# Patient Record
Sex: Male | Born: 1935 | Race: White | Hispanic: No | Marital: Married | Smoking: Former smoker
Health system: Southern US, Community
[De-identification: ages and names within clinical notes are randomized; demographics above are authoritative.]

## PROBLEM LIST (undated history)

## (undated) DIAGNOSIS — I2510 Atherosclerotic heart disease of native coronary artery without angina pectoris: Secondary | ICD-10-CM

## (undated) DIAGNOSIS — G629 Polyneuropathy, unspecified: Secondary | ICD-10-CM

## (undated) DIAGNOSIS — I350 Nonrheumatic aortic (valve) stenosis: Secondary | ICD-10-CM

## (undated) DIAGNOSIS — I447 Left bundle-branch block, unspecified: Secondary | ICD-10-CM

## (undated) DIAGNOSIS — I6529 Occlusion and stenosis of unspecified carotid artery: Secondary | ICD-10-CM

## (undated) DIAGNOSIS — E785 Hyperlipidemia, unspecified: Secondary | ICD-10-CM

## (undated) DIAGNOSIS — I701 Atherosclerosis of renal artery: Secondary | ICD-10-CM

## (undated) DIAGNOSIS — Z85819 Personal history of malignant neoplasm of unspecified site of lip, oral cavity, and pharynx: Secondary | ICD-10-CM

## (undated) DIAGNOSIS — E039 Hypothyroidism, unspecified: Secondary | ICD-10-CM

## (undated) DIAGNOSIS — I509 Heart failure, unspecified: Secondary | ICD-10-CM

## (undated) DIAGNOSIS — R0602 Shortness of breath: Secondary | ICD-10-CM

## (undated) DIAGNOSIS — D649 Anemia, unspecified: Secondary | ICD-10-CM

## (undated) DIAGNOSIS — K5790 Diverticulosis of intestine, part unspecified, without perforation or abscess without bleeding: Secondary | ICD-10-CM

## (undated) DIAGNOSIS — I10 Essential (primary) hypertension: Secondary | ICD-10-CM

## (undated) DIAGNOSIS — Z7901 Long term (current) use of anticoagulants: Secondary | ICD-10-CM

## (undated) DIAGNOSIS — C140 Malignant neoplasm of pharynx, unspecified: Secondary | ICD-10-CM

## (undated) DIAGNOSIS — H9190 Unspecified hearing loss, unspecified ear: Secondary | ICD-10-CM

## (undated) DIAGNOSIS — K449 Diaphragmatic hernia without obstruction or gangrene: Secondary | ICD-10-CM

## (undated) DIAGNOSIS — I4892 Unspecified atrial flutter: Secondary | ICD-10-CM

## (undated) DIAGNOSIS — I219 Acute myocardial infarction, unspecified: Secondary | ICD-10-CM

## (undated) DIAGNOSIS — K219 Gastro-esophageal reflux disease without esophagitis: Secondary | ICD-10-CM

## (undated) DIAGNOSIS — K635 Polyp of colon: Secondary | ICD-10-CM

## (undated) DIAGNOSIS — I739 Peripheral vascular disease, unspecified: Secondary | ICD-10-CM

## (undated) DIAGNOSIS — I4891 Unspecified atrial fibrillation: Secondary | ICD-10-CM

## (undated) DIAGNOSIS — I255 Ischemic cardiomyopathy: Secondary | ICD-10-CM

## (undated) HISTORY — PX: TONSILLECTOMY: SUR1361

## (undated) HISTORY — PX: TRACHEOSTOMY: SUR1362

## (undated) HISTORY — PX: CARDIAC CATHETERIZATION: SHX172

## (undated) HISTORY — DX: Acute myocardial infarction, unspecified: I21.9

## (undated) HISTORY — PX: CORONARY ARTERY BYPASS GRAFT: SHX141

## (undated) HISTORY — DX: Peripheral vascular disease, unspecified: I73.9

## (undated) HISTORY — PX: RENAL ARTERY STENT: SHX2321

## (undated) HISTORY — PX: APPENDECTOMY: SHX54

---

## 1999-05-31 ENCOUNTER — Other Ambulatory Visit: Admission: RE | Admit: 1999-05-31 | Discharge: 1999-05-31 | Payer: Self-pay | Admitting: Urology

## 2000-10-04 ENCOUNTER — Ambulatory Visit (HOSPITAL_COMMUNITY): Admission: RE | Admit: 2000-10-04 | Discharge: 2000-10-04 | Payer: Self-pay | Admitting: Gastroenterology

## 2003-04-06 ENCOUNTER — Ambulatory Visit (HOSPITAL_COMMUNITY): Admission: RE | Admit: 2003-04-06 | Discharge: 2003-04-06 | Payer: Self-pay | Admitting: Ophthalmology

## 2003-04-14 ENCOUNTER — Ambulatory Visit (HOSPITAL_COMMUNITY): Admission: RE | Admit: 2003-04-14 | Discharge: 2003-04-14 | Payer: Self-pay | Admitting: Ophthalmology

## 2006-02-05 ENCOUNTER — Emergency Department (HOSPITAL_COMMUNITY): Admission: EM | Admit: 2006-02-05 | Discharge: 2006-02-06 | Payer: Self-pay | Admitting: Emergency Medicine

## 2006-02-11 ENCOUNTER — Inpatient Hospital Stay (HOSPITAL_COMMUNITY): Admission: EM | Admit: 2006-02-11 | Discharge: 2006-02-15 | Payer: Self-pay | Admitting: Emergency Medicine

## 2006-02-26 ENCOUNTER — Emergency Department (HOSPITAL_COMMUNITY): Admission: EM | Admit: 2006-02-26 | Discharge: 2006-02-26 | Payer: Self-pay | Admitting: Emergency Medicine

## 2006-03-21 ENCOUNTER — Ambulatory Visit (HOSPITAL_COMMUNITY): Admission: RE | Admit: 2006-03-21 | Discharge: 2006-03-21 | Payer: Self-pay | Admitting: Emergency Medicine

## 2006-04-17 ENCOUNTER — Ambulatory Visit: Payer: Self-pay | Admitting: Pulmonary Disease

## 2006-04-17 ENCOUNTER — Encounter: Admission: RE | Admit: 2006-04-17 | Discharge: 2006-04-17 | Payer: Self-pay | Admitting: Emergency Medicine

## 2006-10-21 ENCOUNTER — Inpatient Hospital Stay (HOSPITAL_COMMUNITY): Admission: RE | Admit: 2006-10-21 | Discharge: 2006-10-22 | Payer: Self-pay | Admitting: Interventional Cardiology

## 2006-11-26 ENCOUNTER — Ambulatory Visit: Payer: Self-pay | Admitting: Vascular Surgery
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
## 2008-12-25 ENCOUNTER — Inpatient Hospital Stay (HOSPITAL_COMMUNITY): Admission: EM | Admit: 2008-12-25 | Discharge: 2008-12-28 | Payer: Self-pay | Admitting: Internal Medicine

## 2009-05-23 ENCOUNTER — Encounter: Admission: RE | Admit: 2009-05-23 | Discharge: 2009-05-23 | Payer: Self-pay | Admitting: Interventional Cardiology

## 2010-01-07 ENCOUNTER — Emergency Department (HOSPITAL_COMMUNITY): Admission: EM | Admit: 2010-01-07 | Discharge: 2010-01-07 | Payer: Self-pay | Admitting: Emergency Medicine

## 2010-09-02 LAB — COMPREHENSIVE METABOLIC PANEL
ALT: 20 U/L (ref 0–53)
Alkaline Phosphatase: 44 U/L (ref 39–117)
CO2: 26 mEq/L (ref 19–32)
Calcium: 8.9 mg/dL (ref 8.4–10.5)
Chloride: 101 mEq/L (ref 96–112)
GFR calc non Af Amer: >60 mL/min (ref >60)
Glucose, Bld: 137 mg/dL — ABNORMAL HIGH (ref 70–99)
Potassium: 3.9 mEq/L (ref 3.5–5.1)
Sodium: 135 mEq/L (ref 135–145)
Total Bilirubin: 0.5 mg/dL (ref 0.3–1.2)

## 2010-09-02 LAB — CBC
Hemoglobin: 11.6 g/dL — ABNORMAL LOW (ref 13.0–17.0)
MCH: 32 pg (ref 26.0–34.0)
MCHC: 34.3 g/dL (ref 30.0–36.0)
MCV: 93.4 fL (ref 78.0–100.0)

## 2010-09-02 LAB — D-DIMER, QUANTITATIVE: D-Dimer, Quant: 0.39 ug/mL-FEU (ref 0.00–0.48)

## 2010-09-02 LAB — DIFFERENTIAL
Basophils Absolute: 0.2 10*3/uL — ABNORMAL HIGH (ref 0.0–0.1)
Basophils Relative: 2 % — ABNORMAL HIGH (ref 0–1)
Eosinophils Absolute: 0.1 10*3/uL (ref 0.0–0.7)
Eosinophils Relative: 1 % (ref 0–5)
Neutrophils Relative %: 73 % (ref 43–77)

## 2010-09-02 LAB — TROPONIN I: Troponin I: 0.01 ng/mL (ref 0.00–0.06)

## 2010-09-02 LAB — CK TOTAL AND CKMB (NOT AT ARMC): Relative Index: INVALID (ref 0.0–2.5)

## 2010-09-24 LAB — BASIC METABOLIC PANEL
BUN: 26 mg/dL — ABNORMAL HIGH (ref 6–23)
CO2: 26 mEq/L (ref 19–32)
Calcium: 8 mg/dL — ABNORMAL LOW (ref 8.4–10.5)
Calcium: 8.4 mg/dL (ref 8.4–10.5)
Chloride: 104 mEq/L (ref 96–112)
Creatinine, Ser: 1.03 mg/dL (ref 0.4–1.5)
Creatinine, Ser: 1.05 mg/dL (ref 0.4–1.5)
GFR calc Af Amer: >60 mL/min (ref >60)
GFR calc Af Amer: >60 mL/min (ref >60)
GFR calc non Af Amer: >60 mL/min (ref >60)
Glucose, Bld: 105 mg/dL — ABNORMAL HIGH (ref 70–99)
Potassium: 4.2 mEq/L (ref 3.5–5.1)
Sodium: 140 mEq/L (ref 135–145)

## 2010-09-24 LAB — COMPREHENSIVE METABOLIC PANEL
AST: 29 U/L (ref 0–37)
Albumin: 3.4 g/dL — ABNORMAL LOW (ref 3.5–5.2)
Alkaline Phosphatase: 38 U/L — ABNORMAL LOW (ref 39–117)
BUN: 20 mg/dL (ref 6–23)
CO2: 27 mEq/L (ref 19–32)
Chloride: 100 mEq/L (ref 96–112)
Creatinine, Ser: 1.04 mg/dL (ref 0.4–1.5)
GFR calc non Af Amer: >60 mL/min (ref >60)
Potassium: 3.9 mEq/L (ref 3.5–5.1)
Total Bilirubin: 0.8 mg/dL (ref 0.3–1.2)

## 2010-09-24 LAB — CBC
HCT: 28.6 % — ABNORMAL LOW (ref 39.0–52.0)
HCT: 29.5 % — ABNORMAL LOW (ref 39.0–52.0)
HCT: 34.8 % — ABNORMAL LOW (ref 39.0–52.0)
Hemoglobin: 10.1 g/dL — ABNORMAL LOW (ref 13.0–17.0)
Hemoglobin: 9.8 g/dL — ABNORMAL LOW (ref 13.0–17.0)
MCHC: 35 g/dL (ref 30.0–36.0)
MCV: 92 fL (ref 78.0–100.0)
MCV: 92.3 fL (ref 78.0–100.0)
Platelets: 161 10*3/uL (ref 150–400)
RBC: 3.32 MIL/uL — ABNORMAL LOW (ref 4.22–5.81)
RBC: 3.78 MIL/uL — ABNORMAL LOW (ref 4.22–5.81)
WBC: 17.2 10*3/uL — ABNORMAL HIGH (ref 4.0–10.5)
WBC: 18.1 10*3/uL — ABNORMAL HIGH (ref 4.0–10.5)
WBC: 19.7 10*3/uL — ABNORMAL HIGH (ref 4.0–10.5)
WBC: 9.1 10*3/uL (ref 4.0–10.5)

## 2010-09-24 LAB — GLUCOSE, CAPILLARY
Glucose-Capillary: 173 mg/dL — ABNORMAL HIGH (ref 70–99)
Glucose-Capillary: 217 mg/dL — ABNORMAL HIGH (ref 70–99)
Glucose-Capillary: 246 mg/dL — ABNORMAL HIGH (ref 70–99)
Glucose-Capillary: 273 mg/dL — ABNORMAL HIGH (ref 70–99)

## 2010-09-24 LAB — URINALYSIS, ROUTINE W REFLEX MICROSCOPIC
Glucose, UA: 250 mg/dL — AB
Ketones, ur: 15 mg/dL — AB
Protein, ur: NEGATIVE mg/dL

## 2010-09-24 LAB — CK TOTAL AND CKMB (NOT AT ARMC): CK, MB: 0.8 ng/mL (ref 0.3–4.0)

## 2010-09-24 LAB — HEMOGLOBIN A1C
Hgb A1c MFr Bld: 7.8 % — ABNORMAL HIGH (ref 4.6–6.1)
Mean Plasma Glucose: 177 mg/dL

## 2010-09-24 LAB — DIFFERENTIAL
Basophils Absolute: 0.2 10*3/uL — ABNORMAL HIGH (ref 0.0–0.1)
Eosinophils Absolute: 0 10*3/uL (ref 0.0–0.7)
Lymphocytes Relative: 1 % — ABNORMAL LOW (ref 12–46)
Lymphs Abs: 0.2 10*3/uL — ABNORMAL LOW (ref 0.7–4.0)
Monocytes Relative: 5 % (ref 3–12)
Neutro Abs: 16.8 10*3/uL — ABNORMAL HIGH (ref 1.7–7.7)
WBC Morphology: INCREASED

## 2010-09-24 LAB — CULTURE, BLOOD (SINGLE): Culture: NO GROWTH

## 2010-10-31 NOTE — Discharge Summary (Signed)
[name], [name] NO.:  [id number]   MEDICAL RECORD NO.:  [id number]          PATIENT TYPE:  INP   LOCATION:  5529                         FACILITY:  [facility]   PHYSICIAN:  Hollice Espy, M.D.DATE OF BIRTH:  07-25-1922   DATE OF ADMISSION:  12/25/2008  DATE OF DISCHARGE:  12/28/2008                               DISCHARGE SUMMARY   PRIMARY CARE PHYSICIAN:  Hal T. Stoneking, MD   DISCHARGE DIAGNOSES:  1. Lower lobe pneumonia.  2. Diabetes mellitus, uncontrolled.  3. Anemia, more likely chronic disease.  4. History of coronary artery disease.  5. History of hypertension.   DISCHARGE MEDICATIONS:  For this patient are as follows:  Avelox 400 mg  p.o. daily x5 days.  He will continue the rest of his medications as  follows:  1. Metformin 500 mg p.o. b.i.d.  2. Glimepiride 4 p.o. b.i.d.  3. Aspirin 81 p.o. daily.  4. Coreg 25 p.o. daily.  5. Enalapril 20 p.o. daily, which will be restarted, enalapril      specifically will be restarted on Friday, July 16.  6. Plavix 75 p.o. daily.  7. Synthroid 50 mcg p.o. daily.  8. Zocor 40 p.o. daily.   HOSPITAL COURSE:  The patient is an 77 year old white male, past medical  history of CAD and diabetes, who presented with weakness and mild  confusion.  He denied any nausea, vomiting, abdominal pain, or shortness  of breath.  When he came in, he was referred by his PCP, sent over, he  was evaluated and found to have a white count of 19,000.  Chest x-ray  noting a left lower lobe pneumonia.  Initially, he was put on Ceptaz,  but his white count actually went up, he was then changed over Avelox,  and his white count came down to 17 as of July 12.  However, he became  markedly improved, stronger, able to tolerate p.o., ambulate well  without PT/OT assistance and by July 13, his white count had normalized  to 9.  The rest of his labs, otherwise, were stable.  Initially, his  hemoglobin dropped slightly to as low as 9.2  on July 12, but then came  up on its own to 10.1, his renal function all looked to be normal.  His  blood pressures prior to getting his medications on July 13 with  systolic of 130, heart rate of 70, I recommended then that he hold his  enalapril until July 16 and continue his Coreg.  Rest of his medical  issues, he was otherwise stable.  His A1c was noted to 7.8, but we will  defer to his PCP in terms of diabetic medication adjustment.   DISPOSITION:  Improved.   ACTIVITY:  Slowly increased.   DISCHARGE DIET:  Heart-healthy, carb modified.   He is being discharged to home.  Follow up with PCP, Dr. Merlene Laughter  in 1 week's time.      Hollice Espy, M.D.  Electronically Signed     SKK/MEDQ  D:  12/28/2008  T:  12/29/2008  Job:  [id number]   cc:  Hal T. Stoneking, M.D.

## 2010-10-31 NOTE — H&P (Signed)
[name]  [name], [name] NO.:  [id number]   MEDICAL RECORD NO.:  [id number]          PATIENT TYPE:  INP   LOCATION:  3740                         FACILITY:  [facility]   PHYSICIAN:  Georgina Quint. Plotnikov, MDDATE OF BIRTH:  1923/01/11   DATE OF ADMISSION:  12/25/2008  DATE OF DISCHARGE:                              HISTORY & PHYSICAL   CHIEF COMPLAINT:  Weak and confused.   HISTORY OF PRESENT ILLNESS:  The patient is an 77 year old male who was  seen earlier at [facility] by Dr. Laurine Blazer and suspected of having  sepsis.  He has been not quite himself starting this morning with  excessive sleepiness during daytime and some mild confusion according to  wife.  He felt very weak.  There was no fever or chills.  He denied  having nausea, vomiting, abdominal pain, chest pain, or shortness of  breath.  He did have a little bit of cough, apparently nonproductive.  No one else is sick around him.  There has been no diarrhea.   PAST MEDICAL HISTORY:  1. Hypertension.  2. Diabetes.  3. Hiatal hernia.  4. Coronary artery disease.  5. Hypothyroidism.   PAST SURGICAL HISTORY:  Remote surgery on larynx apparently for cancer.   ALLERGIES:  None.   CURRENT MEDICATIONS:  1. Glipizide 10 mg b.i.d.  2. Finasteride 5 mg daily.  3. Carvedilol 25 mg b.i.d.  4. Acarbose 100 mg 3 times a day.  5. Aspirin 81 mg daily.  6. Levoxyl 50 mcg daily.  7. Plavix 75 mg daily.  8. Lisinopril/hydrochlorothiazide 20/12.5 mg daily.  9. Vytorin 10/40 one daily.  10.Amlodipine 5 mg daily.  11.Metformin 500 mg daily.   FAMILY HISTORY:  Positive for hypertension, coronary disease.   SOCIAL HISTORY:  He has not smoked or drank in 50 years.  He is married.  He is physically very active.   REVIEW OF SYSTEMS:  No chest pain or shortness of breath.  No syncope.  Some dry cough over past couple days.  According to wife, he has not  been feeling well for 2 days; however, he admits only to feeling  bad  today.  Somnolent today, some nonspecific confusion according to wife.  No urinary symptoms.  No diarrhea.  Rest of the 14-point review of  systems is as above or negative.   PHYSICAL EXAMINATION:  VITAL SIGNS:  Blood pressure 120/46, temperature  98, respirations 16, heart rate 86.  GENERAL:  Elderly male in no acute distress.  He is nontoxic appearing.  HEENT:  With coated tongue, slightly dry.  NECK:  Supple.  No meningeal signs.  Mild carotid bruit bilaterally.  Tracheostomy opening is present.  CHEST:  Without deformities.  HEART:  S1 and S2.  No gallop, grade 1/6 murmur.  LUNGS:  Clear with somewhat decreased breath sounds at bases.  No  wheezes or rales.  ABDOMEN:  Soft, nontender.  No organomegaly or mass felt.  EXTREMITIES:  No edema.  Calves nontender.  NEUROLOGIC:  He is alert and cooperative.  Cranial nerves II through XII  nonfocal.  Deep tendinous  muscle strength within normal limits.  No  meningeal signs.  SKIN:  Clear of rashes.   LABORATORY DATA:  Labs from Urgent Care with WBC 19,000, hemoglobin  12.2, platelets 216.   ASSESSMENT/PLAN:  1. Weakness, somnolence, and possibly mild confusion with elevated WBC      count, possible early sepsis.  We will cover with ceftazidime IV,      obtain additional lab work, chest x-ray, blood cultures.  2. Diabetes mellitus, start on sliding scale.  Continue current oral      meds.  3. Hypertension on therapy.  4. Coronary artery disease, asymptomatic at present.  5. Hiatal hernia.  We will use Protonix.  6. Deep venous thrombosis prophylaxis.      Georgina Quint. Plotnikov, MD  Electronically Signed     Georgina Quint. Plotnikov, MD  Electronically Signed    AVP/MEDQ  D:  12/25/2008  T:  12/26/2008  Job:  [id number]   cc:   Hal T. Stoneking, M.D.

## 2010-10-31 NOTE — Consult Note (Signed)
VASCULAR SURGERY CONSULTATION   [name], [name]  DOB:  02-18-1923                                       11/26/2006  [id number]#:[id number]   HISTORY OF PRESENT ILLNESS:  This patient is an 77 year old male patient  referred by Dr. Eldridge Dace for lower extremity claudication symptoms.  This patient recently underwent bilateral renal artery stenting by Dr.  Eldridge Dace on May 6, and at that time underwent lower extremity  angiography and was found to have severe femoral popliteal and tibial  occlusive disease bilaterally.  I reviewed these studies today.  He does  have lower extremity claudication type symptoms.  He is able to walk  about one half mile, he states, on the treadmill, at which time he must  stop because of calf discomfort.  He rests for about 4-5 minutes and  progresses, but is not able to walk as far the second time.  He has had  no rest pain, nonhealing ulcers or infections in the lower extremities  suggesting potential limb loss concerns at this time.  He has had  coronary artery bypass grafting by Dr. Tyrone Sage in 1997 and has had all  saphenous vein in both legs removed from the ankles to the distal thigh  on the right and to the knee on the left.  He denies any chest pain,  dyspnea on exertion, PND or orthopnea presently, but did have a mild  myocardial infarction November 2007.  He also denies any neurologic  symptoms such as any paresis, aphagia, amaurosis  fugax, diplopia,  blurred vision or syncope.   PAST MEDICAL HISTORY:  1. Hypertension.  2. Coronary artery disease.  3. Hyperlipidemia.  4. Diabetes mellitus.  5. 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   ALLERGIES:  None known.   REVIEW OF SYSTEMS/MEDICATIONS:  Please see Health and History form.   PHYSICAL EXAMINATION:  VITAL SIGNS:  Blood pressure 194/79, heart rate  72, respirations 14.  GENERAL:  He is an elderly male patient who is in no apparent distress.  Alert and oriented x3.  NECK:  Supple with 3+ carotid pulses palpable.  No bruits are audible on  the right.  There is a soft bruit on the left.  NEUROLOGICAL:  Normal.  EXTREMITIES:  Upper extremity pulses are 3+ bilaterally.  CHEST:  Clear to auscultation.  CARDIOVASCULAR:  Regular rhythm, no murmurs.  ABDOMEN:  Soft, nontender with no masses.  EXTREMITIES:  He has 2-3+ femoral pulses bilaterally.  Both feet are  adequatelyperfused without palpable distal pulses.  There is no evidence  of infection or ulceration or gangrene.   STUDIES:  Lower extremity arterial Dopplers in our office today revealed  ABI of 0.76 on the right and 0.72 on the left secondary to femoral,  popliteal and tibial disease.   I reviewed his angiograms which revealed severe superficial femoral  disease bilaterally with diseased popliteal arteries in both legs and  only one vessel runoff in each leg with severe tibial disease  bilaterally.   Because he is not in a limb loss situation and he also has no abundant  saphenous vein remaining, I do not think he should have  revascularization at this time unless his symptoms worsen and become  more of a limb threatening situation.  I discussed that with him and he  is in total agreement.  If his symptoms worsen, he will be in touch with  [place] for further evaluation.   Quita Skye Hart [place], M.D.  Electronically Signed  JDL/MEDQ  D:  11/26/2006  T:  11/27/2006  Job:  [id number]   cc:   Corky Crafts, MD

## 2010-11-03 NOTE — Cardiovascular Report (Signed)
[name]  FORD, PEDDIE NO.:  [id number]   MEDICAL RECORD NO.:  [id number]          PATIENT TYPE:  INP   LOCATION:  3737                         FACILITY:  [facility]   PHYSICIAN:  Corky Crafts, MDDATE OF BIRTH:  [birth year]   DATE OF PROCEDURE:  02/12/2006  DATE OF DISCHARGE:                              CARDIAC CATHETERIZATION   REFERRING PHYSICIAN:  Dr. Corliss Marcus.   PROCEDURES PERFORMED:  Left heart catheterization, left ventriculogram,  coronary angiogram, saphenous vein graft angiography, abdominal aortogram  and PCI of the SVG to OM1, OM-2.   OPERATOR:  Corky Crafts, MD.   INDICATIONS:  Non-ST elevation MI.   DESCRIPTION OF PROCEDURE:  The risks and benefits of cardiac catheterization  were explained to the patient and informed consent was obtained.  The  patient was brought to the cath lab and placed on the table.  He was prepped  and draped in the usual sterile fashion.  His right groin was infiltrated  with 1% lidocaine.  A 6-French arterial sheath was placed into the right  femoral artery using the modified Seldinger technique.  Left coronary artery  angiography was performed using a JL-4 catheter.  The catheter was advanced  through the vessel ostium under fluoroscopic guidance.  Digital angiography  was performed in multiple projections using hand injection of contrast right  coronary artery angiography was then performed using a JR-4 catheter.  The  catheter was advanced to the ascending aorta and into the vessel ostium  under fluoroscopic guidance.  Digital angiography was performed in multiple  projections using hand injection of contrast.  Saphenous vein graft  angiography was performed using a JR-4 catheter.  Digital angiography was  performed in multiple projections using hand injection of contrast.  A left  ventriculogram was subsequently performed.  A pigtail catheter was advanced  through the ascending aorta and across the aortic  valve under fluoroscopic  guidance.  Hemodynamic pressure assessment was performed.  Power injection  of contrast was also performed in the 30 degree RAO position.  A pullback  was then done under continuous hemodynamic pressure monitoring.  The  catheter was pulled back to the abdominal aorta.  Power injection of  contrast done on the AP projection to visualize the renal arteries and  infrarenal abdominal aorta. The PCI was then performed.  Please see below  for details.  The sheath will be removed using manual compression.   FINDINGS:  The left main is patent.  There is a 40% distal stenosis.  The  left anterior descending is occluded proximally.  Circumflex is patent with  diffuse atherosclerosis up to 50-60% distally.  Competitive flow was noted  in the distal vessel.  The right coronary artery is occluded proximally.  The SVG to OM1, OM-2 is a jump graft.  There is a 99% lesion in the mid  vessel which is hazy.  There is a distal 70% lesion after a ectatic segment.  This 70% lesion extends close to the anastomosis with the OM-1.  The SVG to  LAD/diagonal is widely patent.  The left anterior descending is  a diffusely  diseased vessel.  The SVG to PDA/distal circumflex is widely patent.   HEMODYNAMIC RESULTS:  Left ventricular pressure 161/1 with LVEDP of 8 mmHg.  Aortic pressure 161/61 with a mean aortic pressure of 100.  The left  ventriculogram shows anterolateral hypokinesis with an estimated ejection  fraction of 40-45%.  The abdominal aorta aortogram shows no infrarenal  abdominal aortic aneurysm.  There are single renal arteries bilaterally.  There is a 70% left renal artery stenosis and a 70% right renal artery  stenosis as well.   PERCUTANEOUS CORONARY INTERVENTION NARRATIVE:  A JR-4 guiding catheter was  advanced through the ostium of the SVG to OM1, OM-2.  The Prowater wire was  advanced across the lesion.  An export catheter was advanced to the area of  the 99% thrombotic  lesion and aspiration was attempted.  There was no  successful thrombus aspirated.  There was no change in the appearance of the  lesion.  The flow remained TIMI-2 and the lesion remained 99%.  The decision  was then made to continue anticoagulation and try to use proximal embolic  protection for a planned PCI the following day.   IMPRESSION:  1. A 99% mid vessel lesion in the saphenous vein graft to first obtuse      marginal, second obtuse marginal.  This appears to be culprit lesion.      There is also diffuse distal disease in that vein graft.  2. Decreased ventricular function with ejection fraction of 45%.  3. Bilateral renal artery stenosis.   RECOMMENDATIONS:  Will start Integrilin, continue Lovenox for 24 hours.  We  will then plan for PCI on Wednesday with a Proxis embolic protection device.  Given the amount of distal disease in the graft, I think this is a safer  option than a filter wire.  1. Continue aspirin and Plavix.  2. The patient will be monitored in the CCU.      Corky Crafts, MD  Electronically Signed     JSV/MEDQ  D:  02/14/2006  T:  02/15/2006  Job:  [id number]   cc:   Francisca December, M.D.

## 2010-11-03 NOTE — Procedures (Signed)
[facility]  Patient:    [name], [name]                        MRN: [id number] Proc. Date: 10/04/00 Adm. Date:  [id number] Disc. Date: [id number] Attending:  Orland Mustard CC:         Reuben Likes, M.D.   Procedure Report  PROCEDURE:  Colonoscopy.  MEDICATIONS:  Fentanyl 50 mcg, Versed 5 mg IV.  SCOPE:  Pediatric video Olympus colonoscope.  INDICATIONS FOR PROCEDURE:  The patient had a previous colonoscopy with an adenomatous polyp removed. This is done as a three year follow-up.  DESCRIPTION OF PROCEDURE:  The procedure had been explained to the patient and consent obtained. With the patient in the left lateral decubitus position, the Olympus pediatric video colonoscope was used as no other scope was available. We were able to advance without difficulty to the hepatic flexure rolling the patient on the supine down to the cecum. The ileocecal valve and appendiceal orifice were identified. The scope was withdrawn. The cecum, ascending colon, hepatic flexure, transverse colon, splenic flexure, descending and sigmoid colon were seen well upon removal. No polyps or other lesions were seen. The sigmoid colon did reveal moderate diverticulosis. This area was also free of polyps as was the rectum. The scope was withdrawn. The patient tolerated the procedure well.  ASSESSMENT: 1. No evidence of further colonic polyps. 2. Diverticulosis.  PLAN:  Will recommend repeating in five years with routine yearly Hemoccults, etc. DD:  10/04/00 TD:  10/04/00 Job: [id number]

## 2010-11-03 NOTE — Discharge Summary (Signed)
[name], [name] NO.:  [id number]   MEDICAL RECORD NO.:  [id number]          PATIENT TYPE:  INP   LOCATION:  3737                         FACILITY:  [facility]   PHYSICIAN:  Elliot Cousin, M.D.    DATE OF BIRTH:  02-25-1923   DATE OF ADMISSION:  02/11/2006  DATE OF DISCHARGE:  02/15/2006                                 DISCHARGE SUMMARY   DISCHARGE DIAGNOSES:  1. Non-ST-elevation myocardial infarction.  2. Coronary artery disease status post successful drug-eluting stents x2      placed in the saphenous vein graft to the obtuse marginal #1 and obtuse      marginal #2 graft.  3. History of six-vessel coronary artery bypass grafting in 1997.  4. Left ventricular systolic dysfunction with an ejection fraction ranging      from 40-45% per cardiac catheterization on August28,2007.  5. Bilateral renal artery stenosis, 70% stenosis of the right and left      renal arteries her cardiac catheterization on August28,2007.  6. Type 2 diabetes mellitus.  Hemoglobin A1c was 7.6 during      hospitalization.  7. Hypertension.  8. Hypothyroidism.  9. Hypokalemia.   For the Secondary Discharge Diagnoses, please see the dictated History and  Physical.   DISCHARGE MEDICATIONS:  1. Nitroglycerin patch 0.6 mg, apply in the morning and take off in the      evening  2. Enalapril 10 mg 1/2 tablet b.i.d.  3. Potassium chloride 20 mEq daily for three more days.  4. Aspirin 81 mg daily.  5. Metformin 500 mg b.i.d. (Do not take until September2,2007.)  6. Glipizide 10 mg b.i.d.  7. Norvasc 10 mg daily.  8. Doxazosin 8 mg daily.  9. Levothyroxine 50 mcg daily.  10.Metoprolol 50 mg b.i.d.  11.Plavix 75 mg daily.  12.Zocor 5 mg daily.   DISCHARGE DISPOSITION:  The patient was discharged to home in improved and  stable condition on August31,2007. He was advised to follow up with Dr.  Amil Amen as scheduled in 2 weeks.  He was also advised to follow up with Dr.  Leslee Home in 1  week.  The patient will need to have a BMET and CBC drawn.   CONSULTATIONS:  1. Dr. Corliss Marcus  2. Dr. Lance Muss.   PROCEDURE PERFORMED:  1. Left heart catheterization, left ventriculogram, coronary angiogram,      saphenous vein graft angiography, abdominal aortogram and PCI of the      saphenous vein graft to the OM-1, OM-2 on August28,2007.  2. Status post percutaneous coronary intervention of his saphenous vein      graft to the obtuse marginal #1, obtuse marginal #2, with embolic      protection and drug-eluting stent placements, aspiration thrombectomy.   HISTORY OF PRESENT ILLNESS:  The patient is an 77 year old man with a past  medical history significant for coronary artery disease, diabetes mellitus,  and laryngeal cancer, who presented to the emergency department on  August27,2007, with a chief complaint of chest pain.  Given the patient's  risk factors, he was admitted for further evaluation and  management.   For additional details, please see the dictated History and Physical.   HOSPITAL COURSE:  #1.  NON-ST ELEVATION MYOCARDIAL INFARCTION, CORONARY ARTERY DISEASE, LEFT  VENTRICULAR DYSFUNCTION:  On admission, the patient was hemodynamically  stable.  His EKG revealed a stable left bundle branch block.  His initial  point-of-care cardiac markers were completely negative.  His chest x-ray  revealed no acute findings.  Although the patient's presentation appeared to  be somewhat atypical, he was started on management for unstable angina.  Therefore, the patient was started on a nitroglycerin drip and a heparin  drip.  His chronic medications including aspirin, metoprolol, Plavix, and  Zocor were all continued.  Cardiac enzymes were ordered q.8 h x3.  His  cardiologist, Dr. Amil Amen, was consulted. Dr. Amil Amen provided the  consultation and made a few changes in the medication regimen.  The  following morning, the patient's cardiac enzymes ruled in.  The   patient  subsequently underwent a cardiac catheterization by Dr. Eldridge Dace.  The  cardiac catheterization revealed a 99% mid vessel lesion in the saphenous  vein graft to the first obtuse marginal and second obtuse marginal.  His  ejection fraction was estimated to be 45%.  The catheterization also  revealed bilateral renal artery stenoses.  The patient was subsequently  started on Integrilin and continued on Lovenox for 24 hours.  The following  day, Dr. Eldridge Dace performed percutaneous coronary intervention with drug-  eluting stent placements in the saphenous vein graft to the obtuse marginal  #1 and obtuse marginal #2.  The patient tolerated the procedure well.   Following the cardiac procedure, the patient's cardiac enzymes actually  increased.  Prior to hospital discharge, his troponin I was elevated at  9.72, and his CK was elevated at 573.  The patient was hemodynamically  stable and was chest pain free at the time of hospital discharge.  He was,  therefore, cleared for discharge by the cardiology team.  He has an  appointment to follow up with Dr. Amil Amen in a couple of weeks.  He was  advised to continue his chronic cardiac medications.  A nitroglycerin patch  was added for treatment of angina.   #2.  TYPE 2 DIABETES MELLITUS:  The  patient's metformin was withheld during  hospitalization because of the cardiac catheterization.  He was maintained  on glipizide 10 mg b.i.d.  His capillary blood sugars became mildly  elevated, and, therefore,  sliding-scale NovoLog before meals  and nightly  and a small dose of Lantus were added.  His hemoglobin A1c was 7.6.  The  patient probably needs a bit more control of his diabetes.  He was not sent  home on insulin therapy.  He was advised to continue glipizide 10 mg b.i.d.  and to restart Glucophage at his previous dose on September2,2007.  Further management will be deferred to his primary care physician, Dr. Lorenz Coaster.   #3 HYPERTENSION:   The patient's blood pressure remained controlled during  the hospitalization on metoprolol, Norvasc, and enalapril.   #4.  HYPOTHYROIDISM:  The the patient was maintained on Synthroid 50 mcg  daily.  His TSH was within normal limits at 2.22.   #5.  MILD HYPOKALEMIA:  Prior to hospital discharge, the patient's serum  potassium fell to 3.4.  He was given potassium chloride prior to hospital  discharge and sent home on several more days of therapy.   #6.  DYSLIPIDEMIA:  The patient was maintained on Zocor during  hospitalization.  His fasting lipid profile revealed a total cholesterol of  138, triglycerides of 69, LDL of 91, and HDL of 33.   DISCHARGE LABORATORY DATA:  WBC 9.0, hemoglobin 10.9, hematocrit 31.9,  platelets 218.  Sodium 137, potassium 3.4, chloride 100, CO2 28, glucose  137, BUN 17, creatinine 1.2, calcium 8.0. Troponin I at 9.72.  CK 573, CK-MB  25.8, relative index 4.5.      Elliot Cousin, M.D.  Electronically Signed     DF/MEDQ  D:  02/15/2006  T:  02/17/2006  Job:  [id number]   cc:   Francisca December, M.D.  Reuben Likes, M.D.

## 2010-11-03 NOTE — Cardiovascular Report (Signed)
NAMEMarland Kitchen  [name], [name] NO.:  [id number]   MEDICAL RECORD NO.:  [id number]          PATIENT TYPE:  INP   LOCATION:  2807                         FACILITY:  [facility]   PHYSICIAN:  Corky Crafts, MDDATE OF BIRTH:  08-04-1922   DATE OF PROCEDURE:  10/21/2006  DATE OF DISCHARGE:                            CARDIAC CATHETERIZATION   REFERRING:  Francisca December, M.D. and Reuben Likes, M.D.   PROCEDURE PERFORMED:  1. Abdominal aortogram.  2. Bilateral selective renal angiogram.  3. Percutaneous transluminal angioplasty with stent bilateral renal      arteries and bilateral lower extremity artery runoff.   OPERATOR:  Corky Crafts, MD.   INDICATIONS:  Hypertension and claudication.   PROCEDURE NARRATIVE:  The risks and benefits of peripheral vascular  angiography were explained to the patient, and informed consent was  obtained. The patient was brought to the [facility] lab.  He was prepped and  draped in the usual sterile fashion.  His right groin was then  infiltrated with 1% lidocaine.  A 6-French arterial sheath was placed  into the right common femoral artery using the modified Seldinger  technique.  A short pigtail catheter was advanced to the abdominal  aorta, and a power injection of contrast was performed in the AP  projection.  A 6-French JR-4 guiding catheter was then used to engage  first the left renal artery.  Digital angiography was performed using  hand injection of contrast.  Then the same catheter was used to engage  the right renal artery.  Digital angiography was performed in the same  projection using hand injection of contrast.  Heparin was given  intravenously for anticoagulation. A stabilizer wire was placed across  the stenosis in the right renal artery.  A 5.0 x 15 Aviator balloon was  inflated to 6 atmospheres for 18 seconds.  A second inflation was  performed at 10 atmospheres for 45 seconds.  A 6.0 x 18 Genesis stent  was then placed  across the lesion and deployed at 12 atmospheres for 70  seconds.  The stent balloon was then withdrawn to the ostium and used to  flare the ostium.  It was inflated at 12 atmospheres for 30 seconds. A 7  x 15 mm Aviator balloon was then placed within the stent and used to  post dilate the stent to 10 atmospheres for 40 seconds.  A second  inflation was done at the ostium which began to flare the ostium to 10  atmospheres for 20 seconds.  There was an excellent angiographic result.  The wire was removed.   The guide was then redirected the left renal artery.  The same  stabilizer wire was placed across the lesion in the left renal artery.  A 5.0 x 15 mm Aviator balloon was then placed across the lesion. The  balloon was inflated to 12 atmospheres for 35 seconds.  A 6.0 x 18 mm  Genesis stent was then deployed across the lesion and deployed a 14  atmospheres for 45 seconds.  The stent was then post dilated with the  same 7.0  x 15 mm Aviator balloon within the stent.  It was inflated to  12 atmospheres for 30 seconds.  Repeat angiography showed that the  proximal stent was under deployed.  The balloon was placed back into the  vessel and used to flare the ostium of 14 atmospheres for 40 seconds.  There was an excellent angiographic result with normal flow to both  renal arteries.  A pigtail catheter was then placed back into the  abdominal aorta.  A power injection of contrast was performed in the AP  projection, and the lower extremity arterial circulation was visualized.   FINDINGS:  1. The abdominal aorta showed mild irregularities.  There was no      aneurysm.  2. The right renal artery had an ostial 90% stenosis.  3. The left renal artery had an ostial 70% stenosis.  4. The iliac arteries bilaterally were widely patent.  5. Looking at the right leg, the right common femoral artery was      widely patent.  The right superficial femoral artery showed 70%      proximal stenosis.  The  right profunda and femoral artery was      widely patent.  The right the mid right superficial femoral artery      was diffusely diseased.  There was a subtotal occlusion with some      collateral vessels.  The popliteal had moderate atherosclerosis.      The anterior tibial and peroneal arteries were occluded.  There was      single-vessel runoff via the right posterior tibial.  6. The left common femoral artery was widely patent.  The left      superficial femoral artery had a 90% proximal stenosis.  The left      profunda had a 90% stenosis.  The mid left superficial femoral      artery was occluded.  It was a fairly long occlusion and      reconstituted at the popliteal. The left posterior tibial trunk was      heavily diseased.  The left anterior tibial was occluded.  The left      peroneal was occluded.  There was one-vessel runoff to lower      extremity with the left posterior tibial artery.   IMPRESSION:  1. Significant bilateral renal artery stenosis. Successful stent      placement in both renal arteries with 6.0 x 18 mm Genesis stents to      both renal arteries. Both stents were postdilated with a 7.0 x 15      mm Aviator balloon, and both had a luminal diameter of greater than      7.  2. Significant vascular disease involving bilateral superficial      femoral arteries, the left profunda.  There were mid superficial      femoral artery occlusions bilaterally.  There is single-vessel      runoff to both lower extremities with patent but diseased posterior      tibial arteries to both lower extremities   RECOMMENDATIONS:  1. The patient will be watched overnight.  2. Will try to control his blood pressure using intravenous      hydralazine.  We may need to back off on some of his      antihypertensives if his blood pressure does drop significantly      after the procedure. 3. Continue aspirin and Plavix for now.  4. I will check a CBC in the morning.  5. Assuming that  the patient is stable tomorrow, will likely discharge      him in the morning.      Corky Crafts, MD     JSV/MEDQ  D:  10/21/2006  T:  10/21/2006  Job:  [id number]   cc:   Francisca December, M.D.  Reuben Likes, M.D.

## 2010-11-03 NOTE — Cardiovascular Report (Signed)
NAMEMarland Kitchen  [name], [name] NO.:  [id number]   MEDICAL RECORD NO.:  [id number]          PATIENT TYPE:  INP   LOCATION:  3737                         FACILITY:  [facility]   PHYSICIAN:  Corky Crafts, MDDATE OF BIRTH:  1922/12/25   DATE OF PROCEDURE:  02/13/2006  DATE OF DISCHARGE:                              CARDIAC CATHETERIZATION   REFERRING PHYSICIAN:  Dr. Corliss Marcus   PROCEDURES PERFORMED:  1. Percutaneous coronary intervention of saphenous vein graft to obtuse      marginal-1, obtuse marginal-2 with embolic protection.  2. Aspiration thrombectomy.  3. Realitic thrombectomy.   INDICATIONS:  Non-ST-segment elevation MI.   OPERATOR:  Dr. Eldridge Dace.   PROCEDURAL NARRATIVE:  Diagnostic catheterization had been performed the day  before revealing a 99% mid graft lesion in an SVG to OM-1 and OM-2.  There  was also more distal disease of up to 70-80%.  The risks and benefits of  cardiac catheterization were explained to the patient.  The patient was  brought to the catheterization laboratory.  He 7-French arterial sheath was  placed into the right femoral artery using modified Seldinger technique.  Lidocaine was used for local anesthesia.  Heparin and Integrilin were  continued for anticoagulation.  A JR-4.0 catheter was advanced to ascending  aorta and into the vessel ostium under fluoroscopic guidance.  A Prowater  wire was advanced across the lesion.  The Proxis embolic protection device  was positioned close to the lesion.  A 2.0 x 9-mm balloon was inflated to 8  atmospheres for 20 seconds without a significant change in its appearance.  The Proxis device was inflated for this, was subsequently deflated and blood  was aspirated to hopefully remove any embolic debris.  A 3.0 x 12-mm Taxus  stent was then advanced across the lesion in the distal part of the graft.  The Proxis device was inflated.  The stent was deployed at 16 atmospheres  for 60 seconds.  The  balloon was removed.  Multiple attempts were made to  aspirate through Proxis.  No blood would come back into the syringe.  An  aspiration catheter which fits through Proxis was then advanced.  This also  would not give [place] any blood return.  Saline was infused in a 1 mL to 1 mL  manner as blood was attempted to be aspirated.  There was no successful  aspiration of fluid.  The patient began to develop chest pain.  The Proxis  catheter would not follow out any other aspiration catheters to pass.  The  Proxis was then removed.  No flow was noted past the initial 99% stenosis.  A 3.0 x 12-mm Voyager was then insufflated to 10 atmospheres for 17 seconds  in that lesion.  There was still flow.  A catheter was then used with  successful aspiration of clot.  AngioJet was also used.  The patient did  have some hypotension and bradycardia.  Dopamine was started at that time.  The mid vessel lesion was then stented with a 3.0 x 12 Taxus to 16  atmospheres for 30 seconds.  Multiple doses of adenosine and Verapamil were  given through an infusion catheter.  Thrombus was also noted in the ostium  of the OM-1.  A wire was placed down the OM-1 and a 2.0 x 9-mm, Maverick  balloon was inflated to 6 atmospheres with successful resolution of the  thrombus.  The proximal area of the distal stent was then post dilated with  a 3.0 x 8-mm, PowerSail balloon because it was hanging in an aneurysm.  Eventually, flow improved to a level of TIMI-2 flow.  The patient's chest  pain resolved and he was hemodynamically stable.  His dopamine was stopped  during the procedure.   IMPRESSION:  1. Successful drug-eluting stent x2 placed in the saphenous vein graft to      obtuse marginal-1 and obtuse marginal-2 graft.  2. This procedure was complicated by no reflow likely due to distal      embolization despite the attempted use of embolic protection.  3. Successful percutaneous transluminal coronary angiography of the  obtuse      maginal-1.   RECOMMENDATIONS:  Continue Integrilin.  Will monitor in CCU.  The sheath  will be removed using manual compression.  Continue lipid lowering therapy  and beta-blockade along with other secondary prevention.  Will also continue  aspirin and Plavix indefinitely.      Corky Crafts, MD  Electronically Signed     JSV/MEDQ  D:  02/14/2006  T:  02/15/2006  Job:  [id number]   cc:   Francisca December, M.D.

## 2010-11-03 NOTE — Assessment & Plan Note (Signed)
[facility]                               PULMONARY OFFICE NOTE   [name], [name]                        MRN:          [id number]  DATE:04/17/2006                            DOB:          01-26-1923    REASON FOR CONSULTATION:  Dyspnea of 2 months' duration.   This is a very pleasant 77 year old gentleman, status post laryngectomy in  the past for laryngeal carcinoma, who presents for evaluation of the same.  The patient states that he had been doing very well until 2 months ago, when  he underwent stent placement due to a myocardial infarction.  The patient  states that initially he had difficulties with dyspnea after the stent was  placed.  He was placed on a p.r.n. albuterol inhaler, and now feels like he  is pretty much back to baseline.  He currently denies any cough, and states  that he really not had to use his albuterol hardly at all over the last  several days.  He feels that his exercise tolerance is now back to normal.   The patient states that he has no allergies.   PAST MEDICAL HISTORY:  Remarkable for:  1. Hypertension.  2. Coronary artery disease.  3. Status post myocardial infarction, most recently in August of 2007.  4. He also has hyperlipidemia.  5. Diabetes, type 2.  6. History of laryngectomy in the past for laryngeal cancer.  7. The patient is also status post CABG in the past.  8. Recently, in August, had angioplasty and stent placement.  9. Status post appendectomy.   CURRENT MEDICATIONS:  1. Furosemide.  2. Vytorin.  3. Levothyroxine.  4. Metformin.  5. Glipizide.  6. Enalapril.  7. Metoprolol.  8. Plavix.  9. Aspirin.  10.He also uses albuterol on a p.r.n. basis, 2 puffs q.4; however, has not      required this most recently as 3 days ago.   SOCIAL HISTORY:  He quit smoking approximately 50 years ago.  He did smoke  heavily prior to that.  He performed [employer] service for 2 years in the  [employer].   He did not see combat, however, was deployed to [place] and  [place].  He lives with his wife, [name].  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  He currently does odd jobs part-time.  He  did a total of 38 years at [employer], and he has had no recent travel.   FAMILY HISTORY:  Noncontributory.   REVIEW OF SYSTEMS:  As noted above, otherwise unremarkable.   PHYSICAL EXAMINATION:  Blood pressure 120/74, weight of 157 pounds,  temperature 98.1, pulse is 70.  Oxygen saturation is 98%.  GENERAL:  This is a well-developed, well-nourished, elderly gentleman who  looks younger than his stated age of 77 years.  He is status post  laryngectomy, he has a stoma, and he speaks by occluding the stoma.  HEENT:  Unremarkable.  NECK:  Remarkable for the laryngeal stoma, as noted previously.  He has no  JVD.  LUNGS:  Clear to auscultation bilaterally.  He is moving air very well.  CARDIAC:  Regular rate and rhythm, no rubs, murmurs or gallops.  EXTREMITIES:  No cyanosis, no clubbing, no edema noted.   We did review the patient's laboratory data.  He had a chest x-ray performed  at [facility] during his recent hospitalization, which revealed  only some mild lung hyperinflation.  We also did review all of the patient's  records from his recent admission to [facility].  It was noted that he had  left ventricular systolic dysfunction, with an ejection fraction of 40% on  an echocardiogram of August 28.  He also was diagnosed with a non-ST  elevation myocardial infarction.  He is status post drug-eluting stents x2,  placed in the saphenous vein graft to the obtuse marginal number 1, and  obtuse marginal number 2.  These were apparently the culprit on the  patient's MI.  Additionally, we did review spirometry performed at [facility], this was performed on the 4th of October, and the patient had  essentially a normal spirometry with marginally significant small  airways  improvement after bronchodilator, which was really overall unremarkable.   IMPRESSION:  1. Dyspnea, which is now resolving.  I suspect that this was due to      myocardial dysfunction following non-ST elevational myocardial      infarction.  The patient is likely recovering from his stunned      myocardium, and now probably has normalized his ejection fraction.  At      least this appears so on clinical basis.  2. The patient at present does not have evidence of pulmonary dysfunction      that may be aggravating his dyspnea.   PLAN:  1. The plan, therefore, will be to keep the patient using his albuterol on      a p.r.n. basis, as he rarely uses this and is currently asymptomatic      from his lung standpoint.  2. Followup will be in 2 months' time to monitor for his symptoms.  If he      continues to do well      as he is doing, I will discharge him back the care of his primary care      physician, Dr. Leslee Home.     Gailen Shelter, MD  Electronically Signed    CLG/MedQ  DD: 04/20/2006  DT: 04/21/2006  Job #: [id number]   cc:   Reuben Likes, M.D.  Francisca December, M.D.

## 2010-11-03 NOTE — Discharge Summary (Signed)
[name], [name] NO.:  [id number]   MEDICAL RECORD NO.:  [id number]          PATIENT TYPE:  INP   LOCATION:  6533                         FACILITY:  [facility]   PHYSICIAN:  Corky Crafts, MDDATE OF BIRTH:  06/14/1923   DATE OF ADMISSION:  10/21/2006  DATE OF DISCHARGE:  10/22/2006                               DISCHARGE SUMMARY   ADMISSION DIAGNOSES:  1. Bilateral renal artery stenosis.  2. Hypertension.   DISCHARGE DIAGNOSES:  1. Bilateral renal artery stenosis, status post bilateral renal artery      stents Oct 20, 2006.  2. Hypertension, believed to be secondary to bilateral renal artery      stenosis.  3. Coronary artery disease.  4. Peripheral vascular disease including superficial femoral artery      occlusions bilaterally and carotid artery disease as well as left      subclavian stenosis.  5. Dyslipidemia.  6. Diabetes mellitus.  7. History of throat carcinoma.  8. Anemia, stable.  9. History of claudication.   CONSULTATIONS:  None.   PROCEDURES:  Oct 21, 2006, abdominal aortogram, bilateral selective renal  angiogram, percutaneous transluminal angioplasty with stent bilateral  renal arteries, and bilateral lower extremity artery runoff   HOSPITAL COURSE:  [name] is an 77 year old male with a known  history of coronary artery disease, peripheral vascular disease,  dyslipidemia, and hypertension believed to be secondary to bilateral  renal artery stenosis.  He was admitted to the [facility] on  Oct 21, 2006, as an outpatient for an abdominal aortogram and bilateral  selective renal angiogram.  He received bilateral renal artery stents.  The procedure was performed by Dr. Everette Rank and was well tolerated  by the patient.  The patient was observed overnight.  His blood pressure  was controlled while in the hospital on IV hydralazine.  He is continued  on aspirin 81 mg daily and Plavix 75 mg daily.  On the following day,  the  patient had no complaints or symptoms.  His blood pressure prior to  being given medications was 172/56.  Upon discharge, his Diovan 160 mg  is being held.  He is being continued on enalapril 20 mg daily as well  as carvedilol 25 mg twice daily.  The plan is with the hope of being  able to wean him in the future off of some of his blood pressure  medications.  He is scheduled for a 2-week followup in the office with a  physician extender, and at that time, we will discuss his lower  extremity disease as well as vascular surgery.  He is being discharged  to home today in stable condition.  His groin is stable without evidence  of hematoma, bleeding, or pseudoaneurysm.  He was seen and examined by  Dr. Eldridge Dace who was in agreement with the discharge decision.  His  metformin was held 24 hours prior to the procedure and will continue to  be held 48 hours following the procedure.   LABORATORY DATA:  White blood count 7.4, hemoglobin 12.5, hematocrit  37.2, platelets 226,000, sodium 139,  potassium 4, chloride 105, CO2 30,  glucose 174, BUN 15, creatinine 0.99, uncorrected calcium 8.3.   X-rays:  Two-view x-ray on Oct 21, 2006, revealed no acute findings.   EKG:  12-lead EKG on Oct 21, 2006, revealed normal sinus rhythm with a  ventricular rate of 61 beats per minute with a chronic left bundle  branch block.  There was no evidence of acute ischemic changes.  T-wave  inversion is noted in leads V5-V6.   CONDITION UPON DISCHARGE:  Stable.   DISCHARGE MEDICATIONS:  1. Plavix 75 mg daily.  2. Aspirin 81 mg daily.  3. Metformin 1000 mg twice daily restarting on Oct 24, 2006.  4. Carvedilol 25 mg twice daily.  5. Enalapril 20 mg daily.  6. Lasix 20 mg one-half tablet daily.  7. Levothyroxine 50 mcg daily.  8. Simvastatin 40 mg daily.  9. ProAir 2 puffs in the p.m.  10.Glimepiride 4 mg twice daily.   DISCHARGE INSTRUCTIONS:  1. Hold Diovan 160 mg until further notice.  2. Bathe groin area  with warm soap and water.  Do not scrub area.      Avoid straining.  3. Stop any activity that causes chest pain, shortness of breath,      dizziness, sweating, or excessive weakness.  4. Continue a low-sodium, low-fat, low-cholesterol, low-carbohydrate      diet.  5. No lifting greater than 10 pounds for 1 week.  6. No driving for 2 days.   FOLLOWUP APPOINTMENT:  A followup appointment with a physician extender  at [facility] has been scheduled for May 20 at 11 a.m.  The  patient is scheduled to call [phone] if he is unable to make the  scheduled appointment.      [address] [place], [place]      Corky Crafts, MD  Electronically Signed    RDM/MEDQ  D:  10/22/2006  T:  10/22/2006  Job:  [id number]   cc:   Corky Crafts, MD  [name], M.D.

## 2010-11-03 NOTE — H&P (Signed)
[name]  [name], [name] NO.:  [id number]   MEDICAL RECORD NO.:  [id number]          PATIENT TYPE:  INP   LOCATION:  1845                         FACILITY:  [facility]   PHYSICIAN:  Elliot Cousin, M.D.    DATE OF BIRTH:  [birth year]   DATE OF ADMISSION:  02/11/2006  DATE OF DISCHARGE:                                HISTORY & PHYSICAL   CHIEF COMPLAINT:  Chest pain.   HISTORY OF PRESENT ILLNESS:  The patient is an 77 year old man with a past  medical history significant for coronary artery disease, diabetes mellitus  and laryngeal cancer, who presents to the emergency department with the  chief complaint of substernal chest pain.  The pain started approximately 6  days ago. At that time he presented to the emergency department.  His chest  pain resolved and he was sent home.  Over the past few days the chest pain  has been intermittent. He describes it as pressure.  He rates the chest pain  from a 5/10 to an 8/10 in intensity.  For the most part, the pain occurs  when he is resting.  It is worse when he lies flat and it seems to become  less intense when he sits up.  He has had no complaints of pleurisy; his  pain does not intensify with deep inspiration.  It generally lasts 15 to 30  minutes.  He took one sublingual nitroglycerin which may or may not have  given him relief with.  He did have associated diaphoresis and dizziness  once, but, for the most part he has had no further diaphoresis, nausea,  shortness of breath or radiation of the pain.  He denies any recent cough,  fever or chills.  No swelling in his legs.  When questioned about heartburn,  he says that he has heartburn occasionally although he is unable to  distinguish this pain from heartburn pain.   During the evaluation in the emergency department, the patient is noted to  be hemodynamically stable.  His electrocardiogram reveals a left bundle  branch block which is unchanged from the EKG on February 05, 2006.  His  initial cardiac markers are negative.  His chest x-ray reveals no acute  findings.  However, given the patient's risk factors and recurrence of  his  symptoms, he will be admitted for further evaluation and management.   PAST MEDICAL HISTORY:  1. History of coronary artery disease, myocardial infarction, status post      six vessel coronary artery bypass grafting in 1997.  2. Recent stress test in March 2007; the patient says that his test      results were within normal limits.  3. Type 2 diabetes mellitus, diagnosed approximately 20 years ago.  4. Hypertension.  5. Peripheral vascular disease with history of bilateral SFA occlusion;      bilateral carotid artery disease, status post CEA; left subclavian      artery stenosis.  6. Hyperlipidemia.  7. History of laryngeal cancer, status post tracheostomy in 1974.  8. Hypothyroidism.  9. Status post appendectomy in the past.  10.Status post bilateral inguinal hernia repairs in the past.  11.GERD   MEDICATIONS:  1. Aspirin 81 mg daily.  2. Norvasc 10 mg daily.  3. Doxazosin 8 mg daily.  4. Enalapril 10 mg daily.  5. Glipizide 10 mg b.i.d.  6. Levothyroxine 50 mcg daily.  7. Metformin 500 mg b.i.d.  8. Metoprolol 50 mg b.i.d..  9. Plavix 75 mg daily.  10.Zocor ? dose 5 mg daily.   ALLERGIES:  No known drug allergies.   SOCIAL HISTORY:  The patient is married.  [place]  He has two children. He is retired from [employer].  He works  part time at an [employer]. He stopped smoking many years ago.  No alcohol  use.  He still drives and is fairly independent.   FAMILY HISTORY:  His father died at 34 years of age, ? cause, possible old  age.  His mother died at 3 years of age secondary to complications from  diabetes mellitus.   REVIEW OF SYSTEMS:  The patient's review of systems is otherwise negative.   PHYSICAL EXAMINATION:  VITAL SIGNS:  Temperature 97.1, blood pressure  137/56,  pulse 64, respiratory rate 20, oxygen saturation 98% on room air.  GENERAL APPEARANCE:  The patient is a pleasant, 77 year old elderly  Caucasian man who is currently lying in bed in no acute distress.  HEENT:  Head is normocephalic, atraumatic.  Pupils equal, round, reactive to  light. Extraocular movements intact. Conjunctivae are clear.  Sclerae are  white.  Tympanic membranes are clear bilaterally.  Nasal mucosa is mildly  dry. No sinus tenderness.  Oropharynx reveals fairly good dentition.  Mucous  membranes are mildly dry.  No posterior exudates or erythema.  NECK:  Supple.  There is a well-circumscribed ostomy without surrounding  drainage or erythema.  He does have bilateral bruits.  No jugular venous  distention.  No thyromegaly.  LUNGS:  Clear to auscultation bilaterally. Well-healed sternotomy scar.  HEART:  S1, S2, with a soft systolic murmur.  ABDOMEN:  Positive bowel sounds, soft, nontender, nondistended.  No  hepatosplenomegaly.  No masses palpated.  Rectal and genitourinary are  deferred.  EXTREMITIES:  The patient has a well-healed harvesting scar on his right  leg.  Pedal pulses barely palpable. No pretibial edema and no pedal edema.  NEUROLOGICAL:  The patient is alert and oriented x3.  Cranial nerves II-XII  are intact.  Strength is 5/5 throughout.  Sensation is intact.   ADMISSION LABORATORY DATA:  EKG reveals normal sinus rhythm with heart rate  of 65 beats per minute and left bundle branch block.  White blood cell count  7.1, hemoglobin 12.7, hematocrit 37.3, platelet count 266,000.  Myoglobin  76.6.  CK-MB 1.6, troponin-I less than 0.05.  Sodium 138, potassium 4.3,  chloride 102, CO2 23, BUN 21, creatinine 0.9, albumin 3.3, total protein  5.9, AST 22, ALT 14, alkaline phosphatase 56, total bilirubin 0.7, direct  bilirubin less than 0.1.   ASSESSMENT:  1. Substernal chest pain.  The patient's electrocardiogram reveals a left     bundle branch block which is  possibly old.  His initial cardiac markers      are negative.  He is virtually chest pain-free now.  However, this is      his second presentation to the emergency department in one week for      chest pain.  Certainly, unstable angina comes to mind.  His symptoms  appear to be somewhat atypical.  However, given his risk factors, a      myocardial infarction/myocardial ischemia will need to be ruled out.      The differential diagnoses also includes pulmonary embolus, chest wall      pain and gastroesophageal reflux disease.   1. Type 2 diabetes mellitus.  The patient's glycemic control is unknown,      although the patient says that his blood sugars are well controlled on      Glucotrol and metformin.  2. Hypertension.  The patient's blood pressure is within normal limits      currently.  3. Hypothyroidism.  4. Hyperlipidemia.  5. Status post tracheostomy secondary to laryngeal cancer in 1974.   PLAN:  1. The patient will be admitted for further evaluation and management.  2. Will start a heparin drip and a nitroglycerin drip.  Will also manage      the patient's pain with as-needed morphine.  3. Will continue the patient's cardiac medications.  4. Oxygen therapy.  5. Will consult cardiologist, Dr. Amil Amen.  6. Will check cardiac enzymes q.8h. x3, TSH and D-dimer.  If the patient's      D-dimer is elevated, will consider obtaining a VQ scan and/or CT scan      of the chest to rule out PE.  7. Will check carotid Doppler's.  8. Will continue Glucotrol but hold metformin just in case the patient      undergoes a cardiac catheterization.  Will add a sliding scale insulin      regimen with NovoLog q.a.c. and q.h.s.  9. Will check a fasting lipid panel in the morning.      Elliot Cousin, M.D.  Electronically Signed     DF/MEDQ  D:  02/11/2006  T:  02/11/2006  Job:  [id number]   cc:   Reuben Likes, M.D.  Francisca December, M.D.

## 2010-11-03 NOTE — Consult Note (Signed)
[name], [name] NO.:  [id number]   MEDICAL RECORD NO.:  [id number]          PATIENT TYPE:  INP   LOCATION:  4737                         FACILITY:  [facility]   PHYSICIAN:  [name], M.D.  DATE OF BIRTH:  Jun 24, 1922   DATE OF CONSULTATION:  02/11/2006  DATE OF DISCHARGE:                                   CONSULTATION   REQUESTING PHYSICIAN:  Dr. Sherrie Mustache.   REASON FOR CONSULTATION:  Chest pain.   HISTORY OF PRESENT ILLNESS:  [name] is a pleasant 77 year old man  well-known to me s/p CABG in 1997 who was last seen in my office March of  2007.  Asymptomatic at the time.  Underwent screening adenosine Cardiolite  which was normal.  EKG chronically shows left bundle branch block.  He began  having substernal chest pain approximately February 05, 2006 and presented to  the emergency room and was monitored for approximately 4 hours, had the  usual point of care enzymes and sent home.  He did well for 3 or 4 days.  Yesterday he had a recurrence of the chest discomfort.  It is very difficult  for him to describe this although it is mid sternal and pressure-like.  It  waxes and wanes.  May last for 10 or 15 minutes and then resolve.  Today he  did try nitroglycerin without much effect both at home and by EMS.  He did  have relief with morphine in the emergency room.  He did have some sweating  and dizziness today with the chest discomfort, no presyncope or syncope.   At the time of my evaluation at 1700, he has mild discomfort that has  recurred since receiving the morphine.  There is no radiation and no  shortness of breath.   He notes that of he is eating and lying flat he has more discomfort in his  chest than if he is sitting up.   PAST MEDICAL HISTORY:  1. GERD. Has been on AcipHex in the past.  2. Bilateral SFA occlusion.  3. Bilateral carotid artery disease s/p CEA.  4. Left subclavian artery stenosis.  5. Hyperlipidemia.  6. Hypertension.  7. Diabetes mellitus on oral agent.  8. History of throat carcinoma, s/p laryngectomy.  9. Long-term medication use.   SOCIAL HISTORY:  No ethanol or tobacco use. He is accompanied by his wife  here in the  emergency room.  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 He lives in [place].   FAMILY HISTORY:  Mother died of diabetes and coronary disease.  Father died  of unknown causes.   DRUG ALLERGIES:  None known.   CURRENT MEDICATIONS:  1. Aspirin 1 mg p.o. daily.  2. Doxazosin 8 mg p.o. daily.  3. Glipizide 10 mg p.o. b.i.d.  4. Metformin 500 mg 2 b.i.d.  5. Plavix 75 mg p.o. daily.  6. AcipHex 20 mg p.o. daily p.r.n.  7. Norvasc 10 mg p.o. daily.  8. Enalapril 10 mg p.o. daily.  9. Levothyroxine 50 mcg p.o. daily.  10.Metoprolol 100 mg p.o. daily.  11.Simvastatin 5 mg p.o. daily.   PHYSICAL EXAMINATION:  VITAL SIGNS:  Blood pressure is 137/59, pulse 65 and  regular, respiratory rate 20, temperature 97.0.  GENERAL:  This is a well-appearing 77 year old Caucasian man who appears  younger than his stated age. He is able to speak by occluding his  laryngectomy ostium.  HEENT:  Unremarkable. There are bilateral bruits in the laryngectomy ostomy  present.  CHEST:  Clear bilaterally.  HEART:  The heart has a regular rhythm.  Normal S1 and S2, well-healed  midline sternotomy scar.  ABDOMEN:  Soft, nontender.  No midline pulsatile mass.  Bowel sounds present  in all quadrants.  EXTERNAL GENITALIA:  Normal male phallus, descended testicles.  No lesion.  RECTAL:  Not performed.  EXTREMITIES:  Show full range of motion, no edema. Note palpable pedal  pulses. Both feet are warm and pink.  NEUROLOGICALLY:  Cranial nerves II-XII are intact.  Motor and sensory  grossly intact.  Gait not tested.  SKIN:  Warm, dry and clear.   ACCESSORY CLINICAL DATA:  Point of care enzymes negative x3.  Electrocardiogram, sinus rhythm, left bundle branch block no change. Chest x-  ray  no acute  cardiopulmonary disease.   IMPRESSION:  1. Rest chest discomfort.  He has been able to walk on a treadmill once      since this began last week without symptoms.  May represent unstable      angina but also possibly esophagitis and esophageal spasm.  2. History of coronary disease now 11 years s/p coronary artery bypass      graft, well into the graft failure phase.  3. Chronic left bundle branch block.  4. Extensive arteriosclerotic peripheral vascular disease.  5. Hyperlipidemia treated.  6. Hypertension treated.  7. Diabetes mellitus treated.  8. Throat carcinoma s/p laryngectomy.   PLAN:  1. Agree with your management thus far which includes systemic      anticoagulation, would prefer Lovenox subcu 1 mg/kg.  Will hold IV      nitroglycerin at this time.  2. Pepcid 40 mg p.o. at this time.  3. Protonix 40 mg daily.  4. GI cocktail at this time.  5. CK-MB and troponin enzymes x2 overnight.  6. Will reassess in the a.m. and consider noninvasive versus invasive      further evaluation.   Thank you very much for allowing me to assist in the care of [name]. It has been a pleasure to do so.  I will discuss his further care  with you.      [name], M.D.  Electronically Signed     JHE/MEDQ  D:  02/11/2006  T:  02/12/2006  Job:  [id number]   cc:   [name], M.D.

## 2011-04-26 ENCOUNTER — Emergency Department (HOSPITAL_COMMUNITY): Payer: Medicare Other

## 2011-04-26 ENCOUNTER — Other Ambulatory Visit: Payer: Self-pay

## 2011-04-26 ENCOUNTER — Emergency Department (HOSPITAL_COMMUNITY)
Admission: EM | Admit: 2011-04-26 | Discharge: 2011-04-26 | Disposition: A | Discharge status: Home / Self Care | Payer: Medicare Other | Attending: Emergency Medicine | Admitting: Emergency Medicine

## 2011-04-26 ENCOUNTER — Encounter: Payer: Self-pay | Admitting: Emergency Medicine

## 2011-04-26 DIAGNOSIS — C140 Malignant neoplasm of pharynx, unspecified: Secondary | ICD-10-CM | POA: Insufficient documentation

## 2011-04-26 DIAGNOSIS — Z930 Tracheostomy status: Secondary | ICD-10-CM | POA: Insufficient documentation

## 2011-04-26 DIAGNOSIS — I2510 Atherosclerotic heart disease of native coronary artery without angina pectoris: Secondary | ICD-10-CM | POA: Insufficient documentation

## 2011-04-26 DIAGNOSIS — E119 Type 2 diabetes mellitus without complications: Secondary | ICD-10-CM | POA: Insufficient documentation

## 2011-04-26 DIAGNOSIS — I10 Essential (primary) hypertension: Secondary | ICD-10-CM | POA: Insufficient documentation

## 2011-04-26 DIAGNOSIS — R079 Chest pain, unspecified: Secondary | ICD-10-CM | POA: Insufficient documentation

## 2011-04-26 HISTORY — DX: Atherosclerotic heart disease of native coronary artery without angina pectoris: I25.10

## 2011-04-26 HISTORY — DX: Essential (primary) hypertension: I10

## 2011-04-26 HISTORY — DX: Malignant neoplasm of pharynx, unspecified: C14.0

## 2011-04-26 LAB — PROTIME-INR: INR: 0.91 (ref 0.00–1.49)

## 2011-04-26 LAB — CBC
HCT: 31.2 % — ABNORMAL LOW (ref 39.0–52.0)
Hemoglobin: 10.7 g/dL — ABNORMAL LOW (ref 13.0–17.0)
MCH: 30.5 pg (ref 26.0–34.0)
MCHC: 34.3 g/dL (ref 30.0–36.0)
RBC: 3.51 MIL/uL — ABNORMAL LOW (ref 4.22–5.81)

## 2011-04-26 LAB — BASIC METABOLIC PANEL
BUN: 15 mg/dL (ref 6–23)
Chloride: 95 mEq/L — ABNORMAL LOW (ref 96–112)
GFR calc Af Amer: >90 mL/min (ref >90)
Glucose, Bld: 190 mg/dL — ABNORMAL HIGH (ref 70–99)
Potassium: 4.3 mEq/L (ref 3.5–5.1)
Sodium: 129 mEq/L — ABNORMAL LOW (ref 135–145)

## 2011-04-26 LAB — POCT I-STAT TROPONIN I: Troponin i, poc: 0 ng/mL (ref 0.00–0.08)

## 2011-04-26 MED ORDER — ASPIRIN 81 MG PO TABS
324.0000 mg | ORAL_TABLET | Freq: Once | ORAL | Status: AC
  Administered 2011-04-26: 325 mg via ORAL

## 2011-04-26 MED ORDER — ASPIRIN 325 MG PO TABS
ORAL_TABLET | ORAL | Status: AC
  Filled 2011-04-26: qty 1

## 2011-04-26 NOTE — ED Notes (Signed)
Pt anxious to get information re: plan of care. Family at Bedside. VSS. NSR without ectopy.

## 2011-04-26 NOTE — ED Provider Notes (Cosign Needed Addendum)
History     CSN: [id number] Arrival date & time: 04/26/2011  4:38 PM   First MD Initiated Contact with Patient 04/26/11 1712      Chief Complaint  Patient presents with  . Chest Pain    (Consider location/radiation/quality/duration/timing/severity/associated sxs/prior treatment) HPI  Past Medical History  Diagnosis Date  . Coronary artery disease   . Diabetes mellitus   . Hypertension   . Throat cancer     Past Surgical History  Procedure Date  . Tracheostomy     History reviewed. No pertinent family history.  History  Substance Use Topics  . Smoking status: Unknown If Ever Smoked  . Smokeless tobacco: Not on file  . Alcohol Use: No      Review of Systems  Allergies  Review of patient's allergies indicates no known allergies.  Home Medications   Current Outpatient Rx  Name Route Sig Dispense Refill  . ACARBOSE 100 MG PO TABS Oral Take 100 mg by mouth 3 (three) times daily with meals.      . AMLODIPINE BESYLATE 5 MG PO TABS Oral Take 5 mg by mouth daily.      Marland Kitchen CLOPIDOGREL BISULFATE 75 MG PO TABS Oral Take 75 mg by mouth daily.      Marland Kitchen FINASTERIDE 5 MG PO TABS Oral Take 5 mg by mouth daily.      Marland Kitchen FLUTICASONE PROPIONATE 50 MCG/ACT NA SUSP Nasal Place 2 sprays into the nose daily.      Marland Kitchen GLIPIZIDE 5 MG PO TABS Oral Take 10 mg by mouth 2 (two) times daily before a meal.      . LEVOTHYROXINE SODIUM 50 MCG PO TABS Oral Take 50 mcg by mouth every morning.      Marland Kitchen LISINOPRIL-HYDROCHLOROTHIAZIDE 20-12.5 MG PO TABS Oral Take 2 tablets by mouth daily.      Marland Kitchen METFORMIN HCL 500 MG PO TABS Oral Take 1,000 mg by mouth 2 (two) times daily with a meal.        BP 127/81  Pulse 81  Temp(Src) 98.4 F (36.9 C) (Oral)  Resp 14  SpO2 100%  Physical Exam  ED Course  Procedures (including critical care time)  Labs Reviewed  BASIC METABOLIC PANEL - Abnormal; Notable for the following:    Sodium 129 (*)    Chloride 95 (*)    Glucose, Bld 190 (*)    GFR calc non Af  Amer 81 (*)    All other components within normal limits  CBC - Abnormal; Notable for the following:    RBC 3.51 (*)    Hemoglobin 10.7 (*)    HCT 31.2 (*)    All other components within normal limits  PROTIME-INR  POCT I-STAT TROPONIN I  I-STAT TROPONIN I  I-STAT TROPONIN I   Dg Chest 2 View  04/26/2011  *RADIOLOGY REPORT*  Clinical Data: Chest pain, cough  CHEST - 2 VIEW  Comparison: 03/15/2011  Findings: Previous CABG.  Atheromatous aorta.  Heart size normal. Lungs clear.  No effusion.  IMPRESSION:  1.  No acute disease post CABG.  Original Report Authenticated By: Osa Craver, M.D.     No diagnosis found.    MDM  I saw and evaluated the patient, reviewed the resident's note and I agree with the findings and plan.   Results for orders placed during the hospital encounter of 04/26/11  BASIC METABOLIC PANEL      Component Value Range   Sodium 129 (*) 135 - 145 (  mEq/L)   Potassium 4.3  3.5 - 5.1 (mEq/L)   Chloride 95 (*) 96 - 112 (mEq/L)   CO2 28  19 - 32 (mEq/L)   Glucose, Bld 190 (*) 70 - 99 (mg/dL)   BUN 15  6 - 23 (mg/dL)   Creatinine, Ser 1.61  0.50 - 1.35 (mg/dL)   Calcium 9.0  8.4 - 09.6 (mg/dL)   GFR calc non Af Amer 81 (*) >90 (mL/min)   GFR calc Af Amer >90  >90 (mL/min)  CBC      Component Value Range   WBC 6.2  4.0 - 10.5 (K/uL)   RBC 3.51 (*) 4.22 - 5.81 (MIL/uL)   Hemoglobin 10.7 (*) 13.0 - 17.0 (g/dL)   HCT 04.5 (*) 40.9 - 52.0 (%)   MCV 88.9  78.0 - 100.0 (fL)   MCH 30.5  26.0 - 34.0 (pg)   MCHC 34.3  30.0 - 36.0 (g/dL)   RDW 81.1  91.4 - 78.2 (%)   Platelets 273  150 - 400 (K/uL)  PROTIME-INR      Component Value Range   Prothrombin Time 12.5  11.6 - 15.2 (seconds)   INR 0.91  0.00 - 1.49   POCT I-STAT TROPONIN I      Component Value Range   Troponin i, poc 0.00  0.00 - 0.08 (ng/mL)   Comment 3            Dg Chest 2 View  04/26/2011  *RADIOLOGY REPORT*  Clinical Data: Chest pain, cough  CHEST - 2 VIEW  Comparison: 03/15/2011   Findings: Previous CABG.  Atheromatous aorta.  Heart size normal. Lungs clear.  No effusion.  IMPRESSION:  1.  No acute disease post CABG.  Original Report Authenticated By: Osa Craver, M.D.    I saw and evaluated the patient, reviewed the resident's note and I agree with the findings and plan.       Patient remains chest pain-free, he has been stable and asymptomatic. The entire visit. We have discussed the plan with his cardiologist. They're recommending outpatient followup for a morning. The patient is also amenable to this. Will continue to observe and check a three-hour marker. If the patient remains asymptomatic at this time and the blood tests are normal. He will likely go home for further outpatient followup    Hakop Humbarger A. Patrica Duel, MD 04/26/11 1859  Lorelle Gibbs. Patrica Duel, MD 04/26/11 1952  Lorelle Gibbs. Patrica Duel, MD 04/26/11 1958

## 2011-04-26 NOTE — ED Provider Notes (Signed)
History     CSN: [id number] Arrival date & time: 04/26/2011  4:38 PM   First MD Initiated Contact with Patient 04/26/11 1712      Chief Complaint  Patient presents with  . Chest Pain    (Consider location/radiation/quality/duration/timing/severity/associated sxs/prior treatment) Patient is a 77 y.o. male presenting with chest pain. The history is provided by the patient.  Chest Pain The chest pain began 1 - 2 hours ago. Chest pain occurs rarely. The chest pain is resolved. The severity of the pain is mild. The quality of the pain is described as aching. The pain does not radiate. Pertinent negatives for primary symptoms include no fever, no syncope, no shortness of breath, no cough, no palpitations, no abdominal pain, no nausea, no vomiting, no dizziness and no altered mental status. He tried nitroglycerin and aspirin for the symptoms. Risk factors include being elderly and male gender.  His past medical history is significant for CAD, hyperlipidemia, hypertension and MI.  Pertinent negatives for past medical history include no CHF.     Past Medical History  Diagnosis Date  . Coronary artery disease   . Diabetes mellitus   . Hypertension   . Throat cancer     Past Surgical History  Procedure Date  . Tracheostomy     History reviewed. No pertinent family history.  History  Substance Use Topics  . Smoking status: Unknown If Ever Smoked  . Smokeless tobacco: Not on file  . Alcohol Use: No      Review of Systems  Constitutional: Negative for fever.  Respiratory: Negative for cough and shortness of breath.   Cardiovascular: Positive for chest pain. Negative for palpitations and syncope.  Gastrointestinal: Negative for nausea, vomiting, abdominal pain and diarrhea.  Neurological: Negative for dizziness and headaches.  Psychiatric/Behavioral: Negative for altered mental status.  All other systems reviewed and are negative.    Allergies  Review of patient's allergies  indicates no known allergies.  Home Medications   Current Outpatient Rx  Name Route Sig Dispense Refill  . ACARBOSE 100 MG PO TABS Oral Take 100 mg by mouth 3 (three) times daily with meals.      . AMLODIPINE BESYLATE 5 MG PO TABS Oral Take 5 mg by mouth daily.      Marland Kitchen CLOPIDOGREL BISULFATE 75 MG PO TABS Oral Take 75 mg by mouth daily.      Marland Kitchen FINASTERIDE 5 MG PO TABS Oral Take 5 mg by mouth daily.      Marland Kitchen FLUTICASONE PROPIONATE 50 MCG/ACT NA SUSP Nasal Place 2 sprays into the nose daily.      Marland Kitchen GLIPIZIDE 5 MG PO TABS Oral Take 10 mg by mouth 2 (two) times daily before a meal.      . LEVOTHYROXINE SODIUM 50 MCG PO TABS Oral Take 50 mcg by mouth every morning.      Marland Kitchen LISINOPRIL-HYDROCHLOROTHIAZIDE 20-12.5 MG PO TABS Oral Take 2 tablets by mouth daily.      Marland Kitchen METFORMIN HCL 500 MG PO TABS Oral Take 1,000 mg by mouth 2 (two) times daily with a meal.        BP 148/66  Pulse 73  Temp(Src) 98.4 F (36.9 C) (Oral)  Resp 14  SpO2 100%  Physical Exam  Nursing note and vitals reviewed. Constitutional: He is oriented to person, place, and time. He appears well-developed and well-nourished. No distress.  HENT:  Head: Normocephalic and atraumatic.       Tracheostomy site clean, no obvious secretions  Eyes: Pupils are equal, round, and reactive to light.  Cardiovascular: Normal rate and normal heart sounds.   Pulmonary/Chest: Effort normal and breath sounds normal. No respiratory distress.  Abdominal: Soft. He exhibits no distension. There is no tenderness.  Musculoskeletal: Normal range of motion.  Neurological: He is alert and oriented to person, place, and time.  Skin: Skin is warm and dry.  Psychiatric: He has a normal mood and affect.    ED Course  Procedures (including critical care time)  Dg Chest 2 View  04/26/2011  *RADIOLOGY REPORT*  Clinical Data: Chest pain, cough  CHEST - 2 VIEW  Comparison: 03/15/2011  Findings: Previous CABG.  Atheromatous aorta.  Heart size normal. Lungs  clear.  No effusion.  IMPRESSION:  1.  No acute disease post CABG.  Original Report Authenticated By: Osa Craver, M.D.   Results for orders placed during the hospital encounter of 04/26/11  BASIC METABOLIC PANEL      Component Value Range   Sodium 129 (*) 135 - 145 (mEq/L)   Potassium 4.3  3.5 - 5.1 (mEq/L)   Chloride 95 (*) 96 - 112 (mEq/L)   CO2 28  19 - 32 (mEq/L)   Glucose, Bld 190 (*) 70 - 99 (mg/dL)   BUN 15  6 - 23 (mg/dL)   Creatinine, Ser 1.61  0.50 - 1.35 (mg/dL)   Calcium 9.0  8.4 - 09.6 (mg/dL)   GFR calc non Af Amer 81 (*) >90 (mL/min)   GFR calc Af Amer >90  >90 (mL/min)  CBC      Component Value Range   WBC 6.2  4.0 - 10.5 (K/uL)   RBC 3.51 (*) 4.22 - 5.81 (MIL/uL)   Hemoglobin 10.7 (*) 13.0 - 17.0 (g/dL)   HCT 04.5 (*) 40.9 - 52.0 (%)   MCV 88.9  78.0 - 100.0 (fL)   MCH 30.5  26.0 - 34.0 (pg)   MCHC 34.3  30.0 - 36.0 (g/dL)   RDW 81.1  91.4 - 78.2 (%)   Platelets 273  150 - 400 (K/uL)  PROTIME-INR      Component Value Range   Prothrombin Time 12.5  11.6 - 15.2 (seconds)   INR 0.91  0.00 - 1.49   POCT I-STAT TROPONIN I      Component Value Range   Troponin i, poc 0.00  0.00 - 0.08 (ng/mL)   Comment 3           POCT I-STAT TROPONIN I      Component Value Range   Troponin i, poc 0.00  0.00 - 0.08 (ng/mL)   Comment 3              Date: 04/26/2011  Rate: 82  Rhythm: normal sinus rhythm  QRS Axis: normal  Intervals: normal  ST/T Wave abnormalities: indeterminate  Conduction Disutrbances:left bundle branch block  Narrative Interpretation:   Old EKG Reviewed: unchanged   1. Chest pain       MDM  5:58 PM Pt seen and examined. Pt with two episodes of aching chest pain with activity the last two days. Pain resolves as soon as he sits down. He did take a nitro earlier today because of these pains. Pt follows with [facility] cardiology. Concern for ACS-will get labs. Pt with old LBBB on ekg. Possible lung infection as well as tracheostomy patient with  increased cough over last several days.   7:05 PM Patient with negative troponin and no other episodes of chest pain. Will  consult cardiology for plan as to admission v home and close follow up.  7:51 PM Spoke to Dr. Verdis Prime [facility]) about patient. Will check second troponin and if pain remains at bay and enzyme is negative, will send home to follow up first thing in AM with Dr. Ailene [facility]).   Second troponin negative as well. Pt without further episodes of chest pain here. Will d/c home with close follow up tomorrow. Pt to return to ED for any return of symptoms.  Daleen Bo 04/26/11 2312

## 2011-04-26 NOTE — ED Notes (Signed)
Pt awaiting lab results. Pt provided with something to eat. Family at bedside. Pt denies pain at this time. vss.

## 2011-04-26 NOTE — ED Notes (Signed)
Pt c/o episode of left sided CP while working under house that went away with rest; pt sts episode similar yesterday; pt denies SOB, N/V or diaphoresis; pt sts pain free at present

## 2011-04-26 NOTE — ED Notes (Signed)
MD notified that pt and family are anxious for plan of care. MD in to see pt.

## 2011-04-29 NOTE — ED Provider Notes (Signed)
I saw and evaluated the patient, reviewed the resident's note and I agree with the findings and plan.  Daytona Hedman A. Lavance Beazer, MD 04/29/11 0746 

## 2011-05-28 ENCOUNTER — Other Ambulatory Visit: Payer: Self-pay

## 2011-05-28 ENCOUNTER — Encounter (HOSPITAL_COMMUNITY): Payer: Self-pay

## 2011-05-28 ENCOUNTER — Emergency Department (HOSPITAL_COMMUNITY)
Admission: EM | Admit: 2011-05-28 | Discharge: 2011-05-28 | Disposition: A | Discharge status: Home / Self Care | Payer: Medicare Other | Attending: Emergency Medicine | Admitting: Emergency Medicine

## 2011-05-28 ENCOUNTER — Emergency Department (HOSPITAL_COMMUNITY): Payer: Medicare Other

## 2011-05-28 DIAGNOSIS — I2510 Atherosclerotic heart disease of native coronary artery without angina pectoris: Secondary | ICD-10-CM | POA: Insufficient documentation

## 2011-05-28 DIAGNOSIS — I10 Essential (primary) hypertension: Secondary | ICD-10-CM | POA: Insufficient documentation

## 2011-05-28 DIAGNOSIS — R0602 Shortness of breath: Secondary | ICD-10-CM | POA: Insufficient documentation

## 2011-05-28 DIAGNOSIS — E119 Type 2 diabetes mellitus without complications: Secondary | ICD-10-CM | POA: Insufficient documentation

## 2011-05-28 DIAGNOSIS — Z79899 Other long term (current) drug therapy: Secondary | ICD-10-CM | POA: Insufficient documentation

## 2011-05-28 DIAGNOSIS — Z85819 Personal history of malignant neoplasm of unspecified site of lip, oral cavity, and pharynx: Secondary | ICD-10-CM | POA: Insufficient documentation

## 2011-05-28 HISTORY — DX: Occlusion and stenosis of unspecified carotid artery: I65.29

## 2011-05-28 HISTORY — DX: Atherosclerosis of renal artery: I70.1

## 2011-05-28 HISTORY — DX: Anemia, unspecified: D64.9

## 2011-05-28 HISTORY — DX: Hypothyroidism, unspecified: E03.9

## 2011-05-28 HISTORY — DX: Polyneuropathy, unspecified: G62.9

## 2011-05-28 HISTORY — DX: Diverticulosis of intestine, part unspecified, without perforation or abscess without bleeding: K57.90

## 2011-05-28 HISTORY — DX: Diaphragmatic hernia without obstruction or gangrene: K44.9

## 2011-05-28 HISTORY — DX: Hyperlipidemia, unspecified: E78.5

## 2011-05-28 HISTORY — DX: Polyp of colon: K63.5

## 2011-05-28 LAB — DIFFERENTIAL
Basophils Absolute: 0 10*3/uL (ref 0.0–0.1)
Basophils Relative: 0 % (ref 0–1)
Eosinophils Absolute: 0.2 10*3/uL (ref 0.0–0.7)
Eosinophils Relative: 3 % (ref 0–5)
Lymphocytes Relative: 17 % (ref 12–46)
Lymphs Abs: 1.2 10*3/uL (ref 0.7–4.0)
Monocytes Absolute: 0.8 10*3/uL (ref 0.1–1.0)
Monocytes Relative: 11 % (ref 3–12)
Neutro Abs: 4.8 10*3/uL (ref 1.7–7.7)
Neutrophils Relative %: 69 % (ref 43–77)

## 2011-05-28 LAB — CBC
HCT: 34.5 % — ABNORMAL LOW (ref 39.0–52.0)
Hemoglobin: 11.6 g/dL — ABNORMAL LOW (ref 13.0–17.0)
MCH: 30 pg (ref 26.0–34.0)
MCHC: 33.6 g/dL (ref 30.0–36.0)
MCV: 89.1 fL (ref 78.0–100.0)
Platelets: 272 10*3/uL (ref 150–400)
RBC: 3.87 MIL/uL — ABNORMAL LOW (ref 4.22–5.81)
RDW: 14.1 % (ref 11.5–15.5)
WBC: 6.9 10*3/uL (ref 4.0–10.5)

## 2011-05-28 LAB — POCT I-STAT, CHEM 8
BUN: 17 mg/dL (ref 6–23)
Calcium, Ion: 1.15 mmol/L (ref 1.12–1.32)
Chloride: 98 mEq/L (ref 96–112)
Creatinine, Ser: 0.8 mg/dL (ref 0.50–1.35)
Glucose, Bld: 175 mg/dL — ABNORMAL HIGH (ref 70–99)
HCT: 36 % — ABNORMAL LOW (ref 39.0–52.0)
Hemoglobin: 12.2 g/dL — ABNORMAL LOW (ref 13.0–17.0)
Potassium: 4 mEq/L (ref 3.5–5.1)
Sodium: 135 mEq/L (ref 135–145)
TCO2: 27 mmol/L (ref 0–100)

## 2011-05-28 LAB — CARDIAC PANEL(CRET KIN+CKTOT+MB+TROPI)
Relative Index: INVALID (ref 0.0–2.5)
Total CK: 82 U/L (ref 7–232)

## 2011-05-28 LAB — POCT I-STAT TROPONIN I: Troponin i, poc: 0.01 ng/mL (ref 0.00–0.08)

## 2011-05-28 MED ORDER — IOHEXOL 300 MG/ML  SOLN
80.0000 mL | Freq: Once | INTRAMUSCULAR | Status: AC | PRN
  Administered 2011-05-28: 80 mL via INTRAVENOUS

## 2011-05-28 MED ORDER — ASPIRIN 81 MG PO CHEW
324.0000 mg | CHEWABLE_TABLET | Freq: Once | ORAL | Status: AC
  Administered 2011-05-28: 243 mg via ORAL
  Filled 2011-05-28: qty 4

## 2011-05-28 NOTE — ED Notes (Signed)
Registration at bedside.

## 2011-05-28 NOTE — ED Notes (Signed)
Gave patient's family refreshments and coffee.

## 2011-05-28 NOTE — ED Provider Notes (Signed)
  Physical Exam  BP 156/52  Pulse 77  Temp(Src) 97.9 F (36.6 C) (Oral)  Resp 18  SpO2 97%  Physical Exam  ED Course  Procedures  MDM I discussed the patient with Dr. Mayford Knife. She discussed the patient with Dr. Jiles Garter, the patient's cardiologist. They do not believe this is cardiac in cause. He recommended a metastatic cardiac enzymes and wait on results of CT angiography. He'll likely be discharged.    The second set of cardiac enzymes is also negative. His CT image of the chest did not show a PE. He'll be discharged home to followup as needed.  Juliet Rude. Rubin Payor, MD 05/28/11 [phone]

## 2011-05-28 NOTE — ED Notes (Signed)
Pt states that he awoke at 3am and "just didn't feel good"

## 2011-05-28 NOTE — ED Notes (Signed)
Patient currently resting quietly in bed; no respiratory or acute distress noted.  Family present at bedside.  Will continue to monitor. 

## 2011-05-28 NOTE — Consults (Signed)
Admit date: 05/28/2011 Referring Physician  Dr. Daun Peacock Primary Physician  Dr. Merlene Laughter Primary Cardiologist  Dr. Everette Rank Reason for Consultation  mild shortness of breath and hypertension  HPI: This is an 77 year old male followed by Dr. Eldridge Dace for coronary disease. He has a history of coronary disease status post coronary artery bypass grafting in 1996 and then non-ST elevation MI in August of 2007 at which time he was found to have an occluded saphenous vein graft to the obtuse marginal and underwent PCI. He presented to the emergency room back in December after an episode of aching in his chest after working outside in the yard. He was subsequently discharged home from the ER and followed up with Dr. Everette Rank on November 9. He was doing well with no further chest pain and it was felt his pain was atypical. He was continued on medical management. Last night apparently he he said he felt a little funny and took his blood pressure and systolic blood pressure was 170 mmHg. He denied any chest pain or pressure but said he was a little short of breath but very minor. His wife prompted him to go to the emergency room to be evaluated. In the emergency room he was given medically stable with normal blood work. His initial troponin was normal. We are now asked to consult for further evaluation. In questioning the patient further he denies any chest pain or chest pressure and states that the shortness of breath he had was very minimal. He's not had any chest pain episodes or shortness of breath since seeing Dr. Everette Rank except for the minor episode of shortness of breath last evening.     PMH:   Past Medical History  Diagnosis Date  . Diabetes mellitus   . Hypertension   . Throat cancer   . Coronary artery disease     s/p CABG 1996 then PCI of SVG to OM 2007  . Diverticulosis   . Dyslipidemia   . Renal artery stenosis     s/p bilateral stents  . Hiatal hernia   . Colon polyps   .  Carotid artery stenosis   . Peripheral neuropathy   . Anemia   . Hypothyroidism   . Nasal polyps      PSH:   Past Surgical History  Procedure Date  . Tracheostomy   . Coronary artery bypass graft     1996  . Cardiac catheterization     PCI of SVG to OM 2007  . Renal artery stent     Allergies:  Review of patient's allergies indicates no known allergies. Prior to Admit Meds:   (Not in a hospital admission) Fam HX:   History reviewed. No pertinent family history. Social HX:    History   Social History  . Marital Status: Married    Spouse Name: [name]    Number of Children: [name]  . Years of Education: [name]   Occupational History  . Not on file.   Social History Main Topics  . Smoking status: Former Games developer  . Smokeless tobacco: Not on file  . Alcohol Use: No  . Drug Use: No  . Sexually Active:    Other Topics Concern  . Not on file   Social History Narrative  . No narrative on file     ROS:  All 11 ROS were addressed and are negative except what is stated in the HPI  Physical Exam: Blood pressure 154/78, pulse 72, temperature 97.9 F (36.6  C), temperature source Oral, resp. rate 18, SpO2 100.00%.    General: Well developed, well nourished, in no acute distress Head: Eyes PERRLA, No xanthomas.   Normal cephalic and atramatic  Lungs:   Clear bilaterally to auscultation and percussion. Heart:   HRRR S1 S2 Pulses are 2+ & equal.            No carotid bruit. No JVD.  No abdominal bruits. No femoral bruits. Abdomen: Bowel sounds are positive, abdomen soft and non-tender without masses Extremities:   No clubbing, cyanosis or edema.  DP +1 Neuro: Alert and oriented X 3. Psych:  Good affect, responds appropriately    Labs:   Lab Results  Component Value Date   WBC 6.9 05/28/2011   HGB 12.2* 05/28/2011   HCT 36.0* 05/28/2011   MCV 89.1 05/28/2011   PLT 272 05/28/2011    Lab 05/28/11 0408  NA 135  K 4.0  CL 98  CO2 --  BUN 17  CREATININE 0.80  CALCIUM --    PROT --  BILITOT --  ALKPHOS --  ALT --  AST --  GLUCOSE 175*   No results found for this basename: PTT   Lab Results  Component Value Date   INR 0.91 04/26/2011   Lab Results  Component Value Date   CKTOTAL 82 05/28/2011   CKMB 3.2 05/28/2011   TROPONINI <0.30 05/28/2011         Radiology:  Ct Angio Chest W/cm &/or Wo Cm  05/28/2011  *RADIOLOGY REPORT*  Clinical Data:  Shortness of breath.  Hypertension.  CT ANGIOGRAPHY CHEST WITH CONTRAST  Technique:  Multidetector CT imaging of the chest was performed using the standard protocol during bolus administration of intravenous contrast.  Multiplanar CT image reconstructions including MIPs were obtained to evaluate the vascular anatomy.  Contrast: 80mL OMNIPAQUE IOHEXOL 300 MG/ML IV SOLN 80 ml Omnipaque- 300  Comparison:  Chest x-ray dated 05/28/2011  Findings:  There are no pulmonary emboli, infiltrates, or effusions.  No significant osseous abnormality.  Heart size is normal.  Evidence of prior CABG.  Extensive atherosclerotic disease of the thoracic aorta.  Review of the MIP images confirms the above findings.  IMPRESSION:  1.  No pulmonary emboli. 2.  No acute abnormalities.  Original Report Authenticated By: Gwynn Burly, M.D.   Dg Chest Port 1 View  05/28/2011  *RADIOLOGY REPORT*  Clinical Data: Shortness of breath  PORTABLE CHEST - 1 VIEW  Comparison: 04/26/2011  Findings: Stable postoperative changes in the mediastinum. Hyperinflation suggesting emphysema.  Heart size and pulmonary vascularity are normal.  No focal airspace consolidation in the lungs.  No pneumothorax.  No blunting of costophrenic angles. Calcification of the aorta.  IMPRESSION: Probable emphysematous changes in the lungs.  No evidence of active pulmonary disease.  Original Report Authenticated By: Marlon Pel, M.D.    EKG:  NSR with LBBB  ASSESSMENT:  1. Very minimal shortness of breath that was transient in the setting of a systolic blood pressure  at home of 170 mm mercury. No complaints of chest pain or pressure. Cardiac enzymes are negative x1.  EKG shows chronic left bundle branch block 2. Hypertension with improved blood pressure of 154/61 mm mercury 3. CAD status post CABG in 1996 with PCI to saphenous vein graft to the OM and 2007 4. Diabetes mellitus 5. Dyslipidemia 6. Renal artery stenosis status post bilateral stents 7. Diverticulosis 8. Hiatal hernia 9. Iron deficiency anemia 10. Hypothyroidism  PLAN:   1.  Will recheck cardiac enzymes and if normal will discharge to home.  I have spoken with Dr. Eldridge Dace who will see the patient back in consultation in the office later this week. 2.  The patient was instructed to call the office for an office appointment with Dr. Eldridge Dace 3. Continue current medications   Quintella Reichert, MD  05/28/2011  10:07 AM

## 2011-05-28 NOTE — ED Notes (Signed)
Patient currently sitting up in bed; no respiratory or acute distress noted.  Patient updated on plan of care; informed patient that he will be sent for a CT Angio scan; patient has no other questions or concerns at this time.  Family present at bedside.  Will continue to monitor.

## 2011-05-28 NOTE — ED Notes (Signed)
X-ray at bedside

## 2011-05-28 NOTE — ED Notes (Signed)
Pt back from radiology, assumed care, cardiology at bedside.

## 2011-05-28 NOTE — ED Provider Notes (Addendum)
History     CSN: [id number] Arrival date & time: 05/28/2011  3:45 AM   First MD Initiated Contact with Patient 05/28/11 [phone]      Chief Complaint  Patient presents with  . Shortness of Breath    corrected per patient report    (Consider location/radiation/quality/duration/timing/severity/associated sxs/prior treatment) Patient is a 77 y.o. male presenting with shortness of breath. The history is provided by the patient. No language interpreter was used.  Shortness of Breath  The current episode started today. The onset was sudden. The problem occurs continuously. The problem has been rapidly worsening. The problem is severe. The symptoms are relieved by nothing. The symptoms are aggravated by nothing. Associated symptoms include shortness of breath. Pertinent negatives include no chest pain, no chest pressure, no orthopnea, no fever, no rhinorrhea, no sore throat, no stridor, no cough and no wheezing. Associated symptoms comments: "just didn't feel good". There was no intake of a foreign body. He was not exposed to toxic fumes. He has not inhaled smoke recently. His past medical history does not include asthma. He has been behaving normally. There were no sick contacts. Recently, medical care has been given at this facility. Services received include tests performed.  Denies CP, n/v/d.   Past Medical History  Diagnosis Date  . Coronary artery disease   . Diabetes mellitus   . Hypertension   . Throat cancer     Past Surgical History  Procedure Date  . Tracheostomy     No family history on file.  History  Substance Use Topics  . Smoking status: Former Games developer  . Smokeless tobacco: Not on file  . Alcohol Use: No      Review of Systems  Constitutional: Negative for fever.  HENT: Negative for sore throat, facial swelling and rhinorrhea.   Eyes: Negative for discharge.  Respiratory: Positive for shortness of breath. Negative for cough, wheezing and stridor.     Cardiovascular: Negative for chest pain and orthopnea.  Gastrointestinal: Negative for abdominal distention.  Genitourinary: Negative for difficulty urinating.  Musculoskeletal: Negative for arthralgias.  Neurological: Negative for dizziness.  Hematological: Negative.   Psychiatric/Behavioral: Negative.     Allergies  Review of patient's allergies indicates no known allergies.  Home Medications   Current Outpatient Rx  Name Route Sig Dispense Refill  . AMLODIPINE BESYLATE 5 MG PO TABS Oral Take 5 mg by mouth daily.      Marland Kitchen CLOPIDOGREL BISULFATE 75 MG PO TABS Oral Take 75 mg by mouth daily.      Marland Kitchen FINASTERIDE 5 MG PO TABS Oral Take 5 mg by mouth daily.      Marland Kitchen FLUTICASONE PROPIONATE 50 MCG/ACT NA SUSP Nasal Place 2 sprays into the nose daily.      Marland Kitchen GLIPIZIDE 5 MG PO TABS Oral Take 10 mg by mouth 2 (two) times daily before a meal.      . LEVOTHYROXINE SODIUM 50 MCG PO TABS Oral Take 50 mcg by mouth every morning.      Marland Kitchen METFORMIN HCL 500 MG PO TABS Oral Take 1,000 mg by mouth 2 (two) times daily with a meal.      . ACARBOSE 100 MG PO TABS Oral Take 100 mg by mouth 3 (three) times daily with meals.      Marland Kitchen LISINOPRIL-HYDROCHLOROTHIAZIDE 20-12.5 MG PO TABS Oral Take 2 tablets by mouth daily.        BP 175/64  Pulse 78  Temp(Src) 97.7 F (36.5 C) (Oral)  Resp 18  SpO2 99%  Physical Exam  Constitutional: He appears well-developed and well-nourished.  HENT:  Head: Normocephalic and atraumatic.  Eyes: EOM are normal. Pupils are equal, round, and reactive to light.  Neck: Normal range of motion. Neck supple.  Cardiovascular: Normal rate and regular rhythm.   Pulmonary/Chest: Effort normal and breath sounds normal. He has no wheezes.  Abdominal: Soft. Bowel sounds are normal. There is no tenderness. There is no rebound and no guarding.  Musculoskeletal: Normal range of motion.  Neurological: He is alert.  Skin: Skin is warm and dry. He is not diaphoretic.  Psychiatric: He has a  normal mood and affect.    ED Course  Procedures (including critical care time)   Labs Reviewed  CBC  DIFFERENTIAL  I-STAT, CHEM 8  I-STAT TROPONIN I   No results found.   No diagnosis found.    MDM   Date: 05/28/2011  Rate: 81  Rhythm: normal sinus rhythm  QRS Axis: left  Intervals: normal  ST/T Wave abnormalities: nonspecific ST changes  Conduction Disutrbances:none  Narrative Interpretation: LBBB  Old EKG Reviewed: unchanged         Shamir Tuzzolino K Kathan Kirker-Rasch, MD 05/28/11 1610  Orbie Grupe K Jenine Krisher-Rasch, MD 05/28/11 [id number]  Batool Majid K Dequan Kindred-Rasch, MD 05/28/11 [phone]

## 2011-05-28 NOTE — ED Notes (Signed)
Patient states that he woke up in the middle of the night tonight and "did not feel right" (i.e. Shortness of breath; possible chest pain).  Patient woke up wife stating that he wanted to go to the hospital for evaluation.  Patient denies chest pain, but wife feels that he was having chest pain upon waking up this morning.  Patient denies pain (including chest pain), numbness/tingling in hands/feet, nausea/vomiting, diaphoresis, and blurred vision -- patient just reports "not feeling right".  Upon arrival to room, patient changed into gown and connected to continuous cardiac, pulse ox, and blood pressure monitor.  Family present at bedside.  Will continue to monitor.

## 2011-05-28 NOTE — ED Notes (Signed)
Patient currently sitting up in bed; no respiratory or acute distress noted.  Family present at bedside.  Updated patient on plan of care; informed patient that the EDP will be in to talk about lab results.  Patient given urinal.  Patient has no other questions or concerns at this time.  Will continue to monitor.

## 2011-06-24 ENCOUNTER — Emergency Department (HOSPITAL_COMMUNITY)
Admission: EM | Admit: 2011-06-24 | Discharge: 2011-06-24 | Disposition: A | Discharge status: Home / Self Care | Payer: Medicare Other | Attending: Emergency Medicine | Admitting: Emergency Medicine

## 2011-06-24 ENCOUNTER — Other Ambulatory Visit: Payer: Self-pay

## 2011-06-24 ENCOUNTER — Emergency Department (HOSPITAL_COMMUNITY): Payer: Medicare Other

## 2011-06-24 ENCOUNTER — Encounter: Payer: Self-pay | Admitting: *Deleted

## 2011-06-24 DIAGNOSIS — Z79899 Other long term (current) drug therapy: Secondary | ICD-10-CM | POA: Insufficient documentation

## 2011-06-24 DIAGNOSIS — R05 Cough: Secondary | ICD-10-CM | POA: Insufficient documentation

## 2011-06-24 DIAGNOSIS — R0609 Other forms of dyspnea: Secondary | ICD-10-CM | POA: Insufficient documentation

## 2011-06-24 DIAGNOSIS — E119 Type 2 diabetes mellitus without complications: Secondary | ICD-10-CM | POA: Insufficient documentation

## 2011-06-24 DIAGNOSIS — J449 Chronic obstructive pulmonary disease, unspecified: Secondary | ICD-10-CM

## 2011-06-24 DIAGNOSIS — R059 Cough, unspecified: Secondary | ICD-10-CM | POA: Insufficient documentation

## 2011-06-24 DIAGNOSIS — I10 Essential (primary) hypertension: Secondary | ICD-10-CM | POA: Insufficient documentation

## 2011-06-24 DIAGNOSIS — Z85819 Personal history of malignant neoplasm of unspecified site of lip, oral cavity, and pharynx: Secondary | ICD-10-CM | POA: Insufficient documentation

## 2011-06-24 DIAGNOSIS — J4489 Other specified chronic obstructive pulmonary disease: Secondary | ICD-10-CM | POA: Insufficient documentation

## 2011-06-24 DIAGNOSIS — R0602 Shortness of breath: Secondary | ICD-10-CM | POA: Insufficient documentation

## 2011-06-24 DIAGNOSIS — R0989 Other specified symptoms and signs involving the circulatory and respiratory systems: Secondary | ICD-10-CM | POA: Insufficient documentation

## 2011-06-24 LAB — BASIC METABOLIC PANEL
GFR calc Af Amer: 88 mL/min — ABNORMAL LOW (ref >90)
GFR calc non Af Amer: 76 mL/min — ABNORMAL LOW (ref >90)
Glucose, Bld: 259 mg/dL — ABNORMAL HIGH (ref 70–99)
Potassium: 3.8 mEq/L (ref 3.5–5.1)
Sodium: 130 mEq/L — ABNORMAL LOW (ref 135–145)

## 2011-06-24 LAB — DIFFERENTIAL
Basophils Absolute: 0 10*3/uL (ref 0.0–0.1)
Basophils Relative: 0 % (ref 0–1)
Eosinophils Absolute: 0 10*3/uL (ref 0.0–0.7)
Lymphs Abs: 0.8 10*3/uL (ref 0.7–4.0)
Neutrophils Relative %: 79 % — ABNORMAL HIGH (ref 43–77)

## 2011-06-24 LAB — TROPONIN I: Troponin I: <0.3 ng/mL (ref <0.30)

## 2011-06-24 LAB — CBC
MCH: 30.2 pg (ref 26.0–34.0)
Platelets: 270 10*3/uL (ref 150–400)
RBC: 3.77 MIL/uL — ABNORMAL LOW (ref 4.22–5.81)

## 2011-06-24 LAB — PRO B NATRIURETIC PEPTIDE: Pro B Natriuretic peptide (BNP): 537.5 pg/mL — ABNORMAL HIGH (ref 0–450)

## 2011-06-24 MED ORDER — PREDNISONE 50 MG PO TABS: 50.0000 mg | ORAL_TABLET | Freq: Every day | ORAL | Status: DC

## 2011-06-24 MED ORDER — IPRATROPIUM BROMIDE 0.02 % IN SOLN
0.5000 mg | Freq: Once | RESPIRATORY_TRACT | Status: AC
  Administered 2011-06-24: 0.5 mg via RESPIRATORY_TRACT
  Filled 2011-06-24: qty 2.5

## 2011-06-24 MED ORDER — PREDNISONE 20 MG PO TABS
60.0000 mg | ORAL_TABLET | Freq: Once | ORAL | Status: AC
  Administered 2011-06-24: 60 mg via ORAL
  Filled 2011-06-24: qty 3

## 2011-06-24 MED ORDER — ALBUTEROL SULFATE (5 MG/ML) 0.5% IN NEBU
2.5000 mg | INHALATION_SOLUTION | Freq: Once | RESPIRATORY_TRACT | Status: AC
  Administered 2011-06-24: 2.5 mg via RESPIRATORY_TRACT
  Filled 2011-06-24: qty 0.5

## 2011-06-24 NOTE — ED Notes (Signed)
Pt states saw his doctor for same last week and was told "my heart and lungs are good"

## 2011-06-24 NOTE — ED Notes (Signed)
Chronic symptoms for years, but worse in 3 weeks; chronic productive (yellow) - seasonale; voice hoarse. Pt. Has a tracheostomy since 1947 - talking by holding his thumb.

## 2011-06-24 NOTE — ED Provider Notes (Signed)
History     CSN: [id number]  Arrival date & time 06/24/11  4098   First MD Initiated Contact with Patient 06/24/11 0940      Chief Complaint  Patient presents with  . Shortness of Breath    (Consider location/radiation/quality/duration/timing/severity/associated sxs/prior treatment) The history is provided by the patient.   78 year old male comes in with gradually increasing dyspnea over the last 3 weeks. Dyspnea is moderate to severe and is worse when he is in a hot room. He states as long as he is outside in the cool air, he feels fine. There's been a slight cough. Denies any chest pain, heaviness, tightness, or pressure. Denies fever, chills, sweats. He has used an albuterol inhaler at bedtime with relief, but has not used it during the daytime. Dyspnea is not affected by exertion or by lying down or standing up. There is no associated nausea, vomiting, arthralgias or myalgias. Past Medical History  Diagnosis Date  . Hypertension   . Diabetes mellitus   . Throat cancer     Past Surgical History  Procedure Date  . Tracheostomy     History reviewed. No pertinent family history.  History  Substance Use Topics  . Smoking status: Not on file  . Smokeless tobacco: Not on file  . Alcohol Use: No      Review of Systems  All other systems reviewed and are negative.    Allergies  Review of patient's allergies indicates no known allergies.  Home Medications   Current Outpatient Rx  Name Route Sig Dispense Refill  . ACARBOSE 100 MG PO TABS Oral Take 100 mg by mouth 3 (three) times daily with meals.      . ALBUTEROL SULFATE HFA 108 (90 BASE) MCG/ACT IN AERS Inhalation Inhale 2 puffs into the lungs at bedtime.      Marland Kitchen AMLODIPINE BESYLATE 10 MG PO TABS Oral Take 10 mg by mouth daily.      Marland Kitchen CLOPIDOGREL BISULFATE 75 MG PO TABS Oral Take 75 mg by mouth daily.      Marland Kitchen FINASTERIDE 5 MG PO TABS Oral Take 5 mg by mouth daily.      Marland Kitchen GLIPIZIDE 10 MG PO TABS Oral Take 10 mg by  mouth 2 (two) times daily before a meal.      . LEVOTHYROXINE SODIUM 50 MCG PO TABS Oral Take 50 mcg by mouth daily.      Marland Kitchen LISINOPRIL-HYDROCHLOROTHIAZIDE 20-12.5 MG PO TABS Oral Take 2 tablets by mouth daily.      Marland Kitchen METFORMIN HCL 500 MG PO TABS Oral Take 500 mg by mouth 2 (two) times daily with a meal.      . TRAMADOL-ACETAMINOPHEN 37.5-325 MG PO TABS Oral Take 1 tablet by mouth every 6 (six) hours as needed. Back pain       BP 117/49  Pulse 87  Temp(Src) 98.2 F (36.8 C) (Oral)  Resp 20  Ht 6' (1.829 m)  Wt 150 lb (68.04 kg)  BMI 20.34 kg/m2  SpO2 100%  Physical Exam  Nursing note and vitals reviewed.  78 year old male who is resting comfortably and in no acute distress. Vital signs are normal. Oxygen saturation is 100% which is normal. Head is normocephalic and atraumatic. Oropharynx is clear. Neck is supple without adenopathy or JVD. Tracheostomy is present with clean appearing stoma. Back is nontender. Lungs are clear without rales, wheezes, or rhonchi. However, unfortunately she, prolonged exhalation is noted and coarse rhonchi and wheezes are noted. Heart has regular rate  and rhythm without murmur. Abdomen is soft, flat, nontender without masses or hepatosplenomegaly. Extremities have no cyanosis or edema, full range of motion present. Skin is warm and moist without rash. Neurologic: Mental status is normal, cranial nerves are intact, there no focal motor Center deficits. Psychiatric: No abnormalities of mood or affect. ED Course  Procedures (including critical care time)   Labs Reviewed  CBC  DIFFERENTIAL  BASIC METABOLIC PANEL  PRO B NATRIURETIC PEPTIDE  TROPONIN I   No results found. Results for orders placed during the hospital encounter of 06/24/11  CBC      Component Value Range   WBC 6.3  4.0 - 10.5 (K/uL)   RBC 3.77 (*) 4.22 - 5.81 (MIL/uL)   Hemoglobin 11.4 (*) 13.0 - 17.0 (g/dL)   HCT 16.1 (*) 09.6 - 52.0 (%)   MCV 88.6  78.0 - 100.0 (fL)   MCH 30.2  26.0 -  34.0 (pg)   MCHC 34.1  30.0 - 36.0 (g/dL)   RDW 04.5  40.9 - 81.1 (%)   Platelets 270  150 - 400 (K/uL)  DIFFERENTIAL      Component Value Range   Neutrophils Relative 79 (*) 43 - 77 (%)   Neutro Abs 4.9  1.7 - 7.7 (K/uL)   Lymphocytes Relative 13  12 - 46 (%)   Lymphs Abs 0.8  0.7 - 4.0 (K/uL)   Monocytes Relative 8  3 - 12 (%)   Monocytes Absolute 0.5  0.1 - 1.0 (K/uL)   Eosinophils Relative 0  0 - 5 (%)   Eosinophils Absolute 0.0  0.0 - 0.7 (K/uL)   Basophils Relative 0  0 - 1 (%)   Basophils Absolute 0.0  0.0 - 0.1 (K/uL)  BASIC METABOLIC PANEL      Component Value Range   Sodium 130 (*) 135 - 145 (mEq/L)   Potassium 3.8  3.5 - 5.1 (mEq/L)   Chloride 93 (*) 96 - 112 (mEq/L)   CO2 26  19 - 32 (mEq/L)   Glucose, Bld 259 (*) 70 - 99 (mg/dL)   BUN 22  6 - 23 (mg/dL)   Creatinine, Ser 9.14  0.50 - 1.35 (mg/dL)   Calcium 8.8  8.4 - 78.2 (mg/dL)   GFR calc non Af Amer 76 (*) >90 (mL/min)   GFR calc Af Amer 88 (*) >90 (mL/min)  PRO B NATRIURETIC PEPTIDE      Component Value Range   Pro B Natriuretic peptide (BNP) 537.5 (*) 0 - 450 (pg/mL)  TROPONIN I      Component Value Range   Troponin I <0.30  <0.30 (ng/mL)   Dg Chest 2 View  06/24/2011  *RADIOLOGY REPORT*  Clinical Data: Dyspnea.  CHEST - 2 VIEW  Comparison: None.  Findings: Normal sized heart.  Post CABG changes.  Clear lungs. The lungs are mildly hyperexpanded.  Diffuse osteopenia.  IMPRESSION: Mild changes of COPD.  No acute abnormality.  Original Report Authenticated By: Darrol Angel, M.D.      No diagnosis found.   Date: 06/24/2011  Rate: 73  Rhythm: normal sinus rhythm  QRS Axis: normal  Intervals: normal  ST/T Wave abnormalities: normal  Conduction Disutrbances:left bundle branch block  Narrative Interpretation: Left bundle-branch block. When compared with ECG of 05/28/2011, no significant changes are seen.  Old EKG Reviewed: none available and unchanged  He is feeling better after an albuterol with  Atrovent nebulizer treatment. Workup shows no evidence of infection and no significant congestive heart failure.  He will be treated with a short course of prednisone. I've advised him to keep a close watch on his blood sugar because of the potential worsening of diabetic control while on steroids.  Impression: COPD exacerbation  He'll be sent home with a five-day course of prednisone 50 mg a day. He is to use his inhaler as needed.  MDM  Dyspnea which is most likely related to his COPD exacerbation. Workup will be done to rule out cardiac causes and pneumonia.        Dione Booze, MD 06/24/11 1214

## 2011-06-27 ENCOUNTER — Encounter (HOSPITAL_COMMUNITY): Payer: Self-pay

## 2011-08-17 HISTORY — PX: SP PTA PERIPHERAL: HXRAD401

## 2011-08-20 ENCOUNTER — Other Ambulatory Visit: Payer: Self-pay | Admitting: Interventional Cardiology

## 2011-08-23 ENCOUNTER — Ambulatory Visit (HOSPITAL_COMMUNITY)
Admission: RE | Admit: 2011-08-23 | Discharge: 2011-08-23 | Disposition: A | Discharge status: Home / Self Care | Payer: Medicare Other | Source: Ambulatory Visit | Attending: Interventional Cardiology | Admitting: Interventional Cardiology

## 2011-08-23 ENCOUNTER — Other Ambulatory Visit: Payer: Self-pay

## 2011-08-23 ENCOUNTER — Encounter (HOSPITAL_COMMUNITY)
Admission: RE | Disposition: A | Discharge status: Home / Self Care | Payer: Self-pay | Source: Ambulatory Visit | Attending: Interventional Cardiology

## 2011-08-23 DIAGNOSIS — L98499 Non-pressure chronic ulcer of skin of other sites with unspecified severity: Secondary | ICD-10-CM | POA: Insufficient documentation

## 2011-08-23 DIAGNOSIS — I739 Peripheral vascular disease, unspecified: Secondary | ICD-10-CM | POA: Insufficient documentation

## 2011-08-23 HISTORY — PX: LOWER EXTREMITY ANGIOGRAM: SHX5508

## 2011-08-23 HISTORY — PX: PERCUTANEOUS STENT INTERVENTION: SHX5500

## 2011-08-23 LAB — GLUCOSE, CAPILLARY: Glucose-Capillary: 126 mg/dL — ABNORMAL HIGH (ref 70–99)

## 2011-08-23 SURGERY — ANGIOGRAM, LOWER EXTREMITY
Anesthesia: LOCAL

## 2011-08-23 MED ORDER — HEPARIN SODIUM (PORCINE) 1000 UNIT/ML IJ SOLN
INTRAMUSCULAR | Status: AC
  Filled 2011-08-23: qty 1

## 2011-08-23 MED ORDER — HEPARIN (PORCINE) IN NACL 2-0.9 UNIT/ML-% IJ SOLN
INTRAMUSCULAR | Status: AC
  Filled 2011-08-23: qty 2000

## 2011-08-23 MED ORDER — LIDOCAINE HCL (PF) 1 % IJ SOLN
INTRAMUSCULAR | Status: AC
  Filled 2011-08-23: qty 30

## 2011-08-23 MED ORDER — CLOPIDOGREL BISULFATE 300 MG PO TABS: 600.0000 mg | ORAL_TABLET | Freq: Once | ORAL | Status: DC

## 2011-08-23 MED ORDER — CLOPIDOGREL BISULFATE 75 MG PO TABS: 75.0000 mg | ORAL_TABLET | Freq: Every day | ORAL | Status: DC

## 2011-08-23 MED ORDER — METFORMIN HCL 500 MG PO TABS: 1000.0000 mg | ORAL_TABLET | Freq: Two times a day (BID) | ORAL | Status: DC

## 2011-08-23 MED ORDER — SODIUM CHLORIDE 0.9 % IJ SOLN: 3.0000 mL | INTRAMUSCULAR | Status: DC | PRN

## 2011-08-23 MED ORDER — ASPIRIN 81 MG PO CHEW
324.0000 mg | CHEWABLE_TABLET | ORAL | Status: AC
  Administered 2011-08-23: 324 mg via ORAL
  Filled 2011-08-23: qty 4

## 2011-08-23 MED ORDER — SODIUM CHLORIDE 0.9 % IV SOLN: 1.0000 mL/kg/h | INTRAVENOUS | Status: DC

## 2011-08-23 MED ORDER — CLOPIDOGREL BISULFATE 300 MG PO TABS
ORAL_TABLET | ORAL | Status: AC
  Filled 2011-08-23: qty 1

## 2011-08-23 MED ORDER — ONDANSETRON HCL 4 MG/2ML IJ SOLN: 4.0000 mg | Freq: Four times a day (QID) | INTRAMUSCULAR | Status: DC | PRN

## 2011-08-23 MED ORDER — ASPIRIN 81 MG PO CHEW: 81.0000 mg | CHEWABLE_TABLET | Freq: Every day | ORAL | Status: DC

## 2011-08-23 MED ORDER — SODIUM CHLORIDE 0.9 % IV SOLN: 250.0000 mL | INTRAVENOUS | Status: DC | PRN

## 2011-08-23 MED ORDER — CLOPIDOGREL BISULFATE 300 MG PO TABS
300.0000 mg | ORAL_TABLET | Freq: Once | ORAL | Status: AC
  Administered 2011-08-23: 300 mg via ORAL

## 2011-08-23 MED ORDER — DIAZEPAM 5 MG PO TABS
5.0000 mg | ORAL_TABLET | ORAL | Status: AC
  Administered 2011-08-23: 5 mg via ORAL
  Filled 2011-08-23: qty 1

## 2011-08-23 MED ORDER — SODIUM CHLORIDE 0.9 % IJ SOLN: 3.0000 mL | Freq: Two times a day (BID) | INTRAMUSCULAR | Status: DC

## 2011-08-23 MED ORDER — ACETAMINOPHEN 325 MG PO TABS: 650.0000 mg | ORAL_TABLET | ORAL | Status: DC | PRN

## 2011-08-23 MED ORDER — SODIUM CHLORIDE 0.9 % IV SOLN
INTRAVENOUS | Status: DC
  Administered 2011-08-23: 08:00:00 via INTRAVENOUS

## 2011-08-23 NOTE — H&P (Signed)
Date of Initial H&P: 08/23/11  History reviewed, patient examined, no change in status, stable for surgery.

## 2011-08-23 NOTE — CV Procedure (Signed)
PROCEDURE:  Abdominal aortogram; pelvic angiogram; selective right lower extremity runoff; selective left lower extrmity runoff; PTA/stent to the left external iliac artery. Selective right renal angiogram.  INDICATIONS:  Claudication; left foot  ulcer.  The risks, benefits, and details of the procedure were explained to the patient.  The patient verbalized understanding and wanted to proceed.  Informed written consent was obtained.  PROCEDURE TECHNIQUE:  After Xylocaine anesthesia a 8F sheath was placed in the right femoral artery with a single anterior needle wall stick.   A pigtail catheter was used to perform abdominal aortogram.  The catheter was pulled back to the aortoiliac bifurcation and another power injection of contrast were performed.  A selective right lower extremity runoff was performed through the right femoral sheath.  A crossover catheter was advanced to the aortoiliac bifurcation and a versa core wire was placed into the left external iliac.  A 5 French end hole catheter was advanced to the left external iliac artery.  A selective left lower extremity runoff was performed.  The case was discussed with Dr. Imogene Burn from vascular surgery.  Given the severe lower extremity disease, there were no percutaneous options below the hip.  He recommended treating the external iliac lesion with a covered stent due to the fact it was very calcified and there is a risk of rupture.  He would later plan on performing a left profunda femoral endarterectomy to enhance collateral circulation to the left leg.  The intervention was then performed.  A 7 French 45 cm sheath was advanced from the right femoral artery into the left common iliac artery over an Amplatz superstiff wire.  Heparin was used for anticoagulation.    The wire was changed out for a versa core wire.  A 6 millimeter x 20 millimeter balloon was used to predilate the lesion in the external iliac.  An 8 x 38 icast covered stent was  Deployed at 14  atmospheres.  The stent was postdilated with a 9 mm x 20 mm balloon for 2 inflations, predominantly in the distal portion of the stent.  There is an excellent angiographic result no residual stenosis.  The crossover catheter is in used to selectively engage the right renal artery.  Angiography was performed.  There is no pressure gradient.   CONTRAST:  Total of 200 cc.  COMPLICATIONS:  None.    HEMODYNAMICS:  No significant gradient across the in-stent restenosis of the right renal artery.  Approximately 20 mm gradient across the stenosis in the left external iliac artery.  ANGIOGRAPHIC DATA:    no abdominal aortic aneurysm.  Widely patent left renal stent seen on the abdominal aortogram.  Patency of the right renal stent was unclear by aortogram.  A selective renal angiogram revealed that there was 20% proximal in-stent restenosis with no pressure gradient.  The aortoiliac bifurcation was widely patent.  The right common and external iliac arteries were widely patent.  There was calcification noted in the common iliac artery.  The right internal iliac artery is diseased but patent.  The left common iliac artery has mild calcification but is widely patent.  The left internal iliac artery is patent with mild disease.  The left external iliac artery has a 70% stenosis with a 15-20 mm gradient across the blockage.  The stenosis is calcified.    RIGHT LOWER EXTREMITY: There is heavy calcification in the right common femoral artery.  Calcification is noted throughout the SFA and right profunda femoral artery.  The profunda femoral  artery is patent.  The SFA has severe proximal disease shortly after the origin up to 80%.  There is moderate atherosclerosis in the proximal SFA.  The mid SFA is occluded.  There are collaterals throughout the thigh.  The SFA occlusion extends into the popliteal artery and into the origin of all 3 tibial vessels.  The collaterals reconstitute the posterior tibial artery and the  distal peroneal artery.  LEFT LOWER EXTREMITY:  There is heavy calcification in the left common femoral artery.  Calcification is noted in the proximal left SFA and profunda femoral artery.  The SFA appears occluded at its ostium.  There is 90%, heavily calcified stenosis in the profunda femoral artery.  The profunda femoral artery supplies collaterals in the thigh and there is reconstitution of the popliteal artery.  The popliteal artery is severely diseased at the joint space with an 90% stenosis.  The left anterior tibial  artery is occluded proximally.  The tibial peroneal trunk is patent.  The peroneal artery is occluded proximally.  The left posterior tibial artery is patent but heavily diseased diffusely.    IMPRESSIONS:  1. Severe bilateral LE arterial disease.  Bilateral occluded SFAs.  Both are long occlusions.  Reconstitution of the vessels in the right leg occurs at the level of the tibial vessels.  In the left leg, there is reconstitution of the popliteal artery.  There is a heavily calcified, 90% stenosis in the left profunda femoral artery. 2.   PTA/stent to the left external iliac artery with an 8 x 38 covered stent, postdilated with a 9.0 x 2 cm balloon. 3.   Patent renal artery stents bilaterally with only mild in-stent restenosis.    RECOMMENDATION:  The patient will need to stay on dual antiplatelet therapy indefinitely.  We'll see if his left foot ulcer heals.  The patient will followup with Dr. Imogene Burn as well regarding possible endarterectomy of the left profunda femoral artery.  The patient will be hydrated.  If there are no groin complications, he will go home later today.

## 2011-08-23 NOTE — Discharge Instructions (Addendum)
No lifting more than 10 lbs for 1 week. Restart metformin on Saturday 3/9.Groin Site Care Refer to this sheet in the next few weeks. These instructions provide you with information on caring for yourself after your procedure. Your caregiver may also give you more specific instructions. Your treatment has been planned according to current medical practices, but problems sometimes occur. Call your caregiver if you have any problems or questions after your procedure. HOME CARE INSTRUCTIONS  You may shower 24 hours after the procedure. Remove the bandage (dressing) and gently wash the site with plain soap and water. Gently pat the site dry.   Do not apply powder or lotion to the site.   Do not sit in a bathtub, swimming pool, or whirlpool for 5 to 7 days.   No bending, squatting, or lifting anything over 10 pounds (4.5 kg) as directed by your caregiver.   Inspect the site at least twice daily.   Do not drive home if you are discharged the same day of the procedure. Have someone else drive you.   You may drive 24 hours after the procedure unless otherwise instructed by your caregiver.  What to expect:  Any bruising will usually fade within 1 to 2 weeks.   Blood that collects in the tissue (hematoma) may be painful to the touch. It should usually decrease in size and tenderness within 1 to 2 weeks.  SEEK IMMEDIATE MEDICAL CARE IF:  You have unusual pain at the groin site or down the affected leg.   You have redness, warmth, swelling, or pain at the groin site.   You have drainage (other than a small amount of blood on the dressing).   You have chills.   You have a fever or persistent symptoms for more than 72 hours.   You have a fever and your symptoms suddenly get worse.   Your leg becomes pale, cool, tingly, or numb.   You have heavy bleeding from the site. Hold pressure on the site.  Document Released: 07/07/2010 Document Revised: 05/24/2011 Document Reviewed:  07/07/2010 [facility] Patient Information 2012 [place], [place].

## 2011-08-24 LAB — POCT ACTIVATED CLOTTING TIME
Activated Clotting Time: 166 seconds
Activated Clotting Time: 177 seconds

## 2011-09-07 ENCOUNTER — Other Ambulatory Visit: Payer: Self-pay

## 2011-09-07 DIAGNOSIS — I6529 Occlusion and stenosis of unspecified carotid artery: Secondary | ICD-10-CM

## 2011-10-17 HISTORY — PX: SP PTA PERIPHERAL: HXRAD401

## 2011-10-18 ENCOUNTER — Encounter: Payer: Self-pay | Admitting: Vascular Surgery

## 2011-10-19 ENCOUNTER — Other Ambulatory Visit (INDEPENDENT_AMBULATORY_CARE_PROVIDER_SITE_OTHER): Payer: Medicare Other | Admitting: *Deleted

## 2011-10-19 ENCOUNTER — Encounter: Payer: Self-pay | Admitting: Vascular Surgery

## 2011-10-19 ENCOUNTER — Ambulatory Visit (INDEPENDENT_AMBULATORY_CARE_PROVIDER_SITE_OTHER): Payer: Medicare Other | Admitting: Vascular Surgery

## 2011-10-19 VITALS — BP 152/63 | HR 73 | Temp 97.8°F | Ht 71.0 in | Wt 154.0 lb

## 2011-10-19 DIAGNOSIS — I6529 Occlusion and stenosis of unspecified carotid artery: Secondary | ICD-10-CM

## 2011-10-19 NOTE — Progress Notes (Addendum)
[facility]  Referred by:  Corky Crafts, MD [address] [place], [place] [address]  Reason for referral: Left profunda stenosis  History of Present Illness  [name] is a 78 y.o. (1923-04-16) male who presents with chief complaint: bilateral leg pain.  Onset of symptom occurred unknown exact chronology but at least 3 months prior.  Pain is described as cramping calf and foot pain, severity 3-6/10, and associated with ambulation.  Patient has attempted to treat this pain with rest, medication and walking pain.  The patient can walk about 7 minutes before having symptoms.  The patient has not have rest pain symptoms also and previously had a left foot ulcer, which has healed at this point.  At this point, he feels his right foot sx are worsen than his left.  Atherosclerotic risk factors include: diabetes, hyperlipidemia, and distal smoking history.  Past Medical History  Diagnosis Date  . Coronary artery disease     s/p CABG 1996 then PCI of SVG to OM 2007  . Diverticulosis   . Dyslipidemia   . Renal artery stenosis     s/p bilateral stents  . Hiatal hernia   . Colon polyps   . Carotid artery stenosis   . Peripheral neuropathy   . Anemia   . Hypothyroidism   . Nasal polyps   . Hypertension   . Diabetes mellitus   . Throat cancer   . Myocardial infarction   . Peripheral vascular disease     Past Surgical History  Procedure Date  . Coronary artery bypass graft     1996  . Cardiac catheterization     PCI of SVG to OM 2007  . Renal artery stent   . Tracheostomy   . Appendectomy     History   Social History  . Marital Status: Married    Spouse Name: [name]    Number of Children: [name]  . Years of Education: [name]   Occupational History  . Not on file.   Social History Main Topics  . Smoking status: Former Smoker -- 0.3 packs/day    Types: Cigarettes    Quit date: 06/18/1946  . Smokeless tobacco: Not on file  .  Alcohol Use: No  . Drug Use: No  . Sexually Active: Not on file   Other Topics Concern  . Not on file   Social History Narrative   ** Merged History Encounter **     Family History  Problem Relation Age of Onset  . Hypertension Mother   . Heart attack Brother   . Diabetes Son     Current Outpatient Prescriptions on File Prior to Visit  Medication Sig Dispense Refill  . acarbose (PRECOSE) 100 MG tablet Take 100 mg by mouth 3 (three) times daily with meals.       Marland Kitchen albuterol (PROVENTIL HFA;VENTOLIN HFA) 108 (90 BASE) MCG/ACT inhaler Inhale 2 puffs into the lungs at bedtime.       Marland Kitchen amLODipine (NORVASC) 10 MG tablet Take 10 mg by mouth. Pt states he does not know the frequency      . aspirin 81 MG chewable tablet Chew 81 mg by mouth daily.       Marland Kitchen atorvastatin (LIPITOR) 10 MG tablet Take 10 mg by mouth daily.      . clopidogrel (PLAVIX) 75 MG tablet Take 75 mg by mouth daily.       Marland Kitchen doxycycline (VIBRA-TABS) 100 MG tablet Take 100  mg by mouth 2 (two) times daily.      . finasteride (PROSCAR) 5 MG tablet Take 5 mg by mouth daily.       . fluticasone (FLONASE) 50 MCG/ACT nasal spray Place 1 spray into the nose daily.       Marland Kitchen glipiZIDE (GLUCOTROL) 10 MG tablet Take 10 mg by mouth 2 (two) times daily before a meal.       . HYDROcodone-acetaminophen (VICODIN) 5-500 MG per tablet Take 1 tablet by mouth every 6 (six) hours as needed. For pain      . levothyroxine (SYNTHROID, LEVOTHROID) 50 MCG tablet Take 50 mcg by mouth daily before breakfast.       . levothyroxine (SYNTHROID, LEVOTHROID) 50 MCG tablet Take 50 mcg by mouth daily.        Marland Kitchen lisinopril-hydrochlorothiazide (PRINZIDE,ZESTORETIC) 20-12.5 MG per tablet Take 2 tablets by mouth daily.       . metFORMIN (GLUCOPHAGE) 500 MG tablet Take 2 tablets (1,000 mg total) by mouth 2 (two) times daily with a meal.      . mirtazapine (REMERON) 15 MG tablet Take 7.5 mg by mouth at bedtime.      . Multiple Vitamin (MULITIVITAMIN WITH MINERALS)  TABS Take 1 tablet by mouth daily.      . sertraline (ZOLOFT) 25 MG tablet Take 25 mg by mouth daily. Takes with 50mg       . sertraline (ZOLOFT) 50 MG tablet Take 50 mg by mouth daily. Takes with 25mg       . DISCONTD: carvedilol (COREG) 25 MG tablet Take 25 mg by mouth 2 (two) times daily with a meal.       . DISCONTD: enalapril (VASOTEC) 20 MG tablet Take 20 mg by mouth daily.       Marland Kitchen DISCONTD: glimepiride (AMARYL) 4 MG tablet Take 4 mg by mouth 2 (two) times daily before a meal.        . DISCONTD: simvastatin (ZOCOR) 40 MG tablet Take 40 mg by mouth daily.          No Known Allergies   Review of Systems (Positive items checked otherwise negative)  General: [ ]  Weight loss, [ ]  Weight gain, [ ]   Loss of appetite, [ ]  Fever  Neurologic: [ ]  Dizziness, [ ]  Blackouts, [ ]  Headaches, [ ]  Seizure, [x]  weakness and numbness in legs  Ear/Nose/Throat: [ ]  Change in eyesight, [ ]  Change in hearing, [ ]  Nose bleeds, [ ]  Sore throat  Vascular: [x]  Pain in legs with walking, [ ]  Pain in feet while lying flat, [ ]  Non-healing ulcer, Stroke, [ ]  "Mini stroke", [ ]  Slurred speech, [ ]  Temporary blindness, [ ]  Blood clot in vein, [ ]  Phlebitis  Pulmonary: [ ]  Home oxygen, [ ]  Productive cough, [ ]  Bronchitis, [ ]  Coughing up blood,  [ ]  Asthma, [ ]  Wheezing  Musculoskeletal: [ ]  Arthritis, [ ]  Joint pain, [ ]  Muscle pain  Cardiac: [ ]  Chest pain, [ ]  Chest tightness/pressure, [ ]  Shortness of breath when lying flat, [ ]  Shortness of breath with exertion, [ ]  Palpitations, [ ]  Heart murmur, [ ]  Arrythmia,  [ ]  Atrial fibrillation  Hematologic: [ ]  Bleeding problems, [ ]  Clotting disorder, [ ]  Anemia  Psychiatric:  [ ]  Depression, [ ]  Anxiety, [ ]  Attention deficit disorder  Gastrointestinal:  [ ]  Black stool,[ ]   Blood in stool, [ ]  Peptic ulcer disease, [ ]  Reflux, [ ]  Hiatal hernia, [ ]  Trouble  swallowing, [ ]  Diarrhea, [ ]  Constipation  Urinary:  [ ]  Kidney disease, [ ]  Burning with urination,  [ ]  Frequent urination, [ ]  Difficulty urinating  Skin: [ ]  Ulcers, [ ]  Rashes   Physical Examination  Filed Vitals:   10/19/11 1020 10/19/11 1024  BP: 150/64 152/63  Pulse: 71 73  Temp: 97.8 F (36.6 C)   TempSrc: Oral   Height: 5\' 11"  (1.803 m)   Weight: 154 lb (69.854 kg)    Body mass index is 21.48 kg/(m^2).  General: A&O x 3, WDWN, Cachectic   Head: [place]/AT  Ear/Nose/Throat: Hearing grossly intact with hearing aids, nares w/o erythema or drainage, oropharynx w/o Erythema/Exudate  Eyes: PERRLA, EOMI  Neck: Supple, no nuchal rigidity, no palpable LAD  Pulmonary: Sym exp, good air movt, CTAB, no rales, rhonchi, & wheezing  Cardiac: RRR, Nl S1, S2, no Murmurs, rubs or gallops  Vascular: Vessel Right Left  Radial Palpable Palpable  Brachial Palpable Palpable  Carotid Palpable, without bruit Palpable, without bruit  Aorta Non-palpable [name]  Femoral Palpable Palpable  Popliteal Non-palpable Non-palpable  PT Non-Palpable Non-Palpable  DP Non-Palpable Non-Palpable   Gastrointestinal: soft, NTND, -G/R, - HSM, - masses, - CVAT B  Musculoskeletal: M/S 5/5 throughout , Extremities without ischemic changes , mild amt of dependent rubor in right foot, healed L 1st MT ulcer  Neurologic: CN 2-12 intact , Pain and light touch intact in extremities , Motor exam as listed above  Psychiatric: Judgment intact, Mood & affect appropriatefor pt's clinical situation  Dermatologic: See M/S exam for extremity exam, no rashes otherwise noted  Lymph : No Cervical, Axillary, or Inguinal lymphadenopathy   Non-Invasive Vascular Imaging  B carotid duplex (Date: 10/19/11)  R ICA: 40-59%  L ICA: 40-59%  Heavy calcification in both carotids may obscure higher velocities  B VA antegrade and patent  Aortogram, B leg runoff (08/23/11)  I reviewed the patient's aortogram and leg runoff, this patient has extensive calcific atherosclerosis at multiple levels with extensive destruction of the  femoropopliteal system bilateral and extensive tibial disease.  I don't think the right leg has a good revascularization target.  The left profunda artery has distal disease past the first few bifurcations.  Without landmarks, I can't be certain.  There may be a BK popliteal or PT bypass target on this side.  Medical Decision Making  Winner Valeriano is a 78 y.o. male who presents with: very short distance claudication at risk for conversion to CLI if his left PFA occludes, B ICA stenoses   Given the concerns with the left profunda artery, I would treat this patient like a CLI patient.  I discussed with the patient the natural history of critical limb ischemia: 25% require amputation in one year, 50% are able to maintain their limbs in one year, and 25-30% die in one year due to comorbidities.  Given the limb threatening status of this patient, I recommend an aggressive work up including proceeding with an: Left leg angiogram, possible orbital atherectomy with angioplasty Given the extensive calcific disease, there is a real risk that a surgical endarterectomy may fail in this patient so I think an non-surgical attempt is merited.  As an open surgical procedure which likely involve: left extended profundaplasty, femoral to popliteal vs tibial bypass. I discussed with the patient the nature of angiographic procedures, especially the limited patencies of any endovascular intervention. The patient is aware of that the risks of an angiographic procedure include but are not limited to: bleeding,  infection, access site complications, embolization, rupture of treated vessel, dissection, possible need for emergent surgical intervention, and possible need for surgical procedures to treat the patient's pathology. The patient is aware of the risks and agrees to proceed.  The procedure is scheduled for: 9 MAY 13. In regards to the B ICA stenoses, I don't think he needs immediately any intervention.  His carotid  studies were mistakenly ordered on this patient as the refer was for "endarterectomy" and staff interpreted this as a carotid endarterectomy.  I discussed in depth with the patient the nature of atherosclerosis, and emphasized the importance of maximal medical management including strict control of blood pressure, blood glucose, and lipid levels, antiplatelet agents, obtaining regular exercise, and cessation of smoking.  The patient is aware that without maximal medical management the underlying atherosclerotic disease process will progress, limiting the benefit of any interventions.  Thank you for allowing [place] to participate in this patient's care.  Leonides Sake, MD [facility] [facility] Office: [phone] Pager: [phone]  10/19/2011, 11:06 AM

## 2011-10-22 ENCOUNTER — Other Ambulatory Visit: Payer: Self-pay

## 2011-10-23 NOTE — Procedures (Unsigned)
CAROTID DUPLEX EXAM  INDICATION:  Followup carotid artery disease  HISTORY: Diabetes:  Yes Cardiac:  CABG x6 06/28/1995 Hypertension:  Yes Smoking:  No Previous Surgery:  PTA and stenting renal arteries bilaterally. Occluded SFA bilaterally.  Larynx removed CV History:  Asymptomatic Amaurosis Fugax No, Paresthesias No, Hemiparesis No                                      RIGHT             LEFT Brachial systolic pressure:         140               144 Brachial Doppler waveforms:         Biphasic          Biphasic Vertebral direction of flow:        Antegrade         Antegrade DUPLEX VELOCITIES (cm/sec) CCA peak systolic                   82                101 ECA peak systolic                   191               238 ICA peak systolic                   148               189 ICA end diastolic                   30                32 PLAQUE MORPHOLOGY:                  Calcific          Calcific PLAQUE AMOUNT:                      Moderate to severe                  Severe PLAQUE LOCATION:                    CCA, bifurcation, ICA and ECA       CCA, bifurcation, ICA and ECA  IMPRESSION: 1. 40%-59% right internal carotid artery stenosis. 2. 40%-59% left internal carotid artery stenosis probable end of     range, unable to obtain high velocities as obtained on prior     studies due to heavy plaque. 3. Left external carotid artery stenosis. 4. Note:  Calcific plaque may obscure higher velocities.  ___________________________________________ Fransisco Hertz, MD  SS/MEDQ  D:  10/19/2011  T:  10/19/2011  Job:  [id number]

## 2011-10-25 ENCOUNTER — Ambulatory Visit (HOSPITAL_COMMUNITY)
Admission: RE | Admit: 2011-10-25 | Discharge: 2011-10-25 | Disposition: A | Discharge status: Home / Self Care | Payer: Medicare Other | Source: Ambulatory Visit | Attending: Vascular Surgery | Admitting: Vascular Surgery

## 2011-10-25 ENCOUNTER — Telehealth: Payer: Self-pay | Admitting: Vascular Surgery

## 2011-10-25 ENCOUNTER — Encounter (HOSPITAL_COMMUNITY)
Admission: RE | Disposition: A | Discharge status: Home / Self Care | Payer: Self-pay | Source: Ambulatory Visit | Attending: Vascular Surgery

## 2011-10-25 DIAGNOSIS — I2510 Atherosclerotic heart disease of native coronary artery without angina pectoris: Secondary | ICD-10-CM | POA: Insufficient documentation

## 2011-10-25 DIAGNOSIS — I70219 Atherosclerosis of native arteries of extremities with intermittent claudication, unspecified extremity: Secondary | ICD-10-CM

## 2011-10-25 DIAGNOSIS — E119 Type 2 diabetes mellitus without complications: Secondary | ICD-10-CM | POA: Insufficient documentation

## 2011-10-25 DIAGNOSIS — E785 Hyperlipidemia, unspecified: Secondary | ICD-10-CM | POA: Insufficient documentation

## 2011-10-25 HISTORY — PX: LOWER EXTREMITY ANGIOGRAM: SHX5508

## 2011-10-25 LAB — GLUCOSE, CAPILLARY: Glucose-Capillary: 136 mg/dL — ABNORMAL HIGH (ref 70–99)

## 2011-10-25 SURGERY — ANGIOGRAM, LOWER EXTREMITY
Anesthesia: LOCAL

## 2011-10-25 MED ORDER — ONDANSETRON HCL 4 MG/2ML IJ SOLN: 4.0000 mg | Freq: Four times a day (QID) | INTRAMUSCULAR | Status: DC | PRN

## 2011-10-25 MED ORDER — ACETAMINOPHEN 325 MG PO TABS: 650.0000 mg | ORAL_TABLET | ORAL | Status: DC | PRN

## 2011-10-25 MED ORDER — SODIUM CHLORIDE 0.9 % IV SOLN
INTRAVENOUS | Status: DC
  Administered 2011-10-25: 11:00:00 via INTRAVENOUS

## 2011-10-25 MED ORDER — HEPARIN (PORCINE) IN NACL 2-0.9 UNIT/ML-% IJ SOLN
INTRAMUSCULAR | Status: AC
  Filled 2011-10-25: qty 1000

## 2011-10-25 MED ORDER — MIDAZOLAM HCL 2 MG/2ML IJ SOLN
INTRAMUSCULAR | Status: AC
  Filled 2011-10-25: qty 2

## 2011-10-25 MED ORDER — MORPHINE SULFATE 4 MG/ML IJ SOLN: 2.0000 mg | INTRAMUSCULAR | Status: DC | PRN

## 2011-10-25 MED ORDER — OXYCODONE-ACETAMINOPHEN 5-325 MG PO TABS: 1.0000 | ORAL_TABLET | ORAL | Status: DC | PRN

## 2011-10-25 MED ORDER — SODIUM CHLORIDE 0.9 % IV SOLN
INTRAVENOUS | Status: DC
  Administered 2011-10-25: 06:00:00 via INTRAVENOUS

## 2011-10-25 MED ORDER — FENTANYL CITRATE 0.05 MG/ML IJ SOLN
INTRAMUSCULAR | Status: AC
  Filled 2011-10-25: qty 2

## 2011-10-25 MED ORDER — LIDOCAINE HCL (PF) 1 % IJ SOLN
INTRAMUSCULAR | Status: AC
  Filled 2011-10-25: qty 30

## 2011-10-25 NOTE — Discharge Instructions (Signed)

## 2011-10-25 NOTE — H&P (View-Only) (Signed)
[facility] [place]  Referred by:  Jayadeep S. Varanasi, MD [address] [place], [place] [address]  Reason for referral: Left profunda stenosis  History of Present Illness  [name] is a 78 y.o. (05/08/1923) male who presents with chief complaint: bilateral leg pain.  Onset of symptom occurred unknown exact chronology but at least 3 months prior.  Pain is described as cramping calf and foot pain, severity 3-6/10, and associated with ambulation.  Patient has attempted to treat this pain with rest, medication and walking pain.  The patient can walk about 7 minutes before having symptoms.  The patient has not have rest pain symptoms also and previously had a left foot ulcer, which has healed at this point.  At this point, he feels his right foot sx are worsen than his left.  Atherosclerotic risk factors include: diabetes, hyperlipidemia, and distal smoking history.  Past Medical History  Diagnosis Date  . Coronary artery disease     s/p CABG 1996 then PCI of SVG to OM 2007  . Diverticulosis   . Dyslipidemia   . Renal artery stenosis     s/p bilateral stents  . Hiatal hernia   . Colon polyps   . Carotid artery stenosis   . Peripheral neuropathy   . Anemia   . Hypothyroidism   . Nasal polyps   . Hypertension   . Diabetes mellitus   . Throat cancer   . Myocardial infarction   . Peripheral vascular disease     Past Surgical History  Procedure Date  . Coronary artery bypass graft     1996  . Cardiac catheterization     PCI of SVG to OM 2007  . Renal artery stent   . Tracheostomy   . Appendectomy     History   Social History  . Marital Status: Married    Spouse Name: [name]    Number of Children: [name]  . Years of Education: [name]   Occupational History  . Not on file.   Social History Main Topics  . Smoking status: Former Smoker -- 0.3 packs/day    Types: Cigarettes    Quit date: 06/18/1946  . Smokeless tobacco: Not on file  .  Alcohol Use: No  . Drug Use: No  . Sexually Active: Not on file   Other Topics Concern  . Not on file   Social History Narrative   ** Merged History Encounter **     Family History  Problem Relation Age of Onset  . Hypertension Mother   . Heart attack Brother   . Diabetes Son     Current Outpatient Prescriptions on File Prior to Visit  Medication Sig Dispense Refill  . acarbose (PRECOSE) 100 MG tablet Take 100 mg by mouth 3 (three) times daily with meals.       . albuterol (PROVENTIL HFA;VENTOLIN HFA) 108 (90 BASE) MCG/ACT inhaler Inhale 2 puffs into the lungs at bedtime.       . amLODipine (NORVASC) 10 MG tablet Take 10 mg by mouth. Pt states he does not know the frequency      . aspirin 81 MG chewable tablet Chew 81 mg by mouth daily.       . atorvastatin (LIPITOR) 10 MG tablet Take 10 mg by mouth daily.      . clopidogrel (PLAVIX) 75 MG tablet Take 75 mg by mouth daily.       . doxycycline (VIBRA-TABS) 100 MG tablet Take 100   mg by mouth 2 (two) times daily.      . finasteride (PROSCAR) 5 MG tablet Take 5 mg by mouth daily.       . fluticasone (FLONASE) 50 MCG/ACT nasal spray Place 1 spray into the nose daily.       . glipiZIDE (GLUCOTROL) 10 MG tablet Take 10 mg by mouth 2 (two) times daily before a meal.       . HYDROcodone-acetaminophen (VICODIN) 5-500 MG per tablet Take 1 tablet by mouth every 6 (six) hours as needed. For pain      . levothyroxine (SYNTHROID, LEVOTHROID) 50 MCG tablet Take 50 mcg by mouth daily before breakfast.       . levothyroxine (SYNTHROID, LEVOTHROID) 50 MCG tablet Take 50 mcg by mouth daily.        . lisinopril-hydrochlorothiazide (PRINZIDE,ZESTORETIC) 20-12.5 MG per tablet Take 2 tablets by mouth daily.       . metFORMIN (GLUCOPHAGE) 500 MG tablet Take 2 tablets (1,000 mg total) by mouth 2 (two) times daily with a meal.      . mirtazapine (REMERON) 15 MG tablet Take 7.5 mg by mouth at bedtime.      . Multiple Vitamin (MULITIVITAMIN WITH MINERALS)  TABS Take 1 tablet by mouth daily.      . sertraline (ZOLOFT) 25 MG tablet Take 25 mg by mouth daily. Takes with 50mg      . sertraline (ZOLOFT) 50 MG tablet Take 50 mg by mouth daily. Takes with 25mg      . DISCONTD: carvedilol (COREG) 25 MG tablet Take 25 mg by mouth 2 (two) times daily with a meal.       . DISCONTD: enalapril (VASOTEC) 20 MG tablet Take 20 mg by mouth daily.       . DISCONTD: glimepiride (AMARYL) 4 MG tablet Take 4 mg by mouth 2 (two) times daily before a meal.        . DISCONTD: simvastatin (ZOCOR) 40 MG tablet Take 40 mg by mouth daily.          No Known Allergies   Review of Systems (Positive items checked otherwise negative)  General: [ ] Weight loss, [ ] Weight gain, [ ]  Loss of appetite, [ ] Fever  Neurologic: [ ] Dizziness, [ ] Blackouts, [ ] Headaches, [ ] Seizure, [x] weakness and numbness in legs  Ear/Nose/Throat: [ ] Change in eyesight, [ ] Change in hearing, [ ] Nose bleeds, [ ] Sore throat  Vascular: [x] Pain in legs with walking, [ ] Pain in feet while lying flat, [ ] Non-healing ulcer, Stroke, [ ] "Mini stroke", [ ] Slurred speech, [ ] Temporary blindness, [ ] Blood clot in vein, [ ] Phlebitis  Pulmonary: [ ] Home oxygen, [ ] Productive cough, [ ] Bronchitis, [ ] Coughing up blood,  [ ] Asthma, [ ] Wheezing  Musculoskeletal: [ ] Arthritis, [ ] Joint pain, [ ] Muscle pain  Cardiac: [ ] Chest pain, [ ] Chest tightness/pressure, [ ] Shortness of breath when lying flat, [ ] Shortness of breath with exertion, [ ] Palpitations, [ ] Heart murmur, [ ] Arrythmia,  [ ] Atrial fibrillation  Hematologic: [ ] Bleeding problems, [ ] Clotting disorder, [ ] Anemia  Psychiatric:  [ ] Depression, [ ] Anxiety, [ ] Attention deficit disorder  Gastrointestinal:  [ ] Black stool,[ ]  Blood in stool, [ ] Peptic ulcer disease, [ ] Reflux, [ ] Hiatal hernia, [ ] Trouble   swallowing, [ ] Diarrhea, [ ] Constipation  Urinary:  [ ] Kidney disease, [ ] Burning with urination,  [ ] Frequent urination, [ ] Difficulty urinating  Skin: [ ] Ulcers, [ ] Rashes   Physical Examination  Filed Vitals:   10/19/11 1020 10/19/11 1024  BP: 150/64 152/63  Pulse: 71 73  Temp: 97.8 F (36.6 C)   TempSrc: Oral   Height: 5' 11" (1.803 m)   Weight: 154 lb (69.854 kg)    Body mass index is 21.48 kg/(m^2).  General: A&O x 3, WDWN, Cachectic   Head: [place]/AT  Ear/Nose/Throat: Hearing grossly intact with hearing aids, nares w/o erythema or drainage, oropharynx w/o Erythema/Exudate  Eyes: PERRLA, EOMI  Neck: Supple, no nuchal rigidity, no palpable LAD  Pulmonary: Sym exp, good air movt, CTAB, no rales, rhonchi, & wheezing  Cardiac: RRR, Nl S1, S2, no Murmurs, rubs or gallops  Vascular: Vessel Right Left  Radial Palpable Palpable  Brachial Palpable Palpable  Carotid Palpable, without bruit Palpable, without bruit  Aorta Non-palpable [name]  Femoral Palpable Palpable  Popliteal Non-palpable Non-palpable  PT Non-Palpable Non-Palpable  DP Non-Palpable Non-Palpable   Gastrointestinal: soft, NTND, -G/R, - HSM, - masses, - CVAT B  Musculoskeletal: M/S 5/5 throughout , Extremities without ischemic changes , mild amt of dependent rubor in right foot, healed L 1st MT ulcer  Neurologic: CN 2-12 intact , Pain and light touch intact in extremities , Motor exam as listed above  Psychiatric: Judgment intact, Mood & affect appropriatefor pt's clinical situation  Dermatologic: See M/S exam for extremity exam, no rashes otherwise noted  Lymph : No Cervical, Axillary, or Inguinal lymphadenopathy   Non-Invasive Vascular Imaging  B carotid duplex (Date: 10/19/11)  R ICA: 40-59%  L ICA: 40-59%  Heavy calcification in both carotids may obscure higher velocities  B VA antegrade and patent  Aortogram, B leg runoff (08/23/11)  I reviewed the patient's aortogram and leg runoff, this patient has extensive calcific atherosclerosis at multiple levels with extensive destruction of the  femoropopliteal system bilateral and extensive tibial disease.  I don't think the right leg has a good revascularization target.  The left profunda artery has distal disease past the first few bifurcations.  Without landmarks, I can't be certain.  There may be a BK popliteal or PT bypass target on this side.  Medical Decision Making  Shogo Recht is a 78 y.o. male who presents with: very short distance claudication at risk for conversion to CLI if his left PFA occludes, B ICA stenoses   Given the concerns with the left profunda artery, I would treat this patient like a CLI patient.  I discussed with the patient the natural history of critical limb ischemia: 25% require amputation in one year, 50% are able to maintain their limbs in one year, and 25-30% die in one year due to comorbidities.  Given the limb threatening status of this patient, I recommend an aggressive work up including proceeding with an: Left leg angiogram, possible orbital atherectomy with angioplasty Given the extensive calcific disease, there is a real risk that a surgical endarterectomy may fail in this patient so I think an non-surgical attempt is merited.  As an open surgical procedure which likely involve: left extended profundaplasty, femoral to popliteal vs tibial bypass. I discussed with the patient the nature of angiographic procedures, especially the limited patencies of any endovascular intervention. The patient is aware of that the risks of an angiographic procedure include but are not limited to: bleeding,   infection, access site complications, embolization, rupture of treated vessel, dissection, possible need for emergent surgical intervention, and possible need for surgical procedures to treat the patient's pathology. The patient is aware of the risks and agrees to proceed.  The procedure is scheduled for: 9 MAY 13. In regards to the B ICA stenoses, I don't think he needs immediately any intervention.  His carotid  studies were mistakenly ordered on this patient as the refer was for "endarterectomy" and staff interpreted this as a carotid endarterectomy.  I discussed in depth with the patient the nature of atherosclerosis, and emphasized the importance of maximal medical management including strict control of blood pressure, blood glucose, and lipid levels, antiplatelet agents, obtaining regular exercise, and cessation of smoking.  The patient is aware that without maximal medical management the underlying atherosclerotic disease process will progress, limiting the benefit of any interventions.  Thank you for allowing us to participate in this patient's care.  Lidie Glade, MD [facility] Office: [phone] Pager: [phone]  10/19/2011, 11:06 AM     

## 2011-10-25 NOTE — Telephone Encounter (Addendum)
Message copied by Rosalyn Charters on Thu Oct 25, 2011  3:17 PM ------      Message from: Melene Plan      Created: Thu Oct 25, 2011 11:49 AM                   ----- Message -----         From: Fransisco Hertz, MD         Sent: 10/25/2011   9:16 AM           To: Almetta Lovely, RN            Junaid Wurzer      [id number]      07-23-1922            PROCEDURE:      1.  Right common femoral artery cannulation under ultrasound guidance      2.  Second order arterial selection      3.  Left leg angiogram            Follow-up: 4 weeks  Notified patient of follow-up visit on 11-30-11 11:45am

## 2011-10-25 NOTE — Op Note (Signed)
OPERATIVE NOTE   PROCEDURE: 1.  Right common femoral artery cannulation under ultrasound guidance 2.  Second order arterial selection 3.  Left leg angiogram  PRE-OPERATIVE DIAGNOSIS: Left leg claudication  POST-OPERATIVE DIAGNOSIS: same as above   SURGEON: Leonides Sake, MD  ANESTHESIA: conscious sedation  ESTIMATED BLOOD LOSS: 30 cc  CONTRAST: 100 cc  FINDING(S):  Patent left external iliac artery and common femoral artery  Occluded left superficial femoral artery   Patent left profunda femoral artery with one first order branch with extensive calcific atherosclerotic stenosis and other branch patent  Reconstitution of above the knee popliteal artery with 90% stenosis of the popliteal artery at the level of the knee joint  Patent below the knee popliteal artery continuing as posterior tibial artery.  Only runoff in posterior tibial artery.  Occluded anterior tibial artery and peroneal artery  SPECIMEN(S):  none  INDICATIONS:   [name] is a 78 y.o. male who presents with left leg claudication and known left profunda stenosis.  The patient presents for: left leg angiogram, possible intervention with orbital atherectomy and angioplasty.  I discussed with the patient the nature of angiographic procedures, especially the limited patencies of any endovascular intervention.  The patient is aware of that the risks of an angiographic procedure include but are not limited to: bleeding, infection, access site complications, renal failure, embolization, rupture of vessel, dissection, possible need for emergent surgical intervention, possible need for surgical procedures to treat the patient's pathology, and stroke and death.  The patient is aware of the risks and agrees to proceed.  DESCRIPTION: After full informed consent was obtained from the patient, the patient was brought back to the angiography suite.  The patient was placed supine upon the angiography table and connected to  monitoring equipment.  The patient was then given conscious sedation, the amounts of which are documented in the patient's chart.  The patient was prepped and drape in the standard fashion for an angiographic procedure.  At this point, attention was turned to the right groin.  Under ultrasound guidance, the right common femoral artery was cannulated with a micropuncture needle.  The microwire was advanced into the iliac arterial system.  The needle was exchanged for a microsheath, which was loaded into the common femoral artery over the wire.  The microwire was exchanged for a [facility] wire which was advanced into the aorta.  The microsheath was then exchanged for a 5-Fr sheath which was loaded into the common femoral artery.  The [facility] wire was replaced in the catheter, and using the Dyer and Omniflush catheter, the left common iliac artery was selected.  The wire was exchanged for a glidewire which was placed into the common femoral artery.  The catheter was then exchanged for a end-hole catheter, which was parked in the external iliac artery.  The catheter was connected to the power injector circuit.  After de-airring and de-clotting the circuit, an automated left leg runoff was completed.  Due to patient motion, the tibial images were degraded so a dedicated trifurcation injection was completed.  A distal leg/foot injection was then completed.  I then tried to cross the stenosis in the profunda artery branch artery, despite multiple attempts this was not successful.  At there is evidence of intact blood flow from the other profunda artery branch and extensive collateralization, I don't immediate intervention is necessary.  In this elderly patient with multiple comorbidities and intermittent claudication, I don't think surgical reconstruction is immediately necessary.  Ideally, a left  femoral to posterior tibial bypass would be the best revascularization option.  Unfortunately, the posterior tibial has  significant disease also and this patient's greater saphenous vein already harvested in this left leg.  The plan is to pull the sheath in the holding area.  COMPLICATIONS: none  CONDITION: stable   Leonides Sake, MD [facility] Office: [phone] Pager: [phone]  10/25/2011, 8:02 AM

## 2011-10-25 NOTE — Progress Notes (Signed)
Discharged home; right groin stable; no c/o; instructed to follow up with Dr. Imogene Burn 4 weeks

## 2011-10-25 NOTE — Interval H&P Note (Signed)
[facility]  History and Physical Update  The patient was interviewed and re-examined.  The patient's previous History and Physical has been reviewed and is unchanged.  There is no change in the plan of care.  Leonides Sake, MD [facility] Office: [phone] Pager: [phone]  10/25/2011, 7:17 AM

## 2011-10-26 ENCOUNTER — Telehealth: Payer: Self-pay | Admitting: Vascular Surgery

## 2011-10-26 LAB — POCT I-STAT, CHEM 8
Chloride: 101 mEq/L (ref 96–112)
Glucose, Bld: 143 mg/dL — ABNORMAL HIGH (ref 70–99)
HCT: 36 % — ABNORMAL LOW (ref 39.0–52.0)
Hemoglobin: 12.2 g/dL — ABNORMAL LOW (ref 13.0–17.0)
Potassium: 3.9 mEq/L (ref 3.5–5.1)
Sodium: 137 mEq/L (ref 135–145)

## 2011-10-29 LAB — GLUCOSE, CAPILLARY: Glucose-Capillary: 141 mg/dL — ABNORMAL HIGH (ref 70–99)

## 2011-11-29 ENCOUNTER — Encounter: Payer: Self-pay | Admitting: Vascular Surgery

## 2011-11-30 ENCOUNTER — Ambulatory Visit: Payer: Medicare Other | Admitting: Vascular Surgery

## 2011-11-30 ENCOUNTER — Ambulatory Visit: Payer: Self-pay | Admitting: Vascular Surgery

## 2012-02-15 ENCOUNTER — Other Ambulatory Visit: Payer: Self-pay | Admitting: *Deleted

## 2012-02-15 ENCOUNTER — Telehealth: Payer: Self-pay | Admitting: Vascular Surgery

## 2012-02-15 DIAGNOSIS — Z01810 Encounter for preprocedural cardiovascular examination: Secondary | ICD-10-CM

## 2012-02-15 DIAGNOSIS — I739 Peripheral vascular disease, unspecified: Secondary | ICD-10-CM

## 2012-02-15 NOTE — Telephone Encounter (Signed)
Message copied by Fredrich Birks on Fri Feb 15, 2012  3:48 PM ------      Message from: Melene Plan      Created: Fri Feb 15, 2012  3:19 PM                   ----- Message -----         From: Fransisco Hertz, MD         Sent: 02/15/2012   2:30 PM           To: Melene Plan, RN            [name]      [id number]      December 18, 1922            Patient was supposed to follow up in the office after his angiogram in May.  He need follow appointment in the next two weeks.            - will need also BLE GSV vein mapping if not done recently.

## 2012-02-15 NOTE — Telephone Encounter (Signed)
LVM for pt, dpm °

## 2012-02-28 ENCOUNTER — Encounter: Payer: Self-pay | Admitting: Vascular Surgery

## 2012-02-29 ENCOUNTER — Ambulatory Visit: Payer: Medicare Other | Admitting: Vascular Surgery

## 2012-03-05 ENCOUNTER — Telehealth: Payer: Self-pay | Admitting: Vascular Surgery

## 2012-03-05 NOTE — Telephone Encounter (Signed)
[name] came into our office this morning with a no show letter from [facility]. He had missed an appt on 02/29/12 for bilateral vein mapping and an office visit with Dr.Chen. He stated he wasn't interested in rescheduling the appts at this time. And also stated his pcp was following him for this problem. I asked him to call and reschedule if he decided to continue his care with our office. [name]

## 2012-08-01 ENCOUNTER — Emergency Department (INDEPENDENT_AMBULATORY_CARE_PROVIDER_SITE_OTHER)
Admission: EM | Admit: 2012-08-01 | Discharge: 2012-08-01 | Disposition: A | Discharge status: Home / Self Care | Payer: Medicare Other | Source: Home / Self Care | Attending: Family Medicine | Admitting: Family Medicine

## 2012-08-01 ENCOUNTER — Emergency Department (INDEPENDENT_AMBULATORY_CARE_PROVIDER_SITE_OTHER): Payer: Medicare Other

## 2012-08-01 ENCOUNTER — Encounter (HOSPITAL_COMMUNITY): Payer: Self-pay | Admitting: Emergency Medicine

## 2012-08-01 DIAGNOSIS — E119 Type 2 diabetes mellitus without complications: Secondary | ICD-10-CM

## 2012-08-01 DIAGNOSIS — K219 Gastro-esophageal reflux disease without esophagitis: Secondary | ICD-10-CM

## 2012-08-01 DIAGNOSIS — J40 Bronchitis, not specified as acute or chronic: Secondary | ICD-10-CM

## 2012-08-01 LAB — POCT RAPID STREP A: Streptococcus, Group A Screen (Direct): NEGATIVE

## 2012-08-01 LAB — GLUCOSE, CAPILLARY: Glucose-Capillary: 179 mg/dL — ABNORMAL HIGH (ref 70–99)

## 2012-08-01 MED ORDER — RANITIDINE HCL 150 MG PO CAPS: 150.0000 mg | ORAL_CAPSULE | Freq: Every day | ORAL | Status: DC

## 2012-08-01 MED ORDER — AMOXICILLIN 500 MG PO CAPS: 500.0000 mg | ORAL_CAPSULE | Freq: Two times a day (BID) | ORAL | Status: DC

## 2012-08-01 MED ORDER — BENZONATATE 100 MG PO CAPS: 100.0000 mg | ORAL_CAPSULE | Freq: Three times a day (TID) | ORAL | Status: DC | PRN

## 2012-08-01 NOTE — ED Notes (Signed)
Pt is here for hyperglycemia concerns Reports sugars yest am before breakfast were 196 and this am before breakfast were 281 Dr's office closed Took his Metformin and glipizide today Denies any medical problems  He is alert and responsive w/no signs of acute distress.

## 2012-08-01 NOTE — ED Provider Notes (Signed)
History     CSN: [id number]  Arrival date & time 08/01/12  1059   First MD Initiated Contact with Patient 08/01/12 1137      Chief Complaint  Patient presents with  . Hyperglycemia    (Consider location/radiation/quality/duration/timing/severity/associated sxs/prior treatment) HPI Comments: 79 year old male with history of coronary and peripheral artery disease, hypertension and diabetes among multiple other comorbidities. Patient also has a remote history of laryngeal cancer and speaks to a tracheostomy. Patient is on chronic anticoagulation with Plavix. Patient is here concerned about he is blood sugar been "high" when he checks before breakfast in the morning. Patient states that his glucometer is showing values in the 190s and this morning was 281 before breakfast. His primary care provider is Dr. Toy Care and patient takes metformin 2000 mg daily and glipizide 10 mg twice a day. Patient reports he's consistent with his medications. He also admits he might have some diet transgressions  but he usually tries to be careful with his diet. Patient denies headache or dizziness. No abdominal pain, nausea vomiting or diarrhea. His appetite is good and he had breakfast this morning one slice of bread and a waffle with sugar-free syrup. He also had 2 slices of bacon and grits. He postprandial CBG is 179 here. Patient also reports his glucometer is very old he had it for at least 40 years! Patient denies dental pain. No skin infections. No dysuria. No fever or chills.  Patient reports he had intermittent coughing for while and in the last few days have seen some mucous tinged with blood. He also reports acid reflux and throat irritation. Patient does not take any medications for acid reflux. Patient denies chest pain or shortness of breath. No leg swelling or PND. No difficulty breathing.    Past Medical History  Diagnosis Date  . Coronary artery disease     s/p CABG 1996 then PCI of SVG to OM 2007   . Diverticulosis   . Dyslipidemia   . Renal artery stenosis     s/p bilateral stents  . Hiatal hernia   . Colon polyps   . Carotid artery stenosis   . Peripheral neuropathy   . Anemia   . Hypothyroidism   . Nasal polyps   . Hypertension   . Diabetes mellitus   . Throat cancer   . Myocardial infarction   . Peripheral vascular disease     Past Surgical History  Procedure Laterality Date  . Coronary artery bypass graft      1996  . Cardiac catheterization      PCI of SVG to OM 2007  . Renal artery stent    . Tracheostomy    . Appendectomy      Family History  Problem Relation Age of Onset  . Hypertension Mother   . Heart attack Brother   . Diabetes Son     History  Substance Use Topics  . Smoking status: Former Smoker -- 0.30 packs/day    Types: Cigarettes    Quit date: 06/18/1946  . Smokeless tobacco: Not on file  . Alcohol Use: No      Review of Systems  Constitutional: Negative for fever, chills, diaphoresis, activity change, appetite change, fatigue and unexpected weight change.  HENT: Negative for ear pain, congestion and trouble swallowing.   Respiratory: Positive for cough. Negative for shortness of breath and wheezing.   Gastrointestinal: Negative for nausea, vomiting, abdominal pain, diarrhea and constipation.  Skin: Negative for rash and wound.  No skin ulcers or infections.   Neurological: Negative for dizziness and headaches.  All other systems reviewed and are negative.    Allergies  Review of patient's allergies indicates no known allergies.  Home Medications   Current Outpatient Rx  Name  Route  Sig  Dispense  Refill  . amLODipine (NORVASC) 10 MG tablet   Oral   Take 10 mg by mouth. Pt states he does not know the frequency         . atorvastatin (LIPITOR) 10 MG tablet   Oral   Take 10 mg by mouth daily.         . clopidogrel (PLAVIX) 75 MG tablet   Oral   Take 75 mg by mouth daily.          . finasteride (PROSCAR)  5 MG tablet   Oral   Take 5 mg by mouth daily.          Marland Kitchen glipiZIDE (GLUCOTROL) 10 MG tablet   Oral   Take 10 mg by mouth 2 (two) times daily before a meal.          . levothyroxine (SYNTHROID, LEVOTHROID) 50 MCG tablet   Oral   Take 50 mcg by mouth daily before breakfast.          . lisinopril-hydrochlorothiazide (PRINZIDE,ZESTORETIC) 20-12.5 MG per tablet   Oral   Take 2 tablets by mouth daily.          . metFORMIN (GLUCOPHAGE) 500 MG tablet   Oral   Take 2 tablets (1,000 mg total) by mouth 2 (two) times daily with a meal.         . acarbose (PRECOSE) 100 MG tablet   Oral   Take 100 mg by mouth 3 (three) times daily with meals.          Marland Kitchen albuterol (PROVENTIL HFA;VENTOLIN HFA) 108 (90 BASE) MCG/ACT inhaler   Inhalation   Inhale 2 puffs into the lungs at bedtime.          Marland Kitchen amoxicillin (AMOXIL) 500 MG capsule   Oral   Take 1 capsule (500 mg total) by mouth 2 (two) times daily.   14 capsule   0   . aspirin 81 MG chewable tablet   Oral   Chew 81 mg by mouth daily.          . benzonatate (TESSALON) 100 MG capsule   Oral   Take 1 capsule (100 mg total) by mouth 3 (three) times daily as needed for cough.   21 capsule   0   . fluticasone (FLONASE) 50 MCG/ACT nasal spray   Nasal   Place 1 spray into the nose daily.          Marland Kitchen HYDROcodone-acetaminophen (VICODIN) 5-500 MG per tablet   Oral   Take 1 tablet by mouth every 6 (six) hours as needed. For pain         . levothyroxine (SYNTHROID, LEVOTHROID) 50 MCG tablet   Oral   Take 50 mcg by mouth daily.           . mirtazapine (REMERON) 15 MG tablet   Oral   Take 7.5 mg by mouth at bedtime.         . Multiple Vitamin (MULITIVITAMIN WITH MINERALS) TABS   Oral   Take 1 tablet by mouth daily.         . polyethylene glycol powder (GLYCOLAX/MIRALAX) powder               .  ranitidine (ZANTAC) 150 MG capsule   Oral   Take 1 capsule (150 mg total) by mouth daily. As needed for acid  reflux   30 capsule   0     BP 94/61  Pulse 83  Temp(Src) 98.6 F (37 C) (Oral)  Resp 16  SpO2 97%  Physical Exam  Vitals reviewed. Constitutional: He is oriented to person, place, and time. He appears well-developed and well-nourished. No distress.  Pleasant cooeprative. Comfortable.  HENT:  Head: Normocephalic and atraumatic.  Right Ear: External ear normal.  Left Ear: External ear normal.  Nasal Congestion with erythema of nasal turbinates, no rhinorrhea. Significant pharyngeal erythema no exudates. No uvula deviation. No trismus. No obvious gum or dental infection process.  TM's normal. Patient is hard of hearing. Patient is well versed pressing with his hand his tracheostomy to speak.   Neck: No JVD present.  No signs of infection around tracheostomy. left side carotid bruit.  Cardiovascular: Normal rate, regular rhythm and normal heart sounds.  Exam reveals no gallop and no friction rub.   Pulmonary/Chest: Effort normal and breath sounds normal. No respiratory distress. He has no wheezes. He has no rales. He exhibits no tenderness.  Abdominal: Soft. There is no tenderness.  Lymphadenopathy:    He has no cervical adenopathy.  Neurological: He is alert and oriented to person, place, and time.  Skin: No rash noted. He is not diaphoretic.  Feet with no pressure sore or signs of infection.     ED Course  Procedures (including critical care time)  Labs Reviewed  GLUCOSE, CAPILLARY - Abnormal; Notable for the following:    Glucose-Capillary 179 (*)    All other components within normal limits  POCT RAPID STREP A ([facility] CARE ONLY)   Dg Chest 2 View  08/01/2012  *RADIOLOGY REPORT*  Clinical Data: Cough  CHEST - 2 VIEW  Comparison: 03/17/2012  Findings: Mildly prominent reticular markings are noted predominately in the upper lobes bilaterally.  Heart size is normal with evidence of CABG.  No focal nodule or mass.  No pleural effusion.  No acute osseous finding.   Moderate atheromatous calcification of the aorta.  IMPRESSION: Mildly prominent predominately biapical reticular markings which could indicate bronchitis or diffuse infection/inflammatory change.   Original Report Authenticated By: Christiana Pellant, M.D.      1. Bronchitis   2. GERD (gastroesophageal reflux disease)   3. Diabetes       MDM  Patient looks clinically well. He is exam is reassuring including lung exam. My impression is that cough and blood-tinged sputum might be related to a early bronchitis and acid reflux irritating his throat likely contributing. Impress normal postprandial CBG here.  I recommended patient to continue diabetes medications as previously prescribed and adhere to strict diabetes diet until follow up with his primary care provider next week. He also was instructed to get a new glucometer his current glucometer is at least 77 years old! and need to start a new log with fasting CBG values to take to her next doctor's appointment. Prescribed amoxicillin, ranitidine and Tessalon Perles. Supportive care and red flags that should prompt his return to medical attention discussed with patient and provided in writing.    Sharin Grave, MD 08/02/12 1610

## 2012-11-06 ENCOUNTER — Inpatient Hospital Stay (HOSPITAL_COMMUNITY)
Admission: AD | Admit: 2012-11-06 | Discharge: 2012-11-08 | DRG: 247 | Disposition: A | Discharge status: Home / Self Care | Payer: Medicare Other | Source: Ambulatory Visit | Attending: Interventional Cardiology | Admitting: Interventional Cardiology

## 2012-11-06 ENCOUNTER — Encounter (HOSPITAL_COMMUNITY): Payer: Self-pay | Admitting: Interventional Cardiology

## 2012-11-06 ENCOUNTER — Other Ambulatory Visit: Payer: Self-pay | Admitting: Interventional Cardiology

## 2012-11-06 DIAGNOSIS — Z87891 Personal history of nicotine dependence: Secondary | ICD-10-CM

## 2012-11-06 DIAGNOSIS — Z860100 Personal history of colon polyps, unspecified: Secondary | ICD-10-CM

## 2012-11-06 DIAGNOSIS — I2129 ST elevation (STEMI) myocardial infarction involving other sites: Secondary | ICD-10-CM

## 2012-11-06 DIAGNOSIS — I219 Acute myocardial infarction, unspecified: Secondary | ICD-10-CM

## 2012-11-06 DIAGNOSIS — I2510 Atherosclerotic heart disease of native coronary artery without angina pectoris: Secondary | ICD-10-CM | POA: Diagnosis present

## 2012-11-06 DIAGNOSIS — Z79899 Other long term (current) drug therapy: Secondary | ICD-10-CM

## 2012-11-06 DIAGNOSIS — I739 Peripheral vascular disease, unspecified: Secondary | ICD-10-CM

## 2012-11-06 DIAGNOSIS — K5730 Diverticulosis of large intestine without perforation or abscess without bleeding: Secondary | ICD-10-CM | POA: Diagnosis present

## 2012-11-06 DIAGNOSIS — I252 Old myocardial infarction: Secondary | ICD-10-CM

## 2012-11-06 DIAGNOSIS — E039 Hypothyroidism, unspecified: Secondary | ICD-10-CM

## 2012-11-06 DIAGNOSIS — Z7982 Long term (current) use of aspirin: Secondary | ICD-10-CM

## 2012-11-06 DIAGNOSIS — Z859 Personal history of malignant neoplasm, unspecified: Secondary | ICD-10-CM

## 2012-11-06 DIAGNOSIS — K449 Diaphragmatic hernia without obstruction or gangrene: Secondary | ICD-10-CM | POA: Diagnosis present

## 2012-11-06 DIAGNOSIS — E785 Hyperlipidemia, unspecified: Secondary | ICD-10-CM | POA: Diagnosis present

## 2012-11-06 DIAGNOSIS — I10 Essential (primary) hypertension: Secondary | ICD-10-CM

## 2012-11-06 DIAGNOSIS — Z9861 Coronary angioplasty status: Secondary | ICD-10-CM

## 2012-11-06 DIAGNOSIS — G609 Hereditary and idiopathic neuropathy, unspecified: Secondary | ICD-10-CM | POA: Diagnosis present

## 2012-11-06 DIAGNOSIS — J339 Nasal polyp, unspecified: Secondary | ICD-10-CM | POA: Diagnosis present

## 2012-11-06 DIAGNOSIS — I6529 Occlusion and stenosis of unspecified carotid artery: Secondary | ICD-10-CM | POA: Diagnosis present

## 2012-11-06 DIAGNOSIS — E119 Type 2 diabetes mellitus without complications: Secondary | ICD-10-CM | POA: Diagnosis present

## 2012-11-06 DIAGNOSIS — I25810 Atherosclerosis of coronary artery bypass graft(s) without angina pectoris: Secondary | ICD-10-CM | POA: Diagnosis present

## 2012-11-06 DIAGNOSIS — R031 Nonspecific low blood-pressure reading: Secondary | ICD-10-CM | POA: Diagnosis present

## 2012-11-06 DIAGNOSIS — I214 Non-ST elevation (NSTEMI) myocardial infarction: Principal | ICD-10-CM | POA: Diagnosis present

## 2012-11-06 LAB — PROTIME-INR
INR: 1.03 (ref 0.00–1.49)
Prothrombin Time: 13.4 seconds (ref 11.6–15.2)

## 2012-11-06 LAB — CK TOTAL AND CKMB (NOT AT ARMC)
CK, MB: 2.7 ng/mL (ref 0.3–4.0)
Total CK: 70 U/L (ref 7–232)
Total CK: 59 U/L (ref 7–232)

## 2012-11-06 LAB — GLUCOSE, CAPILLARY
Glucose-Capillary: 173 mg/dL — ABNORMAL HIGH (ref 70–99)
Glucose-Capillary: 125 mg/dL — ABNORMAL HIGH (ref 70–99)

## 2012-11-06 LAB — TROPONIN I: Troponin I: 0.35 ng/mL (ref <0.30)

## 2012-11-06 LAB — APTT: aPTT: 37 seconds (ref 24–37)

## 2012-11-06 MED ORDER — ACETAMINOPHEN 325 MG PO TABS: 650.0000 mg | ORAL_TABLET | ORAL | Status: DC | PRN

## 2012-11-06 MED ORDER — ALPRAZOLAM 0.25 MG PO TABS
0.2500 mg | ORAL_TABLET | Freq: Two times a day (BID) | ORAL | Status: DC | PRN
  Administered 2012-11-06 – 2012-11-07 (×2): 0.25 mg via ORAL
  Filled 2012-11-06 (×2): qty 1

## 2012-11-06 MED ORDER — ONDANSETRON HCL 4 MG/2ML IJ SOLN: 4.0000 mg | Freq: Four times a day (QID) | INTRAMUSCULAR | Status: DC | PRN

## 2012-11-06 MED ORDER — ASPIRIN 81 MG PO CHEW
324.0000 mg | CHEWABLE_TABLET | ORAL | Status: AC
  Administered 2012-11-06: 324 mg via ORAL

## 2012-11-06 MED ORDER — HEPARIN (PORCINE) IN NACL 100-0.45 UNIT/ML-% IJ SOLN
1100.0000 [IU]/h | INTRAMUSCULAR | Status: DC
  Administered 2012-11-06: 1000 [IU]/h via INTRAVENOUS
  Administered 2012-11-07: 1100 [IU]/h via INTRAVENOUS
  Filled 2012-11-06 (×2): qty 250

## 2012-11-06 MED ORDER — HEPARIN BOLUS VIA INFUSION
3000.0000 [IU] | Freq: Once | INTRAVENOUS | Status: AC
  Administered 2012-11-06: 3000 [IU] via INTRAVENOUS
  Filled 2012-11-06: qty 3000

## 2012-11-06 MED ORDER — NITROGLYCERIN 0.4 MG SL SUBL: 0.4000 mg | SUBLINGUAL_TABLET | SUBLINGUAL | Status: DC | PRN

## 2012-11-06 MED ORDER — ASPIRIN 300 MG RE SUPP
300.0000 mg | RECTAL | Status: AC
  Filled 2012-11-06: qty 1

## 2012-11-06 MED ORDER — ASPIRIN EC 81 MG PO TBEC
81.0000 mg | DELAYED_RELEASE_TABLET | Freq: Every day | ORAL | Status: DC
  Administered 2012-11-07 – 2012-11-08 (×2): 81 mg via ORAL
  Filled 2012-11-06 (×3): qty 1

## 2012-11-06 NOTE — Progress Notes (Signed)
CRITICAL VALUE ALERT  Critical value received:  Troponin 0.35  Date of notification:  11/06/2012  Time of notification:  1703  Critical value read back:yes  Nurse who received alert:  Levonne Spiller, Rn  MD notified (1st page):  Dr Eldridge Dace  Time of first page:  1703  MD notified (2nd page):  Time of second page:  Responding MD:  Eldridge Dace  Time MD responded:  [phone]

## 2012-11-06 NOTE — H&P (Signed)
Admit date: 11/06/2012 Referring Physician Primary Cardiologist Eldridge Dace Chief complaint/reason for admission: NSTEMI  HPI: 79 y/o who has had CAD. He had some chest discomfort that was shortlived and would resolve spontaneously. Yesterday, it lasted a little longer so he used some NTG. He had relief. THis morning, he felt very well.  He has had CABG in the past and stents as well.  Discomfort could last up to several hours at a time.  There is no clear association with exertion.  He called our office yesterday and was instructed that if his pain persisted, he should go to the emergency room.  His pain was relieved after nitroglycerin.  He came to our office for followup today.  ECG showed just a left bundle branch block which is unchanged from prior ECG.  We checked a troponin which was mildly positive.  He was asked to come to the hospital for admission.  Currently, he is pain-free.  He has significant peripheral vascular disease as well.  His walking is limited by some claudication but mostly by pain in the bottom of both feet.    PMH:    Past Medical History  Diagnosis Date  . Coronary artery disease     s/p CABG 1996 then PCI of SVG to OM 2007  . Diverticulosis   . Dyslipidemia   . Renal artery stenosis     s/p bilateral stents  . Hiatal hernia   . Colon polyps   . Carotid artery stenosis   . Peripheral neuropathy   . Anemia   . Hypothyroidism   . Nasal polyps   . Hypertension   . Diabetes mellitus   . Throat cancer   . Myocardial infarction   . Peripheral vascular disease     PSH:    Past Surgical History  Procedure Laterality Date  . Coronary artery bypass graft      1996  . Cardiac catheterization      PCI of SVG to OM 2007  . Renal artery stent    . Tracheostomy    . Appendectomy      ALLERGIES:   Review of patient's allergies indicates no known allergies.  Prior to Admit Meds:   Prescriptions prior to admission  Medication Sig Dispense Refill  . acarbose  (PRECOSE) 100 MG tablet Take 100 mg by mouth 3 (three) times daily with meals.       Marland Kitchen albuterol (PROVENTIL HFA;VENTOLIN HFA) 108 (90 BASE) MCG/ACT inhaler Inhale 2 puffs into the lungs at bedtime.       Marland Kitchen amLODipine (NORVASC) 10 MG tablet Take 10 mg by mouth. Pt states he does not know the frequency      . amoxicillin (AMOXIL) 500 MG capsule Take 1 capsule (500 mg total) by mouth 2 (two) times daily.  14 capsule  0  . aspirin 81 MG chewable tablet Chew 81 mg by mouth daily.       Marland Kitchen atorvastatin (LIPITOR) 10 MG tablet Take 10 mg by mouth daily.      . benzonatate (TESSALON) 100 MG capsule Take 1 capsule (100 mg total) by mouth 3 (three) times daily as needed for cough.  21 capsule  0  . clopidogrel (PLAVIX) 75 MG tablet Take 75 mg by mouth daily.       . finasteride (PROSCAR) 5 MG tablet Take 5 mg by mouth daily.       . fluticasone (FLONASE) 50 MCG/ACT nasal spray Place 1 spray into the nose daily.       Marland Kitchen  glipiZIDE (GLUCOTROL) 10 MG tablet Take 10 mg by mouth 2 (two) times daily before a meal.       . HYDROcodone-acetaminophen (VICODIN) 5-500 MG per tablet Take 1 tablet by mouth every 6 (six) hours as needed. For pain      . levothyroxine (SYNTHROID, LEVOTHROID) 50 MCG tablet Take 50 mcg by mouth daily before breakfast.       . levothyroxine (SYNTHROID, LEVOTHROID) 50 MCG tablet Take 50 mcg by mouth daily.        Marland Kitchen lisinopril-hydrochlorothiazide (PRINZIDE,ZESTORETIC) 20-12.5 MG per tablet Take 2 tablets by mouth daily.       . metFORMIN (GLUCOPHAGE) 500 MG tablet Take 2 tablets (1,000 mg total) by mouth 2 (two) times daily with a meal.      . mirtazapine (REMERON) 15 MG tablet Take 7.5 mg by mouth at bedtime.      . Multiple Vitamin (MULITIVITAMIN WITH MINERALS) TABS Take 1 tablet by mouth daily.      . polyethylene glycol powder (GLYCOLAX/MIRALAX) powder       . ranitidine (ZANTAC) 150 MG capsule Take 1 capsule (150 mg total) by mouth daily. As needed for acid reflux  30 capsule  0   Family  HX:    Family History  Problem Relation Age of Onset  . Hypertension Mother   . Heart attack Brother   . Diabetes Son    Social HX:    History   Social History  . Marital Status: Married    Spouse Name: [name]    Number of Children: [name]  . Years of Education: [name]   Occupational History  . Not on file.   Social History Main Topics  . Smoking status: Former Smoker -- 0.30 packs/day    Types: Cigarettes    Quit date: 06/18/1946  . Smokeless tobacco: Not on file  . Alcohol Use: No  . Drug Use: No  . Sexually Active: Not on file   Other Topics Concern  . Not on file   Social History Narrative   ** Merged History Encounter **         ROS:  All 11 ROS were addressed and are negative except what is stated in the HPI  PHYSICAL EXAM 132/70  84 General: Well developed, well nourished, in no acute distress Head: Eyes PERRLA, No xanthomas.   Normal cephalic and atramatic  Lungs:  Clear bilaterally to auscultation and percussion. Heart:   HRRR S1 S2  Abdomen: Bowel sounds are positive, abdomen soft and non-tender without masses or                  Hernia's noted. Msk:  Back normal, normal gait. Normal strength and tone for age. Extremities: No  edema.  Diminished pulses bilaterally Neuro: Alert and oriented X 3. Psych:  Good affect, responds appropriately   Labs:   Lab Results  Component Value Date   WBC 6.3 06/24/2011   HGB 12.2* 10/25/2011   HCT 36.0* 10/25/2011   MCV 88.6 06/24/2011   PLT 270 06/24/2011   No results found for this basename: NA, K, CL, CO2, BUN, CREATININE, CALCIUM, LABALBU, PROT, BILITOT, ALKPHOS, ALT, AST, GLUCOSE,  in the last 168 hours Lab Results  Component Value Date   CKTOTAL 82 05/28/2011   CKMB 3.2 05/28/2011   TROPONINI <0.30 06/24/2011   No results found for this basename: PTT   Lab Results  Component Value Date   INR 0.91 04/26/2011     No results found  for this basename: CHOL   No results found for this basename: HDL   No results found  for this basename: LDLCALC   No results found for this basename: TRIG   No results found for this basename: CHOLHDL   No results found for this basename: LDLDIRECT      Radiology:  @RISRSLT24 @  EKG:  NSR, LBBB  ASSESSMENT: Troponin 0.3. NSTEMI.  Bypass surgery was 18 years ago.  PLAN:  Will anticoagulate.  Follow enzymes.  If he has more symptoms, could consider cardiac cath.  Given the age of his grafts I would prefer to try conservative therapy.  We'll start slow release nitrates.  Check echocardiogram to evaluate LV function.  Peripheral vascular disease.  He is being managed conservatively.  Hypertension: He uses lisinopril/HCTZ and amlodipine.  Diabetes: Metformin, glipizide, a carbose  Hypothyroidism: Synthroid  Corky Crafts., MD  11/06/2012  3:10 PM

## 2012-11-06 NOTE — Progress Notes (Signed)
ANTICOAGULATION CONSULT NOTE - Initial Consult  Pharmacy Consult for Heparin Indication: chest pain/ACS  No Known Allergies  Patient Measurements: Height: 6\' 1"  (185.4 cm) Weight: 154 lb 12.8 oz (70.217 kg) IBW/kg (Calculated) : 79.9 Heparin Dosing Weight: 70.3 kg  Vital Signs: Temp: 98 F (36.7 C) (05/22 1524) Temp src: Oral (05/22 1524) BP: 124/53 mmHg (05/22 1524) Pulse Rate: 79 (05/22 1524)  Labs: No results found for this basename: HGB, HCT, PLT, APTT, LABPROT, INR, HEPARINUNFRC, CREATININE, CKTOTAL, CKMB, TROPONINI,  in the last 72 hours  Estimated Creatinine Clearance: 57.4 ml/min (by C-G formula based on Cr of 0.9).   Medical History: Past Medical History  Diagnosis Date  . Coronary artery disease     s/p CABG 1996 then PCI of SVG to OM 2007  . Diverticulosis   . Dyslipidemia   . Renal artery stenosis     s/p bilateral stents  . Hiatal hernia   . Colon polyps   . Carotid artery stenosis   . Peripheral neuropathy   . Anemia   . Hypothyroidism   . Nasal polyps   . Hypertension   . Diabetes mellitus   . Throat cancer   . Myocardial infarction   . Peripheral vascular disease   . Acute myocardial infarction, unspecified site, initial episode of care     Medications:  Scheduled:  . [START ON 11/07/2012] aspirin EC  81 mg Oral Daily  . heparin  3,000 Units Intravenous Once    Assessment: 79 yr old male with CAD and previous CABG and stents was seen at him cardiologist office for chest discomfort. He was sent to the hospital for  Evaluation. How to start hepairn. Goal of Therapy:  Heparin level 0.3-0.7 units/ml Monitor platelets by anticoagulation protocol: Yes   Plan:  No labs back so will start conservatively with heparin bolus of 3000 units and heparin drip at 1000 units/hr.  Will order heparin level in 8 hrs and daily.  Eugene Garnet 11/06/2012,4:01 PM

## 2012-11-07 ENCOUNTER — Encounter (HOSPITAL_COMMUNITY): Payer: Self-pay | Admitting: Interventional Cardiology

## 2012-11-07 ENCOUNTER — Other Ambulatory Visit: Payer: Self-pay

## 2012-11-07 ENCOUNTER — Encounter (HOSPITAL_COMMUNITY)
Admission: AD | Disposition: A | Discharge status: Home / Self Care | Payer: Self-pay | Source: Ambulatory Visit | Attending: Interventional Cardiology

## 2012-11-07 DIAGNOSIS — I2129 ST elevation (STEMI) myocardial infarction involving other sites: Secondary | ICD-10-CM | POA: Insufficient documentation

## 2012-11-07 HISTORY — PX: LEFT HEART CATHETERIZATION WITH CORONARY/GRAFT ANGIOGRAM: SHX5450

## 2012-11-07 LAB — CBC
Hemoglobin: 9.8 g/dL — ABNORMAL LOW (ref 13.0–17.0)
MCH: 29.5 pg (ref 26.0–34.0)
Platelets: 273 10*3/uL (ref 150–400)
RBC: 3.32 MIL/uL — ABNORMAL LOW (ref 4.22–5.81)
WBC: 4.9 10*3/uL (ref 4.0–10.5)

## 2012-11-07 LAB — CK TOTAL AND CKMB (NOT AT ARMC)
CK, MB: 2.8 ng/mL (ref 0.3–4.0)
Relative Index: INVALID (ref 0.0–2.5)
Total CK: 60 U/L (ref 7–232)

## 2012-11-07 LAB — BASIC METABOLIC PANEL
BUN: 20 mg/dL (ref 6–23)
Calcium: 8.8 mg/dL (ref 8.4–10.5)
Creatinine, Ser: 0.91 mg/dL (ref 0.50–1.35)
GFR calc Af Amer: 86 mL/min — ABNORMAL LOW (ref >90)
GFR calc non Af Amer: 74 mL/min — ABNORMAL LOW (ref >90)

## 2012-11-07 LAB — GLUCOSE, CAPILLARY
Glucose-Capillary: 141 mg/dL — ABNORMAL HIGH (ref 70–99)
Glucose-Capillary: 155 mg/dL — ABNORMAL HIGH (ref 70–99)

## 2012-11-07 LAB — LIPID PANEL
Cholesterol: 123 mg/dL (ref 0–200)
Total CHOL/HDL Ratio: 2.5 RATIO

## 2012-11-07 LAB — HEPARIN LEVEL (UNFRACTIONATED)
Heparin Unfractionated: 0.3 IU/mL (ref 0.30–0.70)
Heparin Unfractionated: 0.45 IU/mL (ref 0.30–0.70)

## 2012-11-07 LAB — HEMOGLOBIN A1C: Hgb A1c MFr Bld: 6.8 % — ABNORMAL HIGH (ref <5.7)

## 2012-11-07 SURGERY — LEFT HEART CATHETERIZATION WITH CORONARY/GRAFT ANGIOGRAM
Anesthesia: LOCAL

## 2012-11-07 MED ORDER — ISOSORBIDE MONONITRATE ER 30 MG PO TB24
30.0000 mg | ORAL_TABLET | Freq: Every day | ORAL | Status: DC
Start: 2012-11-07 — End: 2012-11-08
  Administered 2012-11-07 – 2012-11-08 (×2): 30 mg via ORAL
  Filled 2012-11-07 (×2): qty 1

## 2012-11-07 MED ORDER — LIDOCAINE HCL (PF) 1 % IJ SOLN
INTRAMUSCULAR | Status: AC
  Filled 2012-11-07: qty 30

## 2012-11-07 MED ORDER — FINASTERIDE 5 MG PO TABS
5.0000 mg | ORAL_TABLET | Freq: Every day | ORAL | Status: DC
  Administered 2012-11-07 – 2012-11-08 (×2): 5 mg via ORAL
  Filled 2012-11-07 (×2): qty 1

## 2012-11-07 MED ORDER — METFORMIN HCL 500 MG PO TABS
1000.0000 mg | ORAL_TABLET | Freq: Two times a day (BID) | ORAL | Status: DC
  Filled 2012-11-07: qty 2

## 2012-11-07 MED ORDER — HEPARIN (PORCINE) IN NACL 2-0.9 UNIT/ML-% IJ SOLN
INTRAMUSCULAR | Status: AC
  Filled 2012-11-07: qty 1000

## 2012-11-07 MED ORDER — SODIUM CHLORIDE 0.9 % IV SOLN: INTRAVENOUS | Status: DC

## 2012-11-07 MED ORDER — LEVOTHYROXINE SODIUM 50 MCG PO TABS
50.0000 ug | ORAL_TABLET | Freq: Every day | ORAL | Status: DC
  Administered 2012-11-07 – 2012-11-08 (×2): 50 ug via ORAL
  Filled 2012-11-07 (×3): qty 1

## 2012-11-07 MED ORDER — GLIPIZIDE 5 MG PO TABS
5.0000 mg | ORAL_TABLET | Freq: Two times a day (BID) | ORAL | Status: DC
  Administered 2012-11-08: 5 mg via ORAL
  Filled 2012-11-07 (×5): qty 1

## 2012-11-07 MED ORDER — ATROPINE SULFATE 1 MG/ML IJ SOLN
INTRAMUSCULAR | Status: AC
  Filled 2012-11-07: qty 1

## 2012-11-07 MED ORDER — SODIUM CHLORIDE 0.9 % IV SOLN: 250.0000 mL | INTRAVENOUS | Status: DC | PRN

## 2012-11-07 MED ORDER — ONDANSETRON HCL 4 MG/2ML IJ SOLN
4.0000 mg | Freq: Four times a day (QID) | INTRAMUSCULAR | Status: DC | PRN
Start: 2012-11-07 — End: 2012-11-08

## 2012-11-07 MED ORDER — FENTANYL CITRATE 0.05 MG/ML IJ SOLN
INTRAMUSCULAR | Status: AC
  Filled 2012-11-07: qty 2

## 2012-11-07 MED ORDER — METFORMIN HCL 500 MG PO TABS
1000.0000 mg | ORAL_TABLET | Freq: Two times a day (BID) | ORAL | Status: DC
  Filled 2012-11-07 (×3): qty 2

## 2012-11-07 MED ORDER — HYDROCHLOROTHIAZIDE 12.5 MG PO CAPS
12.5000 mg | ORAL_CAPSULE | Freq: Every day | ORAL | Status: DC
  Administered 2012-11-07 – 2012-11-08 (×2): 12.5 mg via ORAL
  Filled 2012-11-07 (×2): qty 1

## 2012-11-07 MED ORDER — ADENOSINE 6 MG/2ML IV SOLN
INTRAVENOUS | Status: AC
  Filled 2012-11-07: qty 2

## 2012-11-07 MED ORDER — ASPIRIN 81 MG PO CHEW
324.0000 mg | CHEWABLE_TABLET | ORAL | Status: DC
  Filled 2012-11-07: qty 3

## 2012-11-07 MED ORDER — AMLODIPINE BESYLATE 10 MG PO TABS
10.0000 mg | ORAL_TABLET | Freq: Every day | ORAL | Status: DC
  Administered 2012-11-07 – 2012-11-08 (×2): 10 mg via ORAL
  Filled 2012-11-07 (×2): qty 1

## 2012-11-07 MED ORDER — BIVALIRUDIN 250 MG IV SOLR
INTRAVENOUS | Status: AC
  Filled 2012-11-07: qty 250

## 2012-11-07 MED ORDER — ALBUTEROL SULFATE HFA 108 (90 BASE) MCG/ACT IN AERS
2.0000 | INHALATION_SPRAY | Freq: Every day | RESPIRATORY_TRACT | Status: DC
  Filled 2012-11-07 (×2): qty 6.7

## 2012-11-07 MED ORDER — ASPIRIN 81 MG PO CHEW
243.0000 mg | CHEWABLE_TABLET | Freq: Once | ORAL | Status: AC
  Administered 2012-11-07: 243 mg via ORAL

## 2012-11-07 MED ORDER — ACETAMINOPHEN 325 MG PO TABS: 650.0000 mg | ORAL_TABLET | ORAL | Status: DC | PRN

## 2012-11-07 MED ORDER — CLOPIDOGREL BISULFATE 75 MG PO TABS
75.0000 mg | ORAL_TABLET | Freq: Every day | ORAL | Status: DC
  Administered 2012-11-07: 75 mg via ORAL
  Filled 2012-11-07: qty 1

## 2012-11-07 MED ORDER — SODIUM CHLORIDE 0.9 % IJ SOLN: 3.0000 mL | INTRAMUSCULAR | Status: DC | PRN

## 2012-11-07 MED ORDER — ATORVASTATIN CALCIUM 10 MG PO TABS
10.0000 mg | ORAL_TABLET | Freq: Every day | ORAL | Status: DC
  Administered 2012-11-07 – 2012-11-08 (×2): 10 mg via ORAL
  Filled 2012-11-07 (×2): qty 1

## 2012-11-07 MED ORDER — VERAPAMIL HCL 2.5 MG/ML IV SOLN
INTRAVENOUS | Status: AC
  Filled 2012-11-07: qty 2

## 2012-11-07 MED ORDER — HYDROCODONE-ACETAMINOPHEN 5-325 MG PO TABS: 1.0000 | ORAL_TABLET | ORAL | Status: DC | PRN

## 2012-11-07 MED ORDER — LISINOPRIL 20 MG PO TABS
20.0000 mg | ORAL_TABLET | Freq: Every day | ORAL | Status: DC
  Administered 2012-11-07 – 2012-11-08 (×2): 20 mg via ORAL
  Filled 2012-11-07 (×2): qty 1

## 2012-11-07 MED ORDER — FLUTICASONE PROPIONATE 50 MCG/ACT NA SUSP
1.0000 | Freq: Every day | NASAL | Status: DC | PRN
  Filled 2012-11-07: qty 16

## 2012-11-07 MED ORDER — SODIUM CHLORIDE 0.9 % IJ SOLN: 3.0000 mL | Freq: Two times a day (BID) | INTRAMUSCULAR | Status: DC

## 2012-11-07 MED ORDER — LISINOPRIL-HYDROCHLOROTHIAZIDE 20-12.5 MG PO TABS: 1.0000 | ORAL_TABLET | Freq: Two times a day (BID) | ORAL | Status: DC

## 2012-11-07 MED ORDER — ASPIRIN 81 MG PO CHEW: 81.0000 mg | CHEWABLE_TABLET | Freq: Every day | ORAL | Status: DC

## 2012-11-07 NOTE — Progress Notes (Signed)
CRITICAL VALUE ALERT  Critical value received:  Troponin 0.43  Date of notification:  11/06/12  MD notified (1st page):  Dr. Adolm Joseph  Time of first page:  2224  Responding MD:  Dr. Adolm Joseph  Time MD responded:  2224  No new orders. Will continue to monitor

## 2012-11-07 NOTE — Progress Notes (Signed)
Utilization review completed.  

## 2012-11-07 NOTE — Progress Notes (Signed)
ANTICOAGULATION CONSULT NOTE - Initial Consult  Pharmacy Consult for Heparin Indication: chest pain/ACS  No Known Allergies  Patient Measurements: Height: 6\' 1"  (185.4 cm) Weight: 154 lb 12.8 oz (70.217 kg) IBW/kg (Calculated) : 79.9 Heparin Dosing Weight: 70.3 kg  Vital Signs: Temp: 97.5 F (36.4 C) (05/22 2052) Temp src: Axillary (05/22 2052) BP: 157/51 mmHg (05/22 2052) Pulse Rate: 67 (05/22 2052)  Labs:  Recent Labs  11/06/12 1532 11/06/12 1535 11/06/12 2024 11/07/12 0253  APTT  --  37  --   --   LABPROT  --  13.4  --   --   INR  --  1.03  --   --   HEPARINUNFRC  --   --   --  0.30  CKTOTAL 70  --  59  --   CKMB 3.2  --  2.7  --   TROPONINI 0.35*  --  0.43*  --     Estimated Creatinine Clearance: 57.4 ml/min (by C-G formula based on Cr of 0.9).   Medical History: Past Medical History  Diagnosis Date  . Coronary artery disease     s/p CABG 1996 then PCI of SVG to OM 2007  . Diverticulosis   . Dyslipidemia   . Renal artery stenosis     s/p bilateral stents  . Hiatal hernia   . Colon polyps   . Carotid artery stenosis   . Peripheral neuropathy   . Anemia   . Hypothyroidism   . Nasal polyps   . Hypertension   . Diabetes mellitus   . Throat cancer   . Myocardial infarction   . Peripheral vascular disease   . Acute myocardial infarction, unspecified site, initial episode of care     Medications:  Scheduled:  . aspirin EC  81 mg Oral Daily    Assessment: 79 yr old male with CAD and previous CABG and stents was seen at his cardiologist office for chest discomfort. He was sent to the hospital for evaluation. Initial heparin level 0.3 units/ml Goal of Therapy:  Heparin level 0.3-0.7 units/ml Monitor platelets by anticoagulation protocol: Yes   Plan:  Increase heparin drip to 1100 units/hr.  Will order heparin level in 8 hrs.  Talbert Cage Poteet 11/07/2012,4:00 AM

## 2012-11-07 NOTE — CV Procedure (Addendum)
PROCEDURE:   selective coronary angiography, bypass angiography, PCI SVG to OM.  INDICATIONS:  NSTEMI  The risks, benefits, and details of the procedure were explained to the patient.  The patient verbalized understanding and wanted to proceed.  Informed written consent was obtained.  PROCEDURE TECHNIQUE:  After Xylocaine anesthesia a 51F sheath was placed in the right femoral artery with a single anterior needle wall stick.   Left coronary angiography was done using a Judkins L4 guide catheter.  Right coronary angiography was done using a Judkins R4 guide catheter.  Left ventriculography was done using a pigtail catheter.    CONTRAST:  Total of 135 cc.  COMPLICATIONS:  None.    HEMODYNAMICS:  Aortic pressure was 149/57;    ANGIOGRAPHIC DATA:   The left main coronary artery is heavily diseased.  The left anterior descending artery is occluded proximally.  There is minimal flow into a diagonal branch.  The left circumflex artery is occluded proximally.  The circumflex fills from the vein graft.  There is mild diffuse disease in the circumflex.  There is an 80% stenosis before a medium-sized, bifurcating obtuse marginal.  The right coronary artery is subtotally occluded proximally.  The SVG to OM has a 99% proximal stenosis.  The mid and distal vessel stents are widely patent.  The SVG to diagonal and LAD appears patent.  In the distal portion of the graft, there is a 40-50% focal lesion.  There is filling of the diagonal and both antegrade and retrograde filling of the LAD.  The SVG to PDA/OM is widely patent.  There is mild disease in the mid vessel.  There is some native circumflex disease.  The graft fills the circumflex up until the left main.  PCI NARRATIVE:  Angiomax used for anticoagulation.  An AL-1 guiding catheters used to engage the SVG to OM.  A pro-water wire was placed across the area disease.  A 4 mm spider distal embolic protection device was deployed in the distal part of  the graft.  A 2.5 x 15 predilatation balloon was used.  A 3.5 x 18 expedition drug-eluting stent was deployed at 14 atmospheres.  There was sluggish flow after the stent was deployed.  We retrieved the embolic protection device.  There was significant debris in the basket.  There was still sluggish flow.  Multiple doses of adenosine were given intracoronary.  Several doses of nitroglycerin were given intracoronary as well.  Eventually, flow improved.  The patient did have chest discomfort during this portion of the procedure.  His chest discomfort resolved after the transient no reflow resolved.      IMPRESSIONS:  1.  Severe native three-vessel coronary disease. 2.  Culprit vessel is 99% stenosis in SVG to OM.  This was successfully stented with a 3.5 x 18 expedition drug-eluting stent using a distal embolic protection device. 3. The patient did have transient no reflow, which was successfully treated with intracoronary infusion of adenosine. 4. Patent sequential SVG to diagonal/LAD. 5. Patent sequential SVG to PDA/OM.    RECOMMENDATION:  Continue dual antiplatelet therapy for at least a year.  He was already on Plavix.  Possible discharge tomorrow if he feels well while walking around.

## 2012-11-07 NOTE — Progress Notes (Signed)
ANTICOAGULATION CONSULT NOTE - Follow Up Consult  Pharmacy Consult for Heparin Indication: NSTEMI  No Known Allergies  Patient Measurements: Height: 6\' 1"  (185.4 cm) Weight: 152 lb 12.8 oz (69.31 kg) IBW/kg (Calculated) : 79.9 Heparin Dosing Weight:    Vital Signs: Temp: 97.7 F (36.5 C) (05/23 1309) Temp src: Oral (05/23 1309) BP: 118/56 mmHg (05/23 1309) Pulse Rate: 60 (05/23 1309)  Labs:  Recent Labs  11/06/12 1532 11/06/12 1535 11/06/12 2024 11/07/12 0253 11/07/12 0910 11/07/12 1145  HGB  --   --   --   --  9.8*  --   HCT  --   --   --   --  29.4*  --   PLT  --   --   --   --  273  --   APTT  --  37  --   --   --   --   LABPROT  --  13.4  --   --   --   --   INR  --  1.03  --   --   --   --   HEPARINUNFRC  --   --   --  0.30  --  0.45  CREATININE  --   --   --  0.91  --   --   CKTOTAL 70  --  59 60  --   --   CKMB 3.2  --  2.7 2.8  --   --   TROPONINI 0.35*  --  0.43* 0.59*  --   --     Estimated Creatinine Clearance: 56.1 ml/min (by C-G formula based on Cr of 0.91).  Assessment: CP wth NTEMI  Anticoagulation: heparin. Heparin level 0.3 and now 0.45 remains in goal range. Watch Hgb only 9.8. Plts ok at 273. RN did report IV line was out and was oozing about 15 min this am.  Cardiovascular: Known CAD + CABG, PVD/claudication, HLD, HTN. Troponins + x 3. Meds: Norvasc, ASA 81mg , Lipitor, Plavix, HCTZ, Imdur, lisinopril  Endocrinology: hypothyroid on levothyroxine, DM. CBGs 125-173 on glipizide, metformin (Scr 0.91)  Gastrointestinal / Nutrition: h/o diverticulosis, HH,  Nephrology: Scr 0.91 on finasteride  Hematology / Oncology: h/o throat cancer  Goal of Therapy:  Heparin level 0.3-0.7 units/ml Monitor platelets by anticoagulation protocol: Yes   Plan:  Continue heparin at 1100 units/hr and will recheck heparin level in am.  Pasty Spillers 11/07/2012,1:28 PM

## 2012-11-07 NOTE — Progress Notes (Signed)
SUBJECTIVE:  No further chest pain.    OBJECTIVE:   Vitals:   Filed Vitals:   11/06/12 2052 11/07/12 0452 11/07/12 0840 11/07/12 1309  BP: 157/51 117/44 133/78 118/56  Pulse: 67 54 68 60  Temp: 97.5 F (36.4 C) 97.9 F (36.6 C)  97.7 F (36.5 C)  TempSrc: Axillary Oral  Oral  Resp:    16  Height:      Weight:  69.31 kg (152 lb 12.8 oz)    SpO2: 99% 99%  97%   I&O's:   Intake/Output Summary (Last 24 hours) at 11/07/12 1334 Last data filed at 11/07/12 1138  Gross per 24 hour  Intake    600 ml  Output   1075 ml  Net   -475 ml   TELEMETRY: Reviewed telemetry pt in NSR:     PHYSICAL EXAM General: Well developed, well nourished, in no acute distress Head:    Normal cephalic and atramatic  Lungs:   Clear bilaterally to auscultation and percussion. Heart:   HRRR S1 S2  Abdomen: abdomen soft and non-tender Msk:  Back normal, normal gait. Normal strength and tone for age. Extremities: No edema.  DP +1 Neuro: Alert and oriented X 3. Psych:  Good affect, responds appropriately   LABS: Basic Metabolic Panel:  Recent Labs  16/10/96 0253  NA 138  K 4.0  CL 104  CO2 25  GLUCOSE 101*  BUN 20  CREATININE 0.91  CALCIUM 8.8   Liver Function Tests: No results found for this basename: AST, ALT, ALKPHOS, BILITOT, PROT, ALBUMIN,  in the last 72 hours No results found for this basename: LIPASE, AMYLASE,  in the last 72 hours CBC:  Recent Labs  11/07/12 0910  WBC 4.9  HGB 9.8*  HCT 29.4*  MCV 88.6  PLT 273   Cardiac Enzymes:  Recent Labs  11/06/12 1532 11/06/12 2024 11/07/12 0253  CKTOTAL 70 59 60  CKMB 3.2 2.7 2.8  TROPONINI 0.35* 0.43* 0.59*   BNP: No components found with this basename: POCBNP,  D-Dimer: No results found for this basename: DDIMER,  in the last 72 hours Hemoglobin A1C:  Recent Labs  11/06/12 1535  HGBA1C 6.8*   Fasting Lipid Panel:  Recent Labs  11/07/12 0253  CHOL 123  HDL 49  LDLCALC 66  TRIG 38  CHOLHDL 2.5   Thyroid  Function Tests: No results found for this basename: TSH, T4TOTAL, FREET3, T3FREE, THYROIDAB,  in the last 72 hours Anemia Panel: No results found for this basename: VITAMINB12, FOLATE, FERRITIN, TIBC, IRON, RETICCTPCT,  in the last 72 hours Coag Panel:   Lab Results  Component Value Date   INR 1.03 11/06/2012   INR 0.91 04/26/2011    RADIOLOGY: No results found.    ASSESSMENT: NSTEMI  PLAN:  CP better on nitrates and heparin.  WIll plan for cath later today.  He was concerned about his wife at home who has dementia.  He will stay for the cath.   BP stable.   Liquid breakfast given.  Now NPO.    Corky Crafts., MD  11/07/2012  1:34 PM

## 2012-11-08 LAB — BASIC METABOLIC PANEL
CO2: 24 mEq/L (ref 19–32)
Glucose, Bld: 173 mg/dL — ABNORMAL HIGH (ref 70–99)
Potassium: 4 mEq/L (ref 3.5–5.1)
Sodium: 137 mEq/L (ref 135–145)

## 2012-11-08 LAB — CBC
HCT: 33.5 % — ABNORMAL LOW (ref 39.0–52.0)
Hemoglobin: 12.4 g/dL — ABNORMAL LOW (ref 13.0–17.0)
Hemoglobin: 11.4 g/dL — ABNORMAL LOW (ref 13.0–17.0)
MCH: 29.3 pg (ref 26.0–34.0)
MCHC: 34 g/dL (ref 30.0–36.0)
MCV: 87.2 fL (ref 78.0–100.0)
RBC: 4.23 MIL/uL (ref 4.22–5.81)
WBC: 8 10*3/uL (ref 4.0–10.5)

## 2012-11-08 LAB — GLUCOSE, CAPILLARY

## 2012-11-08 MED ORDER — METFORMIN HCL 500 MG PO TABS: 1000.0000 mg | ORAL_TABLET | Freq: Two times a day (BID) | ORAL | Status: DC

## 2012-11-08 MED ORDER — SODIUM CHLORIDE 0.9 % IV BOLUS (SEPSIS)
1000.0000 mL | Freq: Once | INTRAVENOUS | Status: AC
  Administered 2012-11-08: 1000 mL via INTRAVENOUS

## 2012-11-08 MED ORDER — CLOPIDOGREL BISULFATE 300 MG PO TABS
300.0000 mg | ORAL_TABLET | Freq: Once | ORAL | Status: AC
  Administered 2012-11-08: 300 mg via ORAL
  Filled 2012-11-08: qty 1

## 2012-11-08 NOTE — Progress Notes (Signed)
Pt alert and oriented. To d/c area in w/c with RN. Pt reminded to call 911 or dr's office for any issues. Pt stated understanding. Md advised of pt's d/c

## 2012-11-08 NOTE — Progress Notes (Signed)
Report from Night RN. Chart reviewed together. Handoff complete.Introductions complete. Will continue to monitor and advise attending as needed.   

## 2012-11-08 NOTE — Progress Notes (Signed)
CBC Hg 11.4 - stable BP 120-130 stable Feels good. Wants to go home.  Discussed as well with Dr. Eldridge Dace via phone.  Prior normal EF.   Will let him go home.  He knows to contact us if any issues occur.

## 2012-11-08 NOTE — Progress Notes (Addendum)
Spoke to Dr. Eldridge Dace earlier this morning who stated that he will be discharging him, taking care of paperwork/orders. Ambulated well. Call from [place]. He is ready for DC. Wrote order.

## 2012-11-08 NOTE — Progress Notes (Signed)
11/08/12 1308  Vitals  BP ! 70/34 mmHg  MAP (mmHg) 46  Pulse Rate 72  Pt with diaphoresis and slow to respond to questions. Md advised of change in condition. Order received. Will continue to monitor and advise attending as needed.

## 2012-11-08 NOTE — Progress Notes (Signed)
Angiomax off 2030 and ACT checked per MD order at 2240- 170. Sheath removed at 2310 with stable vital signs, groin level 1 with small palpable hematoma at sheath site before removal. Pressure held for 22 minutes with no adverse events. Stable hematoma still present, Level 1. VS stable throughout and post sheath pull. Pressure bandage applied and  Instructions given to patient and understanding verbalized. Will continue to monitor.

## 2012-11-08 NOTE — Discharge Summary (Signed)
Patient ID: [name] MRN: [id number] DOB/AGE: [birth year] 79 y.o.  Admit date: 11/06/2012 Discharge date: 11/08/2012  Primary Discharge DiagnosisNSTEMI Secondary Discharge Diagnosis CAD, hypotension  Significant Diagnostic Studies: angiography: cardiac cath with DES to SVG-OM, 3.5 x 18 Expedition Drug eluting stent  Consults: None  Hospital Course: 79 y/o man who was admitted for chest pain and positive troponin.  He was hemodynamically stable and had cath with above PCI.  He had some no reflow transiently but tolerated the procedure well.  He was ready for d/c the next day.  He had an episode of diaphoresis and hypotension.  He was hydrated and this resolved.  CBCB was stable. He was sent home later that day.     Discharge Exam: Blood pressure 155/60, pulse 71, temperature 97.7 F (36.5 C), temperature source Oral, resp. rate 19, height 6\' 1"  (1.854 m), weight 69.31 kg (152 lb 12.8 oz), SpO2 99.00%.    [place]/AT RRR S1 S2 No wheezing No hematoma Diminished pedal pulses Labs:   Lab Results  Component Value Date   WBC 7.3 11/08/2012   HGB 12.4* 11/08/2012   HCT 36.9* 11/08/2012   MCV 87.2 11/08/2012   PLT 305 11/08/2012    Recent Labs Lab 11/08/12 0604  NA 137  K 4.0  CL 100  CO2 24  BUN 12  CREATININE 0.78  CALCIUM 9.5  GLUCOSE 173*   Lab Results  Component Value Date   CKTOTAL 60 11/07/2012   CKMB 2.8 11/07/2012   TROPONINI 0.59* 11/07/2012    Lab Results  Component Value Date   CHOL 123 11/07/2012   Lab Results  Component Value Date   HDL 49 11/07/2012   Lab Results  Component Value Date   LDLCALC 66 11/07/2012   Lab Results  Component Value Date   TRIG 38 11/07/2012   Lab Results  Component Value Date   CHOLHDL 2.5 11/07/2012   No results found for this basename: LDLDIRECT      Radiology: EKG:NSR, LBBB  FOLLOW UP PLANS AND APPOINTMENTS    Medication List    TAKE these medications       acarbose 100 MG tablet  Commonly known as:  PRECOSE   Take 100 mg by mouth 3 (three) times daily with meals.     albuterol 108 (90 BASE) MCG/ACT inhaler  Commonly known as:  PROVENTIL HFA;VENTOLIN HFA  Inhale 2 puffs into the lungs at bedtime.     amLODipine 10 MG tablet  Commonly known as:  NORVASC  Take 10 mg by mouth daily.     aspirin 81 MG chewable tablet  Chew 81 mg by mouth daily.     atorvastatin 10 MG tablet  Commonly known as:  LIPITOR  Take 10 mg by mouth daily.     clopidogrel 75 MG tablet  Commonly known as:  PLAVIX  Take 75 mg by mouth daily.     ezetimibe-simvastatin 10-20 MG per tablet  Commonly known as:  VYTORIN  Take 1 tablet by mouth at bedtime.     finasteride 5 MG tablet  Commonly known as:  PROSCAR  Take 5 mg by mouth daily.     fluticasone 50 MCG/ACT nasal spray  Commonly known as:  FLONASE  Place 1 spray into the nose daily as needed for rhinitis or allergies.     glipiZIDE 10 MG tablet  Commonly known as:  GLUCOTROL  Take 10 mg by mouth 2 (two) times daily before a meal.     HYDROcodone-acetaminophen  5-500 MG per tablet  Commonly known as:  VICODIN  Take 1 tablet by mouth every 6 (six) hours as needed. For pain     isosorbide mononitrate 30 MG 24 hr tablet  Commonly known as:  IMDUR  Take 30 mg by mouth daily.     levothyroxine 50 MCG tablet  Commonly known as:  SYNTHROID, LEVOTHROID  Take 50 mcg by mouth daily before breakfast.     lisinopril-hydrochlorothiazide 20-12.5 MG per tablet  Commonly known as:  PRINZIDE,ZESTORETIC  Take 1 tablet by mouth 2 (two) times daily.     metFORMIN 500 MG tablet  Commonly known as:  GLUCOPHAGE  Take 2 tablets (1,000 mg total) by mouth 2 (two) times daily with a meal.  Start taking on:  11/09/2012     multivitamin with minerals Tabs  Take 1 tablet by mouth daily.           Follow-up Information   Follow up with Corky Crafts., MD In 2 weeks.   Contact information:   [address] [place] [address] [phone]        BRING ALL MEDICATIONS WITH YOU TO FOLLOW UP APPOINTMENTS  Time spent with patient to include physician time: 25 minutes Signed: Joab Carden S. 11/08/2012, 7:39 AM

## 2012-11-08 NOTE — Progress Notes (Signed)
CARDIAC REHAB PHASE I   PRE:  Rate/Rhythm: 78SR  BP:  Supine:   Sitting: 142/59  Standing:    SaO2:   MODE:  Ambulation: 390 ft   POST:  Rate/Rhythm: 109ST  BP:  Supine:   Sitting: 115/73  Standing:    SaO2:  1008-1052 Pt walked 390 ft with asst x 1 with steady gait. Denied CP or dizziness. C/o feet hurting which limits walking. Encouraged pt to walk as tolerated but did not give written ex ed due to limited mobility. Reviewed NTG use and when to call 911, stent and plavix,ASA use, MI restrictions, and left diabetic and heart healthy diets for family. Pt voiced understanding. Discussed CRP 2 but pt declined due to pain in legs and feet. To recliner after walk with call bell.Tolerated walk well for not having walked since admission. Did not see stent booklet in room but pt stated family may have it. No family in at this time.   Luetta Nutting, RN BSN  11/08/2012 10:47 AM

## 2012-11-08 NOTE — Progress Notes (Signed)
I was asked to assess [name] secondary to transient hypotension prior to discharge.  He was sitting up, eating, IV was taken out. Tim, Charity fundraiser, went back in to assess him and he appeared diaphoretic, slow to respond with questions. Her pressure was taken and was 70/34. He was transferred to the bed without difficulty. Ambulating without difficulty. IV was restarted and 1 L bolus administration began.  After several minutes, hypotension resolved and most recent blood pressure is 116/47 with previous 123/58.  EKG demonstrated left bundle branch block with no changes from prior EKG. He did not complain of any chest discomfort. Blood sugar earlier this morning was 173. He did take his glipizide. He believes that he felt this way because he did not have food. Current blood sugar 266.  I've asked for a stat CBC. He has no complaints of discomfort, no chest pain, no groin pain. His groin site is intact without any evidence of hematoma. He is afebrile. White count is normal this morning.  Neurologic exam is nonfocal, cranial nerves intact.  I spoke to his wife and son in the room. We will observe him to ensure his stability while he is obtaining IV fluids. He currently feels like he is back in his usual state of health.  Possible etiology for transient hypotension include vasovagal reaction. We will update.

## 2012-11-11 MED FILL — Sodium Chloride IV Soln 0.9%: INTRAVENOUS | Qty: 50 | Status: AC

## 2012-11-18 ENCOUNTER — Encounter (HOSPITAL_COMMUNITY): Payer: Self-pay | Admitting: Emergency Medicine

## 2012-11-18 ENCOUNTER — Emergency Department (HOSPITAL_COMMUNITY)
Admission: EM | Admit: 2012-11-18 | Discharge: 2012-11-18 | Disposition: A | Discharge status: Home / Self Care | Payer: Medicare Other | Attending: Emergency Medicine | Admitting: Emergency Medicine

## 2012-11-18 DIAGNOSIS — Z9861 Coronary angioplasty status: Secondary | ICD-10-CM | POA: Insufficient documentation

## 2012-11-18 DIAGNOSIS — G4700 Insomnia, unspecified: Secondary | ICD-10-CM | POA: Insufficient documentation

## 2012-11-18 DIAGNOSIS — Z79899 Other long term (current) drug therapy: Secondary | ICD-10-CM | POA: Insufficient documentation

## 2012-11-18 DIAGNOSIS — Z860100 Personal history of colon polyps, unspecified: Secondary | ICD-10-CM | POA: Insufficient documentation

## 2012-11-18 DIAGNOSIS — E119 Type 2 diabetes mellitus without complications: Secondary | ICD-10-CM | POA: Insufficient documentation

## 2012-11-18 DIAGNOSIS — Z8679 Personal history of other diseases of the circulatory system: Secondary | ICD-10-CM | POA: Insufficient documentation

## 2012-11-18 DIAGNOSIS — Z7902 Long term (current) use of antithrombotics/antiplatelets: Secondary | ICD-10-CM | POA: Insufficient documentation

## 2012-11-18 DIAGNOSIS — E785 Hyperlipidemia, unspecified: Secondary | ICD-10-CM | POA: Insufficient documentation

## 2012-11-18 DIAGNOSIS — Z7982 Long term (current) use of aspirin: Secondary | ICD-10-CM | POA: Insufficient documentation

## 2012-11-18 DIAGNOSIS — Z8709 Personal history of other diseases of the respiratory system: Secondary | ICD-10-CM | POA: Insufficient documentation

## 2012-11-18 DIAGNOSIS — Z8719 Personal history of other diseases of the digestive system: Secondary | ICD-10-CM | POA: Insufficient documentation

## 2012-11-18 DIAGNOSIS — Z87891 Personal history of nicotine dependence: Secondary | ICD-10-CM | POA: Insufficient documentation

## 2012-11-18 DIAGNOSIS — I252 Old myocardial infarction: Secondary | ICD-10-CM | POA: Insufficient documentation

## 2012-11-18 DIAGNOSIS — E039 Hypothyroidism, unspecified: Secondary | ICD-10-CM | POA: Insufficient documentation

## 2012-11-18 DIAGNOSIS — Z930 Tracheostomy status: Secondary | ICD-10-CM | POA: Insufficient documentation

## 2012-11-18 DIAGNOSIS — Z87448 Personal history of other diseases of urinary system: Secondary | ICD-10-CM | POA: Insufficient documentation

## 2012-11-18 DIAGNOSIS — Z862 Personal history of diseases of the blood and blood-forming organs and certain disorders involving the immune mechanism: Secondary | ICD-10-CM | POA: Insufficient documentation

## 2012-11-18 DIAGNOSIS — I10 Essential (primary) hypertension: Secondary | ICD-10-CM | POA: Insufficient documentation

## 2012-11-18 DIAGNOSIS — I2510 Atherosclerotic heart disease of native coronary artery without angina pectoris: Secondary | ICD-10-CM | POA: Insufficient documentation

## 2012-11-18 HISTORY — DX: Gastro-esophageal reflux disease without esophagitis: K21.9

## 2012-11-18 NOTE — ED Provider Notes (Signed)
History     CSN: [id number]  Arrival date & time 11/18/12  0054   None     Chief Complaint  Patient presents with  . Insomnia    (Consider location/radiation/quality/duration/timing/severity/associated sxs/prior treatment) HPI History provided by pt.   Pt c/o insomnia.  Got into bed at 9pm last night and was never able to fall asleep.  Doesn't feel tired. Has never had this problem in the past.  Felt normal yesterday; no recent illnesses.  No caffeine intake.  No recent medication changes.  Denies stress/anxiety.  Had a f/u with his cardiologist today, one week post-op cardiac cath w/ stent placement, discussed chronic and stable SOB, and was reassured.  He denies CP currently.    Past Medical History  Diagnosis Date  . Coronary artery disease     s/p CABG 1996 then PCI of SVG to OM 2007  . Diverticulosis   . Dyslipidemia   . Renal artery stenosis     s/p bilateral stents  . Hiatal hernia   . Colon polyps   . Carotid artery stenosis   . Peripheral neuropathy   . Anemia   . Hypothyroidism   . Nasal polyps   . Hypertension   . Diabetes mellitus   . Throat cancer   . Myocardial infarction   . Peripheral vascular disease   . Acute myocardial infarction, unspecified site, initial episode of care   . Acute myocardial infarction of other lateral wall, initial episode of care   . GERD (gastroesophageal reflux disease)     Past Surgical History  Procedure Laterality Date  . Coronary artery bypass graft      1996  . Cardiac catheterization      PCI of SVG to OM 2007  . Renal artery stent    . Tracheostomy    . Appendectomy      Family History  Problem Relation Age of Onset  . Hypertension Mother   . Heart attack Brother   . Diabetes Son     History  Substance Use Topics  . Smoking status: Former Smoker -- 0.30 packs/day    Types: Cigarettes    Quit date: 06/18/1946  . Smokeless tobacco: Not on file  . Alcohol Use: No      Review of Systems  All other  systems reviewed and are negative.    Allergies  Review of patient's allergies indicates no known allergies.  Home Medications   Current Outpatient Rx  Name  Route  Sig  Dispense  Refill  . acarbose (PRECOSE) 100 MG tablet   Oral   Take 100 mg by mouth 3 (three) times daily with meals.          Marland Kitchen albuterol (PROVENTIL HFA;VENTOLIN HFA) 108 (90 BASE) MCG/ACT inhaler   Inhalation   Inhale 2 puffs into the lungs at bedtime.          Marland Kitchen amLODipine (NORVASC) 10 MG tablet   Oral   Take 10 mg by mouth daily.          Marland Kitchen aspirin 81 MG chewable tablet   Oral   Chew 81 mg by mouth daily.          Marland Kitchen atorvastatin (LIPITOR) 10 MG tablet   Oral   Take 10 mg by mouth daily.         . clopidogrel (PLAVIX) 75 MG tablet   Oral   Take 75 mg by mouth daily.          Marland Kitchen  ezetimibe-simvastatin (VYTORIN) 10-20 MG per tablet   Oral   Take 1 tablet by mouth at bedtime.         . finasteride (PROSCAR) 5 MG tablet   Oral   Take 5 mg by mouth daily.          . fluticasone (FLONASE) 50 MCG/ACT nasal spray   Nasal   Place 1 spray into the nose daily as needed for rhinitis or allergies.          Marland Kitchen glipiZIDE (GLUCOTROL) 10 MG tablet   Oral   Take 10 mg by mouth 2 (two) times daily before a meal.          . HYDROcodone-acetaminophen (VICODIN) 5-500 MG per tablet   Oral   Take 1 tablet by mouth every 6 (six) hours as needed. For pain         . isosorbide mononitrate (IMDUR) 30 MG 24 hr tablet   Oral   Take 30 mg by mouth daily.         Marland Kitchen levothyroxine (SYNTHROID, LEVOTHROID) 50 MCG tablet   Oral   Take 50 mcg by mouth daily before breakfast.          . lisinopril-hydrochlorothiazide (PRINZIDE,ZESTORETIC) 20-12.5 MG per tablet   Oral   Take 1 tablet by mouth 2 (two) times daily.          . metFORMIN (GLUCOPHAGE) 500 MG tablet   Oral   Take 2 tablets (1,000 mg total) by mouth 2 (two) times daily with a meal.         . Multiple Vitamin (MULITIVITAMIN WITH  MINERALS) TABS   Oral   Take 1 tablet by mouth daily.           BP 142/67  Pulse 92  Temp(Src) 97.7 F (36.5 C) (Oral)  Resp 17  SpO2 100%  Physical Exam  Nursing note and vitals reviewed. Constitutional: He is oriented to person, place, and time. He appears well-developed and well-nourished. No distress.  HENT:  Head: Normocephalic and atraumatic.  Eyes:  Normal appearance  Neck: Normal range of motion.  Cardiovascular: Normal rate and regular rhythm.   Pulmonary/Chest: Effort normal and breath sounds normal. No respiratory distress.  Musculoskeletal: Normal range of motion.  Neurological: He is alert and oriented to person, place, and time.  Skin: Skin is warm and dry. No rash noted.  Psychiatric: He has a normal mood and affect. His behavior is normal.    ED Course  Procedures (including critical care time)   Date: 11/18/2012  Rate: 76  Rhythm: normal sinus rhythm  QRS Axis: left  Intervals: normal  ST/T Wave abnormalities: normal  Conduction Disutrbances:left bundle branch block  Narrative Interpretation:   Old EKG Reviewed: unchanged   Labs Reviewed - No data to display No results found.   1. Insomnia       MDM  79yo M presents w/ inability to fall asleep tonight.  Otherwise feeling well.  1 week s/p cardiac cath w/ stent placement.  Denies CP and has chronic/stable SOB.  Evaluated by cardiologist yesterday, symptoms discussed, and he was reassured.  No significant exam findings.  EKG today non-ischemic and unchanged from prior.  I recommended that he try 25mg  benadryl when he can't fall asleep, and f/u with his PCP for recurrent sx.  Dr. Norlene Campbell in agreement w/ plan.          Otilio Miu, PA-C 11/18/12 [phone]

## 2012-11-18 NOTE — ED Notes (Signed)
PT. REPORTS " CAN'T SLEEP" THIS EVENING , UNSURE IF HE TOOK HIS MEDICATIONS TWICE TODAY , DENIES CHEST PAIN OR DISCOMFORT , RESPIRATIONS UNLABORED , AMBULATORY.

## 2012-11-18 NOTE — ED Provider Notes (Signed)
Medical screening examination/treatment/procedure(s) were performed by non-physician practitioner and as supervising physician I was immediately available for consultation/collaboration.  Chauncey Bruno M Santo Zahradnik, MD 11/18/12 0727 

## 2012-11-18 NOTE — ED Notes (Signed)
Pt reports that he couldn't fall asleep tonight which is abnormal for him. States that "maybe I took extra medication, I don't know. I just can't figure out why I couldn't fall asleep." Pt denies any complaint otherwise including pain, SOB, blurred vision or dizziness. Wife at bedside. Pt ambulates independently.

## 2013-01-04 ENCOUNTER — Emergency Department (INDEPENDENT_AMBULATORY_CARE_PROVIDER_SITE_OTHER)
Admission: EM | Admit: 2013-01-04 | Discharge: 2013-01-04 | Disposition: A | Discharge status: Home / Self Care | Payer: Medicare Other | Source: Home / Self Care | Attending: Family Medicine | Admitting: Family Medicine

## 2013-01-04 ENCOUNTER — Encounter (HOSPITAL_COMMUNITY): Payer: Self-pay | Admitting: *Deleted

## 2013-01-04 ENCOUNTER — Emergency Department (INDEPENDENT_AMBULATORY_CARE_PROVIDER_SITE_OTHER): Payer: Medicare Other

## 2013-01-04 DIAGNOSIS — F411 Generalized anxiety disorder: Secondary | ICD-10-CM

## 2013-01-04 DIAGNOSIS — F418 Other specified anxiety disorders: Secondary | ICD-10-CM

## 2013-01-04 MED ORDER — CLONAZEPAM 0.5 MG PO TABS: 0.5000 mg | ORAL_TABLET | Freq: Two times a day (BID) | ORAL | Status: DC | PRN

## 2013-01-04 NOTE — ED Notes (Addendum)
Wife has alzheimer's disease and is about to worry him to death.  He can't sleep.  He saw Dr. Pete Glatter is his PCP  1 month ago and was given something OTC to clear the mucous out of his throat. ? Mucinex. C/o SOB at night for 1 yr. Pt. has a trach.  No acute resp. distess.

## 2013-01-04 NOTE — ED Provider Notes (Signed)
History    CSN: [id number] Arrival date & time 01/04/13  1556  First MD Initiated Contact with Patient 01/04/13 1619     No chief complaint on file.  (Consider location/radiation/quality/duration/timing/severity/associated sxs/prior Treatment) Patient is a 79 y.o. male presenting with anxiety. The history is provided by the patient.  Anxiety This is a chronic problem. The current episode started more than 1 week ago. The problem has been gradually worsening (assoc with wife"s deteriorating Alzheimer's cond.). Associated symptoms include shortness of breath. Pertinent negatives include no chest pain, no abdominal pain and no headaches.   Past Medical History  Diagnosis Date  . Coronary artery disease     s/p CABG 1996 then PCI of SVG to OM 2007  . Diverticulosis   . Dyslipidemia   . Renal artery stenosis     s/p bilateral stents  . Hiatal hernia   . Colon polyps   . Carotid artery stenosis   . Peripheral neuropathy   . Anemia   . Hypothyroidism   . Nasal polyps   . Hypertension   . Diabetes mellitus   . Throat cancer   . Myocardial infarction   . Peripheral vascular disease   . Acute myocardial infarction, unspecified site, initial episode of care   . Acute myocardial infarction of other lateral wall, initial episode of care   . GERD (gastroesophageal reflux disease)    Past Surgical History  Procedure Laterality Date  . Coronary artery bypass graft      1996  . Cardiac catheterization      PCI of SVG to OM 2007  . Renal artery stent    . Tracheostomy    . Appendectomy     Family History  Problem Relation Age of Onset  . Hypertension Mother   . Heart attack Brother   . Diabetes Son    History  Substance Use Topics  . Smoking status: Former Smoker -- 0.30 packs/day    Types: Cigarettes    Quit date: 06/18/1946  . Smokeless tobacco: Not on file  . Alcohol Use: No    Review of Systems  Constitutional: Negative.   Respiratory: Positive for shortness of  breath. Negative for cough and chest tightness.   Cardiovascular: Negative for chest pain and leg swelling.  Gastrointestinal: Negative for abdominal pain.  Neurological: Negative for headaches.  Psychiatric/Behavioral: Positive for sleep disturbance. The patient is nervous/anxious.     Allergies  Review of patient's allergies indicates no known allergies.  Home Medications   Current Outpatient Rx  Name  Route  Sig  Dispense  Refill  . acarbose (PRECOSE) 100 MG tablet   Oral   Take 100 mg by mouth 3 (three) times daily with meals.          Marland Kitchen albuterol (PROVENTIL HFA;VENTOLIN HFA) 108 (90 BASE) MCG/ACT inhaler   Inhalation   Inhale 2 puffs into the lungs at bedtime.          Marland Kitchen amLODipine (NORVASC) 10 MG tablet   Oral   Take 10 mg by mouth daily.          Marland Kitchen aspirin 81 MG chewable tablet   Oral   Chew 81 mg by mouth daily.          Marland Kitchen atorvastatin (LIPITOR) 10 MG tablet   Oral   Take 10 mg by mouth daily.         . clopidogrel (PLAVIX) 75 MG tablet   Oral   Take 75 mg by mouth  daily.          . ezetimibe-simvastatin (VYTORIN) 10-20 MG per tablet   Oral   Take 1 tablet by mouth at bedtime.         . finasteride (PROSCAR) 5 MG tablet   Oral   Take 5 mg by mouth daily.          . fluticasone (FLONASE) 50 MCG/ACT nasal spray   Nasal   Place 1 spray into the nose daily as needed for rhinitis or allergies.          Marland Kitchen glipiZIDE (GLUCOTROL) 10 MG tablet   Oral   Take 10 mg by mouth 2 (two) times daily before a meal.          . isosorbide mononitrate (IMDUR) 30 MG 24 hr tablet   Oral   Take 30 mg by mouth daily.         Marland Kitchen levothyroxine (SYNTHROID, LEVOTHROID) 50 MCG tablet   Oral   Take 50 mcg by mouth daily before breakfast.          . lisinopril-hydrochlorothiazide (PRINZIDE,ZESTORETIC) 20-12.5 MG per tablet   Oral   Take 1 tablet by mouth 2 (two) times daily.          . metFORMIN (GLUCOPHAGE) 500 MG tablet   Oral   Take 2 tablets  (1,000 mg total) by mouth 2 (two) times daily with a meal.         . Multiple Vitamin (MULITIVITAMIN WITH MINERALS) TABS   Oral   Take 1 tablet by mouth daily.         . clonazePAM (KLONOPIN) 0.5 MG tablet   Oral   Take 1 tablet (0.5 mg total) by mouth 2 (two) times daily as needed for anxiety.   20 tablet   0   . HYDROcodone-acetaminophen (VICODIN) 5-500 MG per tablet   Oral   Take 1 tablet by mouth every 6 (six) hours as needed. For pain          BP 146/71  Pulse 88  Temp(Src) 98.3 F (36.8 C) (Oral)  Resp 19  SpO2 100% Physical Exam  Nursing note and vitals reviewed. Constitutional: He is oriented to person, place, and time. He appears well-developed and well-nourished. No distress.  HENT:  Mouth/Throat: Oropharynx is clear and moist.  Neck: Normal range of motion. Neck supple.  Has tracheostomy   Cardiovascular: Normal rate and regular rhythm.   Pulmonary/Chest: Breath sounds normal.  Neurological: He is alert and oriented to person, place, and time. No cranial nerve deficit.  Skin: Skin is warm and dry.  Psychiatric: He has a normal mood and affect. His behavior is normal. Judgment and thought content normal.    ED Course  Procedures (including critical care time) Labs Reviewed - No data to display Dg Chest 2 View  01/04/2013   *RADIOLOGY REPORT*  Clinical Data: Shortness of breath, cough, throat cancer  CHEST - 2 VIEW  Comparison: 08/01/2012  Findings: Lungs are essentially clear.  No focal consolidation. No pleural effusion or pneumothorax.  The heart is normal in size. Postsurgical changes related to prior CABG.  Visualized osseous structures are within normal limits.  IMPRESSION: No evidence of acute cardiopulmonary disease.   Original Report Authenticated By: Charline Bills, M.D.   1. Situational anxiety     MDM  X-rays reviewed and report per radiologist.     Linna Hoff, MD 01/04/13 1714

## 2013-04-30 ENCOUNTER — Other Ambulatory Visit: Payer: Self-pay | Admitting: *Deleted

## 2013-04-30 ENCOUNTER — Other Ambulatory Visit (HOSPITAL_COMMUNITY): Payer: Self-pay

## 2013-04-30 DIAGNOSIS — I739 Peripheral vascular disease, unspecified: Secondary | ICD-10-CM

## 2013-04-30 DIAGNOSIS — Z01810 Encounter for preprocedural cardiovascular examination: Secondary | ICD-10-CM

## 2013-05-07 ENCOUNTER — Encounter: Payer: Self-pay | Admitting: Interventional Cardiology

## 2013-05-17 ENCOUNTER — Encounter (HOSPITAL_COMMUNITY): Payer: Self-pay | Admitting: Emergency Medicine

## 2013-05-17 ENCOUNTER — Emergency Department (INDEPENDENT_AMBULATORY_CARE_PROVIDER_SITE_OTHER)
Admission: EM | Admit: 2013-05-17 | Discharge: 2013-05-17 | Disposition: A | Discharge status: Nursing Home (Includes ICF) | Payer: Medicare Other | Source: Home / Self Care | Attending: Emergency Medicine | Admitting: Emergency Medicine

## 2013-05-17 ENCOUNTER — Emergency Department (INDEPENDENT_AMBULATORY_CARE_PROVIDER_SITE_OTHER): Payer: Medicare Other

## 2013-05-17 ENCOUNTER — Emergency Department (HOSPITAL_COMMUNITY)
Admission: EM | Admit: 2013-05-17 | Discharge: 2013-05-17 | Disposition: A | Discharge status: Home / Self Care | Payer: Medicare Other | Attending: Emergency Medicine | Admitting: Emergency Medicine

## 2013-05-17 DIAGNOSIS — Z860100 Personal history of colon polyps, unspecified: Secondary | ICD-10-CM | POA: Insufficient documentation

## 2013-05-17 DIAGNOSIS — Z951 Presence of aortocoronary bypass graft: Secondary | ICD-10-CM | POA: Insufficient documentation

## 2013-05-17 DIAGNOSIS — Z79899 Other long term (current) drug therapy: Secondary | ICD-10-CM | POA: Insufficient documentation

## 2013-05-17 DIAGNOSIS — Z7982 Long term (current) use of aspirin: Secondary | ICD-10-CM | POA: Insufficient documentation

## 2013-05-17 DIAGNOSIS — I2510 Atherosclerotic heart disease of native coronary artery without angina pectoris: Secondary | ICD-10-CM | POA: Insufficient documentation

## 2013-05-17 DIAGNOSIS — R0600 Dyspnea, unspecified: Secondary | ICD-10-CM

## 2013-05-17 DIAGNOSIS — R0601 Orthopnea: Secondary | ICD-10-CM

## 2013-05-17 DIAGNOSIS — Z85819 Personal history of malignant neoplasm of unspecified site of lip, oral cavity, and pharynx: Secondary | ICD-10-CM | POA: Insufficient documentation

## 2013-05-17 DIAGNOSIS — E039 Hypothyroidism, unspecified: Secondary | ICD-10-CM | POA: Insufficient documentation

## 2013-05-17 DIAGNOSIS — R05 Cough: Secondary | ICD-10-CM | POA: Insufficient documentation

## 2013-05-17 DIAGNOSIS — Z7902 Long term (current) use of antithrombotics/antiplatelets: Secondary | ICD-10-CM | POA: Insufficient documentation

## 2013-05-17 DIAGNOSIS — E785 Hyperlipidemia, unspecified: Secondary | ICD-10-CM | POA: Insufficient documentation

## 2013-05-17 DIAGNOSIS — Z930 Tracheostomy status: Secondary | ICD-10-CM | POA: Insufficient documentation

## 2013-05-17 DIAGNOSIS — E119 Type 2 diabetes mellitus without complications: Secondary | ICD-10-CM | POA: Insufficient documentation

## 2013-05-17 DIAGNOSIS — I10 Essential (primary) hypertension: Secondary | ICD-10-CM | POA: Insufficient documentation

## 2013-05-17 DIAGNOSIS — Z87891 Personal history of nicotine dependence: Secondary | ICD-10-CM | POA: Insufficient documentation

## 2013-05-17 DIAGNOSIS — R0609 Other forms of dyspnea: Secondary | ICD-10-CM | POA: Insufficient documentation

## 2013-05-17 DIAGNOSIS — I252 Old myocardial infarction: Secondary | ICD-10-CM | POA: Insufficient documentation

## 2013-05-17 DIAGNOSIS — R0989 Other specified symptoms and signs involving the circulatory and respiratory systems: Secondary | ICD-10-CM | POA: Insufficient documentation

## 2013-05-17 DIAGNOSIS — Z9861 Coronary angioplasty status: Secondary | ICD-10-CM | POA: Insufficient documentation

## 2013-05-17 DIAGNOSIS — K219 Gastro-esophageal reflux disease without esophagitis: Secondary | ICD-10-CM | POA: Insufficient documentation

## 2013-05-17 DIAGNOSIS — R059 Cough, unspecified: Secondary | ICD-10-CM | POA: Insufficient documentation

## 2013-05-17 LAB — CBC
HCT: 33.5 % — ABNORMAL LOW (ref 39.0–52.0)
Hemoglobin: 11.5 g/dL — ABNORMAL LOW (ref 13.0–17.0)
MCH: 30.7 pg (ref 26.0–34.0)
MCV: 89.6 fL (ref 78.0–100.0)
RBC: 3.74 MIL/uL — ABNORMAL LOW (ref 4.22–5.81)

## 2013-05-17 LAB — BASIC METABOLIC PANEL
BUN: 22 mg/dL (ref 6–23)
CO2: 27 mEq/L (ref 19–32)
Chloride: 101 mEq/L (ref 96–112)
Creatinine, Ser: 0.85 mg/dL (ref 0.50–1.35)
Glucose, Bld: 157 mg/dL — ABNORMAL HIGH (ref 70–99)

## 2013-05-17 LAB — URINALYSIS, ROUTINE W REFLEX MICROSCOPIC
Glucose, UA: NEGATIVE mg/dL
Hgb urine dipstick: NEGATIVE
Leukocytes, UA: NEGATIVE
Specific Gravity, Urine: 1.013 (ref 1.005–1.030)

## 2013-05-17 MED ORDER — FUROSEMIDE 20 MG PO TABS: 20.0000 mg | ORAL_TABLET | Freq: Every day | ORAL | Status: DC

## 2013-05-17 NOTE — ED Notes (Signed)
79 yr old male is here today with complaints of SOB x yesterday. He states he got worse last night and when he lays down. He is taking care of her wife and it is putting stress on him . He states he is trying to get her in an assisted living home but she is refusing to go.No other complaints.

## 2013-05-17 NOTE — ED Provider Notes (Signed)
CSN: [id number]     Arrival date & time 05/17/13  1538 History   First MD Initiated Contact with Patient 05/17/13 1704     Chief Complaint  Patient presents with  . Shortness of Breath   (Consider location/radiation/quality/duration/timing/severity/associated sxs/prior Treatment) HPI Comments: Patient presents to UC with reports of new onset orthopnea x 48 hours. Patient states "I cannot breathe when I lie down." Prior to onset of symptoms he slept on a single pillow and now he finds himself having to sleep on at least two pillows. Denies chest pain at rest or with exertion. Denies cough or fever. Denies pedal edema. Has known PMHx of CAD, s/p CABG, asthma, HTN, T2 DM, angina, hypothyroidism. Is also with stoma s/p head/neck surgery for throat cancer. Denies increase in secretions or blood in secretions from stoma.  The history is provided by the patient.    Past Medical History  Diagnosis Date  . Coronary artery disease     s/p CABG 1996 then PCI of SVG to OM 2007  . Diverticulosis   . Dyslipidemia   . Renal artery stenosis     s/p bilateral stents  . Hiatal hernia   . Colon polyps   . Carotid artery stenosis   . Peripheral neuropathy   . Anemia   . Hypothyroidism   . Nasal polyps   . Hypertension   . Diabetes mellitus   . Throat cancer   . Myocardial infarction   . Peripheral vascular disease   . Acute myocardial infarction, unspecified site, initial episode of care   . Acute myocardial infarction of other lateral wall, initial episode of care   . GERD (gastroesophageal reflux disease)    Past Surgical History  Procedure Laterality Date  . Coronary artery bypass graft      1996  . Cardiac catheterization      PCI of SVG to OM 2007  . Renal artery stent    . Tracheostomy    . Appendectomy     Family History  Problem Relation Age of Onset  . Hypertension Mother   . Heart attack Brother   . Diabetes Son    History  Substance Use Topics  . Smoking status: Former  Smoker -- 0.30 packs/day    Types: Cigarettes    Quit date: 06/18/1946  . Smokeless tobacco: Not on file  . Alcohol Use: No    Review of Systems  Constitutional: Negative.   HENT: Negative.   Eyes: Negative.   Cardiovascular: Negative.   Gastrointestinal: Negative.   Endocrine: Negative for polydipsia, polyphagia and polyuria.  Genitourinary: Negative.   Musculoskeletal: Negative.   Skin: Negative.   Neurological: Negative.   Hematological: Negative for adenopathy. Does not bruise/bleed easily.  Psychiatric/Behavioral: Negative.     Allergies  Review of patient's allergies indicates no known allergies.  Home Medications   Current Outpatient Rx  Name  Route  Sig  Dispense  Refill  . acarbose (PRECOSE) 100 MG tablet   Oral   Take 100 mg by mouth 3 (three) times daily with meals.          Marland Kitchen albuterol (PROVENTIL HFA;VENTOLIN HFA) 108 (90 BASE) MCG/ACT inhaler   Inhalation   Inhale 2 puffs into the lungs at bedtime.          Marland Kitchen amLODipine (NORVASC) 10 MG tablet   Oral   Take 10 mg by mouth daily.          Marland Kitchen aspirin 81 MG chewable tablet  Oral   Chew 81 mg by mouth daily.          Marland Kitchen atorvastatin (LIPITOR) 10 MG tablet   Oral   Take 10 mg by mouth daily.         . clonazePAM (KLONOPIN) 0.5 MG tablet   Oral   Take 1 tablet (0.5 mg total) by mouth 2 (two) times daily as needed for anxiety.   20 tablet   0   . clopidogrel (PLAVIX) 75 MG tablet   Oral   Take 75 mg by mouth daily.          Marland Kitchen ezetimibe-simvastatin (VYTORIN) 10-20 MG per tablet   Oral   Take 1 tablet by mouth at bedtime.         . finasteride (PROSCAR) 5 MG tablet   Oral   Take 5 mg by mouth daily.          . fluticasone (FLONASE) 50 MCG/ACT nasal spray   Nasal   Place 1 spray into the nose daily as needed for rhinitis or allergies.          Marland Kitchen glipiZIDE (GLUCOTROL) 10 MG tablet   Oral   Take 10 mg by mouth 2 (two) times daily before a meal.          .  HYDROcodone-acetaminophen (VICODIN) 5-500 MG per tablet   Oral   Take 1 tablet by mouth every 6 (six) hours as needed. For pain         . isosorbide mononitrate (IMDUR) 30 MG 24 hr tablet   Oral   Take 30 mg by mouth daily.         Marland Kitchen levothyroxine (SYNTHROID, LEVOTHROID) 50 MCG tablet   Oral   Take 50 mcg by mouth daily before breakfast.          . lisinopril-hydrochlorothiazide (PRINZIDE,ZESTORETIC) 20-12.5 MG per tablet   Oral   Take 1 tablet by mouth 2 (two) times daily.          . metFORMIN (GLUCOPHAGE) 500 MG tablet   Oral   Take 2 tablets (1,000 mg total) by mouth 2 (two) times daily with a meal.         . Multiple Vitamin (MULITIVITAMIN WITH MINERALS) TABS   Oral   Take 1 tablet by mouth daily.          BP 131/65  Pulse 74  Temp(Src) 98.2 F (36.8 C) (Oral)  Resp 20  SpO2 100% Physical Exam  Nursing note and vitals reviewed. Constitutional: He is oriented to person, place, and time. He appears well-developed and well-nourished. No distress.  HENT:  Head: Normocephalic and atraumatic.  Eyes: Conjunctivae are normal.  Neck: Normal range of motion. Neck supple. No JVD present. No tracheal deviation present. No thyromegaly present.  Cardiovascular: Normal rate, regular rhythm and normal heart sounds.  Exam reveals no gallop and no friction rub.   No murmur heard. Occasional ectopic beat  Pulmonary/Chest: Effort normal and breath sounds normal. No stridor. No respiratory distress. He has no wheezes. He has no rales. He exhibits no tenderness.  Abdominal: Soft. Bowel sounds are normal. He exhibits no distension and no mass. There is no tenderness.  Musculoskeletal: He exhibits no edema and no tenderness.  Lymphadenopathy:    He has no cervical adenopathy.  Neurological: He is alert and oriented to person, place, and time.  Skin: Skin is warm and dry.  Psychiatric: He has a normal mood and affect. His behavior is normal.  ED Course  Procedures  (including critical care time) Labs Review Labs Reviewed - No data to display Imaging Review Dg Chest 2 View  05/17/2013   CLINICAL DATA:  Shortness of breath.  History COPD  EXAM: CHEST  2 VIEW  COMPARISON:  01/04/2013  FINDINGS: Stable cardiomegaly. Status post median sternotomy for CABG. Heavy atherosclerotic calcification of the thoracic aorta. Pulmonary vascularity is normal. The lungs appear emphysematous and are mildly hyperinflated. No airspace disease or pleural effusion. No acute osseous abnormality.  IMPRESSION: Stable chest radiograph. Emphysema/ COPD and stable mild cardiomegaly. No acute findings.   Electronically Signed   By: Britta Mccreedy M.D.   On: 05/17/2013 17:59    EKG Interpretation    Date/Time:    Ventricular Rate:    PR Interval:    QRS Duration:   QT Interval:    QTC Calculation:   R Axis:     Text Interpretation:              MDM  79 y/o male with new onset orthopnea x 48 hours. Case discussed with and patient examined by Dr. Lorenz Coaster. Appears stable and advised transfer to [facility] for further evaluation.     Jess Barters [place], [place] 05/17/13 1946

## 2013-05-17 NOTE — ED Notes (Signed)
Pt sent from [facility] for further eval of SOB. States he has felt SOB when lying flat for past several days. No pain. States he has been coughing up mucous. No fevers.

## 2013-05-17 NOTE — ED Notes (Signed)
No c/o shortness of breath.   

## 2013-05-17 NOTE — ED Notes (Signed)
Patient states he has a hard time breathing when laying flat.  He states this started last night.  Patient denies CP. Patient's VS WNL.  Patient is still in waiting room with wife.  They were made aware to let us know if anything changed

## 2013-05-17 NOTE — ED Provider Notes (Signed)
Medical screening examination/treatment/procedure(s) were performed by non-physician practitioner and as supervising physician I was immediately available for consultation/collaboration.  Leslee Home, M.D.  Reuben Likes, MD 05/17/13 2026

## 2013-05-17 NOTE — ED Notes (Signed)
O2 100% while ambulating

## 2013-05-17 NOTE — ED Notes (Signed)
Pt presents to department for evaluation of SOB. States he became SOB yesterday, becomes worse at night when sleeping. Denies chest pain. Respirations unlabored. Speaking complete sentences. Lung sounds clear and equal bilaterally.

## 2013-05-17 NOTE — ED Provider Notes (Signed)
CSN: [id number]     Arrival date & time 05/17/13  1808 History   First MD Initiated Contact with Patient 05/17/13 1856     Chief Complaint  Patient presents with  . Shortness of Breath   (Consider location/radiation/quality/duration/timing/severity/associated sxs/prior Treatment) Patient is a 79 y.o. male presenting with shortness of breath.  Shortness of Breath Associated symptoms: cough   Associated symptoms: no abdominal pain, no chest pain, no headaches, no rash and no vomiting    patient presents with shortness of breath over last couple days. Comes and goes. He's had a mild productive cough. No fevers. He has a chronic tracheostomy. No chest pain. No fevers. He was seen in urgent care and sent here. No swelling in his legs.  Past Medical History  Diagnosis Date  . Coronary artery disease     s/p CABG 1996 then PCI of SVG to OM 2007  . Diverticulosis   . Dyslipidemia   . Renal artery stenosis     s/p bilateral stents  . Hiatal hernia   . Colon polyps   . Carotid artery stenosis   . Peripheral neuropathy   . Anemia   . Hypothyroidism   . Nasal polyps   . Hypertension   . Diabetes mellitus   . Throat cancer   . Myocardial infarction   . Peripheral vascular disease   . Acute myocardial infarction, unspecified site, initial episode of care   . Acute myocardial infarction of other lateral wall, initial episode of care   . GERD (gastroesophageal reflux disease)    Past Surgical History  Procedure Laterality Date  . Coronary artery bypass graft      1996  . Cardiac catheterization      PCI of SVG to OM 2007  . Renal artery stent    . Tracheostomy    . Appendectomy     Family History  Problem Relation Age of Onset  . Hypertension Mother   . Heart attack Brother   . Diabetes Son    History  Substance Use Topics  . Smoking status: Former Smoker -- 0.30 packs/day    Types: Cigarettes    Quit date: 06/18/1946  . Smokeless tobacco: Not on file  . Alcohol Use: No     Review of Systems  Constitutional: Negative for activity change and appetite change.  Eyes: Negative for pain.  Respiratory: Positive for cough and shortness of breath. Negative for chest tightness.   Cardiovascular: Negative for chest pain and leg swelling.  Gastrointestinal: Negative for nausea, vomiting, abdominal pain and diarrhea.  Genitourinary: Negative for flank pain.  Musculoskeletal: Negative for back pain and neck stiffness.  Skin: Negative for rash.  Neurological: Negative for weakness, numbness and headaches.  Psychiatric/Behavioral: Negative for behavioral problems.    Allergies  Review of patient's allergies indicates no known allergies.  Home Medications   Current Outpatient Rx  Name  Route  Sig  Dispense  Refill  . acarbose (PRECOSE) 100 MG tablet   Oral   Take 100 mg by mouth 3 (three) times daily with meals.          Marland Kitchen albuterol (PROVENTIL HFA;VENTOLIN HFA) 108 (90 BASE) MCG/ACT inhaler   Inhalation   Inhale 2 puffs into the lungs at bedtime.          Marland Kitchen amLODipine (NORVASC) 10 MG tablet   Oral   Take 10 mg by mouth daily.          Marland Kitchen aspirin 81 MG chewable tablet  Oral   Chew 81 mg by mouth daily.          Marland Kitchen atorvastatin (LIPITOR) 10 MG tablet   Oral   Take 10 mg by mouth daily.         . clonazePAM (KLONOPIN) 0.5 MG tablet   Oral   Take 1 tablet (0.5 mg total) by mouth 2 (two) times daily as needed for anxiety.   20 tablet   0   . clopidogrel (PLAVIX) 75 MG tablet   Oral   Take 75 mg by mouth daily.          Marland Kitchen ezetimibe-simvastatin (VYTORIN) 10-20 MG per tablet   Oral   Take 1 tablet by mouth at bedtime.         . finasteride (PROSCAR) 5 MG tablet   Oral   Take 5 mg by mouth daily.          . fluticasone (FLONASE) 50 MCG/ACT nasal spray   Nasal   Place 1 spray into the nose daily as needed for rhinitis or allergies.          Marland Kitchen glipiZIDE (GLUCOTROL) 10 MG tablet   Oral   Take 10 mg by mouth 2 (two) times daily  before a meal.          . HYDROcodone-acetaminophen (VICODIN) 5-500 MG per tablet   Oral   Take 1 tablet by mouth every 6 (six) hours as needed. For pain         . levothyroxine (SYNTHROID, LEVOTHROID) 50 MCG tablet   Oral   Take 50 mcg by mouth daily before breakfast.          . lisinopril-hydrochlorothiazide (PRINZIDE,ZESTORETIC) 20-12.5 MG per tablet   Oral   Take 1 tablet by mouth 2 (two) times daily.          . metFORMIN (GLUCOPHAGE) 500 MG tablet   Oral   Take 2 tablets (1,000 mg total) by mouth 2 (two) times daily with a meal.         . Multiple Vitamin (MULITIVITAMIN WITH MINERALS) TABS   Oral   Take 1 tablet by mouth daily.         . furosemide (LASIX) 20 MG tablet   Oral   Take 1 tablet (20 mg total) by mouth daily.   6 tablet   0   . isosorbide mononitrate (IMDUR) 30 MG 24 hr tablet   Oral   Take 30 mg by mouth daily.          BP 140/58  Pulse 68  Temp(Src) 97.5 F (36.4 C) (Oral)  Resp 13  Wt 152 lb 9.6 oz (69.219 kg)  SpO2 100% Physical Exam  Nursing note and vitals reviewed. Constitutional: He is oriented to person, place, and time. He appears well-developed and well-nourished.  HENT:  Head: Normocephalic and atraumatic.  Eyes: EOM are normal. Pupils are equal, round, and reactive to light.  Neck: Normal range of motion. Neck supple.  Chronic tracheostomy  Cardiovascular: Normal rate, regular rhythm and normal heart sounds.   No murmur heard. Pulmonary/Chest: Effort normal and breath sounds normal.  Mildly harsh breath sounds.  Abdominal: Soft. Bowel sounds are normal. He exhibits no distension and no mass. There is no tenderness. There is no rebound and no guarding.  Musculoskeletal: Normal range of motion. He exhibits no edema.  Neurological: He is alert and oriented to person, place, and time. No cranial nerve deficit.  Skin: Skin is warm and dry.  Psychiatric: He has a normal mood and affect.    ED Course  Procedures  (including critical care time) Labs Review Labs Reviewed  CBC - Abnormal; Notable for the following:    RBC 3.74 (*)    Hemoglobin 11.5 (*)    HCT 33.5 (*)    All other components within normal limits  PRO B NATRIURETIC PEPTIDE - Abnormal; Notable for the following:    Pro B Natriuretic peptide (BNP) 983.2 (*)    All other components within normal limits  BASIC METABOLIC PANEL - Abnormal; Notable for the following:    Glucose, Bld 157 (*)    GFR calc non Af Amer 76 (*)    GFR calc Af Amer 88 (*)    All other components within normal limits  URINALYSIS, ROUTINE W REFLEX MICROSCOPIC  POCT I-STAT TROPONIN I   Imaging Review Dg Chest 2 View  05/17/2013   CLINICAL DATA:  Shortness of breath.  History COPD  EXAM: CHEST  2 VIEW  COMPARISON:  01/04/2013  FINDINGS: Stable cardiomegaly. Status post median sternotomy for CABG. Heavy atherosclerotic calcification of the thoracic aorta. Pulmonary vascularity is normal. The lungs appear emphysematous and are mildly hyperinflated. No airspace disease or pleural effusion. No acute osseous abnormality.  IMPRESSION: Stable chest radiograph. Emphysema/ COPD and stable mild cardiomegaly. No acute findings.   Electronically Signed   By: Britta Mccreedy M.D.   On: 05/17/2013 17:59    EKG Interpretation   None       MDM   1. Dyspnea    Patient with shortness of breath. EKG shows chronic left bundle branch block. X-ray is reassuring. BNP is mildly elevated. We'll give some Lasix and patient will followup with his PCP and cardiologist.    Juliet Rude. Rubin Payor, MD 05/17/13 2330

## 2013-05-19 DIAGNOSIS — M2040 Other hammer toe(s) (acquired), unspecified foot: Secondary | ICD-10-CM

## 2013-05-29 ENCOUNTER — Ambulatory Visit: Payer: Medicare Other | Admitting: Vascular Surgery

## 2013-05-29 ENCOUNTER — Other Ambulatory Visit (HOSPITAL_COMMUNITY): Payer: Medicare Other

## 2013-06-23 ENCOUNTER — Encounter (HOSPITAL_COMMUNITY): Payer: Self-pay | Admitting: Emergency Medicine

## 2013-06-23 ENCOUNTER — Emergency Department (HOSPITAL_COMMUNITY)
Admission: EM | Admit: 2013-06-23 | Discharge: 2013-06-23 | Disposition: A | Discharge status: Home / Self Care | Payer: Medicare Other | Attending: Emergency Medicine | Admitting: Emergency Medicine

## 2013-06-23 DIAGNOSIS — Z930 Tracheostomy status: Secondary | ICD-10-CM | POA: Insufficient documentation

## 2013-06-23 DIAGNOSIS — Z79899 Other long term (current) drug therapy: Secondary | ICD-10-CM | POA: Insufficient documentation

## 2013-06-23 DIAGNOSIS — Z95818 Presence of other cardiac implants and grafts: Secondary | ICD-10-CM | POA: Insufficient documentation

## 2013-06-23 DIAGNOSIS — E785 Hyperlipidemia, unspecified: Secondary | ICD-10-CM | POA: Insufficient documentation

## 2013-06-23 DIAGNOSIS — E119 Type 2 diabetes mellitus without complications: Secondary | ICD-10-CM | POA: Insufficient documentation

## 2013-06-23 DIAGNOSIS — Z87891 Personal history of nicotine dependence: Secondary | ICD-10-CM | POA: Insufficient documentation

## 2013-06-23 DIAGNOSIS — I10 Essential (primary) hypertension: Secondary | ICD-10-CM | POA: Insufficient documentation

## 2013-06-23 DIAGNOSIS — I2510 Atherosclerotic heart disease of native coronary artery without angina pectoris: Secondary | ICD-10-CM | POA: Insufficient documentation

## 2013-06-23 DIAGNOSIS — I252 Old myocardial infarction: Secondary | ICD-10-CM | POA: Insufficient documentation

## 2013-06-23 DIAGNOSIS — R040 Epistaxis: Secondary | ICD-10-CM | POA: Insufficient documentation

## 2013-06-23 DIAGNOSIS — Z862 Personal history of diseases of the blood and blood-forming organs and certain disorders involving the immune mechanism: Secondary | ICD-10-CM | POA: Insufficient documentation

## 2013-06-23 DIAGNOSIS — E039 Hypothyroidism, unspecified: Secondary | ICD-10-CM | POA: Insufficient documentation

## 2013-06-23 DIAGNOSIS — IMO0002 Reserved for concepts with insufficient information to code with codable children: Secondary | ICD-10-CM | POA: Insufficient documentation

## 2013-06-23 DIAGNOSIS — Z951 Presence of aortocoronary bypass graft: Secondary | ICD-10-CM | POA: Insufficient documentation

## 2013-06-23 DIAGNOSIS — Z85819 Personal history of malignant neoplasm of unspecified site of lip, oral cavity, and pharynx: Secondary | ICD-10-CM | POA: Insufficient documentation

## 2013-06-23 DIAGNOSIS — J339 Nasal polyp, unspecified: Secondary | ICD-10-CM | POA: Insufficient documentation

## 2013-06-23 DIAGNOSIS — Z860100 Personal history of colon polyps, unspecified: Secondary | ICD-10-CM | POA: Insufficient documentation

## 2013-06-23 DIAGNOSIS — Z7982 Long term (current) use of aspirin: Secondary | ICD-10-CM | POA: Insufficient documentation

## 2013-06-23 LAB — PROTIME-INR
INR: 0.85 (ref 0.00–1.49)
Prothrombin Time: 11.5 seconds — ABNORMAL LOW (ref 11.6–15.2)

## 2013-06-23 NOTE — Discharge Instructions (Signed)
Nosebleed  A nosebleed can be caused by many things, including:   Getting hit hard in the nose.   Infections.   Dry nose.   Colds.   Medicines.  Your doctor may do lab testing if you get nosebleeds a lot and the cause is not known.  HOME CARE    If your nose was packed with material, keep it there until your doctor takes it out. Put the pack back in your nose if the pack falls out.   Do not blow your nose for 12 hours after the nosebleed.   Sit up and bend forward if your nose starts bleeding again. Pinch the front half of your nose nonstop for 20 minutes.   Put petroleum jelly inside your nose every morning if you have a dry nose.   Use a humidifier to make the air less dry.   Do not take aspirin.   Try not to strain, lift, or bend at the waist for many days after the nosebleed.  GET HELP RIGHT AWAY IF:    Nosebleeds keep happening and are hard to stop or control.   You have bleeding or bruises that are not normal on other parts of the body.   You have a fever.   The nosebleeds get worse.   You get lightheaded, feel faint, sweaty, or throw up (vomit) blood.  MAKE SURE YOU:    Understand these instructions.   Will watch your condition.   Will get help right away if you are not doing well or get worse.  Document Released: 03/13/2008 Document Revised: 08/27/2011 Document Reviewed: 03/13/2008  [employer] Patient Information 2014 [employer].

## 2013-06-23 NOTE — ED Provider Notes (Signed)
CSN: [id number]     Arrival date & time 06/23/13  2002 History  This chart was scribed for non-physician practitioner, Etta Quill, NP-C working with Blanchard Kelch, MD by Frederich Balding, ED scribe. This patient was seen in room TR06C/TR06C and the patient's care was started at 9:25 PM.   Chief Complaint  Patient presents with  . Left nostril "polyp"    The history is provided by the patient. No language interpreter was used.   HPI Comments: Vale Mousseau is a 80 y.o. male who presents to the Emergency Department complaining of intermittent trouble breathing due to a polyp in his left nostril. He states he saw his ENT today and they are going to do to surgery to remove it soon. Pt states he started bleeding from the right side of his nose tonight but it stopped quickly. Denies fever, abdominal pain, nausea, emesis, headache, sore throat, difficulty breathing, difficulty swallowing.   Past Medical History  Diagnosis Date  . Coronary artery disease     s/p CABG 1996 then PCI of SVG to OM 2007  . Diverticulosis   . Dyslipidemia   . Renal artery stenosis     s/p bilateral stents  . Hiatal hernia   . Colon polyps   . Carotid artery stenosis   . Peripheral neuropathy   . Anemia   . Hypothyroidism   . Nasal polyps   . Hypertension   . Diabetes mellitus   . Throat cancer   . Myocardial infarction   . Peripheral vascular disease   . Acute myocardial infarction, unspecified site, initial episode of care   . Acute myocardial infarction of other lateral wall, initial episode of care   . GERD (gastroesophageal reflux disease)    Past Surgical History  Procedure Laterality Date  . Coronary artery bypass graft      1996  . Cardiac catheterization      PCI of SVG to OM 2007  . Renal artery stent    . Tracheostomy    . Appendectomy     Family History  Problem Relation Age of Onset  . Hypertension Mother   . Heart attack Brother   . Diabetes Son    History  Substance Use Topics   . Smoking status: Former Smoker -- 0.30 packs/day    Types: Cigarettes    Quit date: 06/18/1946  . Smokeless tobacco: Not on file  . Alcohol Use: No    Review of Systems  Constitutional: Negative for fever.  HENT: Positive for nosebleeds. Negative for sore throat and trouble swallowing.   Gastrointestinal: Negative for nausea, vomiting and abdominal pain.  Neurological: Negative for headaches.  All other systems reviewed and are negative.    Allergies  Review of patient's allergies indicates no known allergies.  Home Medications   Current Outpatient Rx  Name  Route  Sig  Dispense  Refill  . acarbose (PRECOSE) 100 MG tablet   Oral   Take 100 mg by mouth 3 (three) times daily with meals.          Marland Kitchen albuterol (PROVENTIL HFA;VENTOLIN HFA) 108 (90 BASE) MCG/ACT inhaler   Inhalation   Inhale 2 puffs into the lungs at bedtime.          Marland Kitchen amLODipine (NORVASC) 10 MG tablet   Oral   Take 10 mg by mouth daily.          Marland Kitchen aspirin 81 MG chewable tablet   Oral   Chew 81 mg by mouth  daily.          . atorvastatin (LIPITOR) 10 MG tablet   Oral   Take 10 mg by mouth daily.         . clonazePAM (KLONOPIN) 0.5 MG tablet   Oral   Take 1 tablet (0.5 mg total) by mouth 2 (two) times daily as needed for anxiety.   20 tablet   0   . clopidogrel (PLAVIX) 75 MG tablet   Oral   Take 75 mg by mouth daily.          Marland Kitchen ezetimibe-simvastatin (VYTORIN) 10-20 MG per tablet   Oral   Take 1 tablet by mouth at bedtime.         . finasteride (PROSCAR) 5 MG tablet   Oral   Take 5 mg by mouth daily.          . fluticasone (FLONASE) 50 MCG/ACT nasal spray   Nasal   Place 1 spray into the nose daily as needed for rhinitis or allergies.          . furosemide (LASIX) 20 MG tablet   Oral   Take 1 tablet (20 mg total) by mouth daily.   6 tablet   0   . glipiZIDE (GLUCOTROL) 10 MG tablet   Oral   Take 10 mg by mouth 2 (two) times daily before a meal.          .  HYDROcodone-acetaminophen (VICODIN) 5-500 MG per tablet   Oral   Take 1 tablet by mouth every 6 (six) hours as needed. For pain         . isosorbide mononitrate (IMDUR) 30 MG 24 hr tablet   Oral   Take 30 mg by mouth daily.         Marland Kitchen levothyroxine (SYNTHROID, LEVOTHROID) 50 MCG tablet   Oral   Take 50 mcg by mouth daily before breakfast.          . lisinopril-hydrochlorothiazide (PRINZIDE,ZESTORETIC) 20-12.5 MG per tablet   Oral   Take 1 tablet by mouth 2 (two) times daily.          . metFORMIN (GLUCOPHAGE) 500 MG tablet   Oral   Take 2 tablets (1,000 mg total) by mouth 2 (two) times daily with a meal.         . Multiple Vitamin (MULITIVITAMIN WITH MINERALS) TABS   Oral   Take 1 tablet by mouth daily.          BP 134/62  Pulse 96  Temp(Src) 98.7 F (37.1 C) (Oral)  Resp 17  Ht 6' (1.829 m)  Wt 153 lb 6 oz (69.57 kg)  BMI 20.80 kg/m2  SpO2 99%  Physical Exam  Nursing note and vitals reviewed. Constitutional: He is oriented to person, place, and time. He appears well-developed and well-nourished. No distress.  HENT:  Head: Normocephalic and atraumatic.  Polyp in left nare. Tracheostomy. No active bleeding out of right nare.   Eyes: EOM are normal.  Neck: Neck supple. No tracheal deviation present.  Cardiovascular: Normal rate, regular rhythm and normal heart sounds.   Pulmonary/Chest: Effort normal and breath sounds normal. No respiratory distress. He has no wheezes. He has no rales. He exhibits no tenderness.  Abdominal: Soft. There is no tenderness.  Musculoskeletal: Normal range of motion.  Neurological: He is alert and oriented to person, place, and time.  Skin: Skin is warm and dry.  Psychiatric: He has a normal mood and affect. His behavior is normal.  ED Course  Procedures (including critical care time)  DIAGNOSTIC STUDIES: Oxygen Saturation is 99% on RA, normal by my interpretation.    COORDINATION OF CARE: 9:27 PM-Discussed treatment  plan which includes lab work and ENT follow up with pt at bedside and pt agreed to plan.   Labs Review Labs Reviewed  PROTIME-INR - Abnormal; Notable for the following:    Prothrombin Time 11.5 (*)    All other components within normal limits   Imaging Review No results found.  EKG Interpretation   None      Patient discussed with and seen by Dr. Aline Brochure.   Brief episode of right nare epistaxis.  No active bleeding on exam.  Left nasal polyp--scheduled to see ENT.   MDM  Epistaxis. Left nasal polyp.   I personally performed the services described in this documentation, which was scribed in my presence. The recorded information has been reviewed and is accurate.  Norman Herrlich, NP 06/24/13 [id number]  Norman Herrlich, NP 06/24/13 [phone]

## 2013-06-23 NOTE — ED Notes (Signed)
Patient states he has trouble breathing sometime.  Saw MD today and they are going to remove a "knot" out of his left nostril.  Stated he had some bleeding from the right side of his nose.  States he coughs up blood sometimes.

## 2013-06-24 NOTE — ED Provider Notes (Signed)
Medical screening examination/treatment/procedure(s) were conducted as a shared visit with non-physician practitioner(s) and myself.  I personally evaluated the patient during the encounter.  EKG Interpretation   None       I interviewed and examined the patient. Lungs are CTAB. Cardiac exam wnl. Abdomen soft. No active bleeding on inspection of nares/nasal septum, normal appearing oropharynx. Pt is well appearing and had only brief nose bleeding. Will have him f/u w/ his ENT as scheduled.   Blanchard Kelch, MD 06/24/13 [phone]

## 2013-07-07 ENCOUNTER — Encounter (HOSPITAL_COMMUNITY): Payer: Self-pay

## 2013-07-07 ENCOUNTER — Encounter (HOSPITAL_COMMUNITY): Payer: Self-pay | Admitting: Pharmacy Technician

## 2013-07-07 ENCOUNTER — Encounter (HOSPITAL_COMMUNITY)
Admission: RE | Admit: 2013-07-07 | Discharge: 2013-07-07 | Disposition: A | Discharge status: Home / Self Care | Payer: Medicare Other | Source: Ambulatory Visit | Attending: Otolaryngology | Admitting: Otolaryngology

## 2013-07-07 DIAGNOSIS — Z01812 Encounter for preprocedural laboratory examination: Secondary | ICD-10-CM | POA: Insufficient documentation

## 2013-07-07 DIAGNOSIS — Z01818 Encounter for other preprocedural examination: Secondary | ICD-10-CM | POA: Insufficient documentation

## 2013-07-07 HISTORY — DX: Shortness of breath: R06.02

## 2013-07-07 LAB — CBC
HEMATOCRIT: 34.1 % — AB (ref 39.0–52.0)
Hemoglobin: 11.5 g/dL — ABNORMAL LOW (ref 13.0–17.0)
MCH: 30.9 pg (ref 26.0–34.0)
MCHC: 33.7 g/dL (ref 30.0–36.0)
MCV: 91.7 fL (ref 78.0–100.0)
Platelets: 371 10*3/uL (ref 150–400)
RBC: 3.72 MIL/uL — ABNORMAL LOW (ref 4.22–5.81)
RDW: 14.6 % (ref 11.5–15.5)
WBC: 6.4 10*3/uL (ref 4.0–10.5)

## 2013-07-07 LAB — BASIC METABOLIC PANEL
BUN: 22 mg/dL (ref 6–23)
CALCIUM: 9 mg/dL (ref 8.4–10.5)
CHLORIDE: 100 meq/L (ref 96–112)
CO2: 27 mEq/L (ref 19–32)
CREATININE: 0.86 mg/dL (ref 0.50–1.35)
GFR calc non Af Amer: 75 mL/min — ABNORMAL LOW (ref >90)
GFR, EST AFRICAN AMERICAN: 87 mL/min — AB (ref >90)
Glucose, Bld: 206 mg/dL — ABNORMAL HIGH (ref 70–99)
Potassium: 4.3 mEq/L (ref 3.7–5.3)
Sodium: 141 mEq/L (ref 137–147)

## 2013-07-07 NOTE — Pre-Procedure Instructions (Signed)
[name]  07/07/2013   Your procedure is scheduled on: Friday, July 10, 2013 @ 10:30 AM  Report to [facility] Short Stay (use Main Entrance "A'') at 8:30 AM.  Call this number if you have problems the morning of surgery: [phone]   Remember:   Do not eat food or drink liquids after midnight.   Take these medicines the morning of surgery with A SIP OF WATER: amLODipine (NORVASC) 10 MG tablet, finasteride (PROSCAR) 5 MG tablet, isosorbide mononitrate (IMDUR) 30 MG 24 hr tablet,  levothyroxine (SYNTHROID, LEVOTHROID) 50 MCG tablet, if needed: fluticasone (FLONASE) 50 MCG/ACT nasal spray for rhinitis or allergies.  Do NOT take any diabetic medications the morning of surgery Stop taking Aspirin, Plavix,  and herbal medications. Do not take any NSAIDs ie: Ibuprofen, Advil, Naproxen or any medication containing Aspirin.  Do not wear jewelry, make-up or nail polish.  Do not wear lotions, powders, or perfumes. You may wear deodorant.  Do not shave 48 hours prior to surgery. Men may shave face and neck.  Do not bring valuables to the hospital.  [facility] is not responsible for any belongings or valuables.               Contacts, dentures or bridgework may not be worn into surgery.  Leave suitcase in the car. After surgery it may be brought to your room.  For patients admitted to the hospital, discharge time is determined by your treatment team.               Patients discharged the day of surgery will not be allowed to drive home.  Name and phone number of your driver:   Special Instructions: Shower using CHG 2 nights before surgery and the night before surgery.  If you shower the day of surgery use CHG.  Use special wash - you have one bottle of CHG for all showers.  You should use approximately 1/3 of the bottle for each shower.   Please read over the following fact sheets that you were given: Pain Booklet and Surgical Site Infection Prevention

## 2013-07-07 NOTE — Progress Notes (Signed)
Spoke with Amy at Dr. Noreene Filbert office to make MD aware that pt did not stop Plavix as instructed ( pt took last dose Monday 07/06/13). Amy to follow up with MD. Pt chart forwarded to [place], [place], ( anesthesia) regarding pt cardiac history, EKG, CXR and pt having a trach.

## 2013-07-08 ENCOUNTER — Encounter (HOSPITAL_COMMUNITY): Payer: Self-pay | Admitting: Vascular Surgery

## 2013-07-08 NOTE — Progress Notes (Signed)
Anesthesia chart review: Patient is an 80 year old male scheduled for left endoscopic gastrotomy ethmoidectomy frontal recess on 02/15/50 by Dr. Erik Obey.  Procedure was initially scheduled for 07/10/13, but I think was rescheduled due to him no holding his Plavix.   History includes former smoker, CAD s/p CABG '97, PCI to  SVG - OM graft '07 and with DES 11/07/12 following NSTEMI, PAD, HTN, anemia, hypothyroidism, DM2, GERD, renal artery stenosis s/p bilateral stents, moderate carotid artery stenosis '13, throat cancer s/p tracheostomy, hiatal hernia, dyslipidemia. Previous notes list his PCP as Dr. Lajean Manes. Cardiologist is Dr. Casandra Doffing.  Cardiac cath on 11/07/12  showed: 1. Severe native three-vessel coronary disease. 2. Culprit vessel is 99% stenosis in SVG to OM. This was successfully stented with a 3.5 x 18 expedition drug-eluting stent using a distal embolic protection device. 3. The patient did have transient no reflow, which was successfully treated with intracoronary infusion of adenosine. 4. Patent sequential SVG to diagonal/LAD. 5. Patent sequential SVG to PDA/OM.   EKG on 05/17/13 showed SR with occasional PVC's, left BBB.  It was not felt significantly changed from prior tracing.   Carotid duplex on 10/19/11 showed: 1. 40%-59% right internal carotid artery stenosis.  2. 40%-59% left internal carotid artery stenosis probable end of range, unable to obtain high velocities as obtained on prior studies due to heavy plaque.  3. Left external carotid artery stenosis.  4. Note: Calcific plaque may obscure higher velocities.   CXR on 05/17/13 showed: Stable chest radiograph. Emphysema/ COPD and stable mild cardiomegaly. No acute findings.  Preoperative labs noted.   Patient with CAD history s/p DES < 1 year ago. I called and spoke with Amy at [facility] to see if patient had been cleared by Dr. Irish Lack.  She faxed me a note stating that Dr. Mare Loan had spoken with Dr. Beau Fanny and "would  prefer the patient be on Plavix for one full year, but agreed to stopping temporarily for the surgery. No need for Lovenox or similar."    If no acute changes then I would anticipate that he could proceed as planned.  George Hugh [facility] Short Stay Center/Anesthesiology Phone [phone] 07/09/2013 1:07 PM

## 2013-07-13 ENCOUNTER — Telehealth: Payer: Self-pay | Admitting: Interventional Cardiology

## 2013-07-13 NOTE — Telephone Encounter (Signed)
Patient wants [place] to call in Rx for his Amlodipine besylate 10mg  to [facility] in [place]: [phone].  He said that the pharmacy has been calling but has not received a reply.

## 2013-07-13 NOTE — Telephone Encounter (Signed)
Spoke with pts wife and told her for pt to call Dr. Carlyle Lipa office to see what he should do regarding amlodipine. I can not refill this medication since it was stopped in 9/14 by Dr Felipa Eth.

## 2013-07-13 NOTE — Telephone Encounter (Signed)
Dr. Felipa Eth stopped Amlodipine 9/14. Pt should not be taking this medication.

## 2013-07-16 ENCOUNTER — Ambulatory Visit (HOSPITAL_COMMUNITY): Admission: RE | Admit: 2013-07-16 | Payer: Medicare Other | Source: Ambulatory Visit | Admitting: Otolaryngology

## 2013-07-16 ENCOUNTER — Encounter (HOSPITAL_COMMUNITY): Admission: RE | Payer: Self-pay | Source: Ambulatory Visit

## 2013-07-16 SURGERY — SINUS SURGERY, ENDOSCOPIC
Anesthesia: General | Laterality: Left

## 2013-07-20 DIAGNOSIS — L84 Corns and callosities: Secondary | ICD-10-CM

## 2013-08-20 ENCOUNTER — Encounter: Payer: Self-pay | Admitting: Interventional Cardiology

## 2013-08-20 ENCOUNTER — Ambulatory Visit (INDEPENDENT_AMBULATORY_CARE_PROVIDER_SITE_OTHER): Payer: Medicare Other | Admitting: Interventional Cardiology

## 2013-08-20 VITALS — BP 98/67 | HR 89 | Ht 72.0 in | Wt 149.0 lb

## 2013-08-20 DIAGNOSIS — I6529 Occlusion and stenosis of unspecified carotid artery: Secondary | ICD-10-CM

## 2013-08-20 DIAGNOSIS — I10 Essential (primary) hypertension: Secondary | ICD-10-CM

## 2013-08-20 DIAGNOSIS — I252 Old myocardial infarction: Secondary | ICD-10-CM | POA: Insufficient documentation

## 2013-08-20 DIAGNOSIS — E782 Mixed hyperlipidemia: Secondary | ICD-10-CM | POA: Insufficient documentation

## 2013-08-20 MED ORDER — ASPIRIN 81 MG PO TBEC: 81.0000 mg | DELAYED_RELEASE_TABLET | Freq: Every day | ORAL | Status: DC

## 2013-08-20 NOTE — Patient Instructions (Signed)
Your physician recommends that you continue on your current medications as directed. Please refer to the Current Medication list given to you today.  Your physician wants you to follow-up in: 6 months with Dr. Varanasi.  You will receive a reminder letter in the mail two months in advance. If you don't receive a letter, please call our office to schedule the follow-up appointment.  

## 2013-08-20 NOTE — Progress Notes (Signed)
Patient ID: [name], male   DOB: 04/21/1923, 80 y.o.   MRN: [id number]    [address], [place], [place]  [address] Phone: [phone] Fax:  [phone]  Date:  08/20/2013   ID:  [name], DOB [birth year], MRN [id number]  PCP:  [name]., MD      History of Present Illness: [name] is a 80 y.o. male with CAD who most recently had a NSTEMI in 8/14. He had a stent to the SVG to OM. He has returned to walking.  He has been stressed due to the fact that his wife fell and has been hospitalized for a week or so after a fall. CAD/ASCVD:  Denies : Chest pain.  Diaphoresis.  Dizziness.  Dyspnea on exertion.  Fatigue.  Leg edema.  Nitroglycerin.  Orthopnea.  Palpitations.  Syncope.     Wt Readings from Last 3 Encounters:  08/20/13 149 lb (67.586 kg)  07/07/13 152 lb 8 oz (69.174 kg)  06/23/13 153 lb 6 oz (69.57 kg)     Past Medical History  Diagnosis Date  . Coronary artery disease     s/p CABG 1996 then PCI of SVG to OM 2007  . Diverticulosis   . Dyslipidemia   . Renal artery stenosis     s/p bilateral stents  . Hiatal hernia   . Colon polyps   . Carotid artery stenosis   . Peripheral neuropathy   . Anemia   . Hypothyroidism   . Nasal polyps   . Hypertension   . Diabetes mellitus   . Throat cancer   . Myocardial infarction   . Peripheral vascular disease   . Acute myocardial infarction, unspecified site, initial episode of care   . Acute myocardial infarction of other lateral wall, initial episode of care   . GERD (gastroesophageal reflux disease)   . Shortness of breath     Hx: of    Current Outpatient Prescriptions  Medication Sig Dispense Refill  . acarbose (PRECOSE) 100 MG tablet Take 100 mg by mouth 3 (three) times daily with meals.       Marland Kitchen albuterol (PROVENTIL HFA;VENTOLIN HFA) 108 (90 BASE) MCG/ACT inhaler Inhale 2 puffs into the lungs at bedtime.       Marland Kitchen amLODipine (NORVASC) 10 MG tablet Take 10 mg by mouth daily.        Marland Kitchen aspirin 325 MG tablet Take 325 mg by mouth daily as needed for headache.      Marland Kitchen atorvastatin (LIPITOR) 10 MG tablet Take 10 mg by mouth daily.      . cephALEXin (KEFLEX) 500 MG capsule Take 500 mg by mouth 4 (four) times daily.      . clopidogrel (PLAVIX) 75 MG tablet Take 75 mg by mouth daily with breakfast.      . ferrous sulfate 325 (65 FE) MG tablet Take 325 mg by mouth daily with breakfast.      . finasteride (PROSCAR) 5 MG tablet Take 5 mg by mouth daily.       . fluticasone (FLONASE) 50 MCG/ACT nasal spray Place 1 spray into the nose daily as needed for rhinitis or allergies.       Marland Kitchen glipiZIDE (GLUCOTROL) 10 MG tablet Take 10 mg by mouth 2 (two) times daily before a meal.       . isosorbide mononitrate (IMDUR) 30 MG 24 hr tablet Take 30 mg by mouth daily.      Marland Kitchen levothyroxine (SYNTHROID, LEVOTHROID) 50 MCG  tablet Take 50 mcg by mouth daily before breakfast.       . lisinopril-hydrochlorothiazide (PRINZIDE,ZESTORETIC) 20-12.5 MG per tablet Take 1 tablet by mouth 2 (two) times daily.       . metFORMIN (GLUCOPHAGE) 500 MG tablet Take 2 tablets (1,000 mg total) by mouth 2 (two) times daily with a meal.      . [DISCONTINUED] carvedilol (COREG) 25 MG tablet Take 25 mg by mouth 2 (two) times daily with a meal.       . [DISCONTINUED] enalapril (VASOTEC) 20 MG tablet Take 20 mg by mouth daily.       . [DISCONTINUED] glimepiride (AMARYL) 4 MG tablet Take 4 mg by mouth 2 (two) times daily before a meal.        . [DISCONTINUED] simvastatin (ZOCOR) 40 MG tablet Take 40 mg by mouth daily.         No current facility-administered medications for this visit.    Allergies:   No Known Allergies  Social History:  The patient  reports that he quit smoking about 67 years ago. His smoking use included Cigarettes. He smoked 0.30 packs per day. He does not have any smokeless tobacco history on file. He reports that he does not drink alcohol or use illicit drugs.   Family History:  The patient's family  history includes Diabetes in his son; Heart attack in his brother; Hypertension in his mother.   ROS:  Please see the history of present illness.  No nausea, vomiting.  No fevers, chills.  No focal weakness.  No dysuria.    All other systems reviewed and negative.   PHYSICAL EXAM: VS:  BP 98/67  Pulse 89  Ht 6' (1.829 m)  Wt 149 lb (67.586 kg)  BMI 20.20 kg/m2 Well nourished, well developed, in no acute distress HEENT: tracheostomy Neck: no JVD, bilateral carotid bruits Cardiac:  normal S1, S2; RRR;  Lungs:  clear to auscultation bilaterally, no wheezing, rhonchi or rales Abd: soft, nontender, no hepatomegaly Ext: no edema Skin: warm and dry Neuro:   no focal abnormalities noted      ASSESSMENT AND PLAN:  Coronary atherosclerosis of native coronary artery  Continue Aspirin Tablet Chewable, 81 MG, 1 tablet, Orally, Once a day Continue Plavix Tablet, 75 MG, 1 tablet, Orally, Once a day Notes: s/p CABG. Cath report reviewed. continue dual antiplatelet therapy for at least a year without interruption. No exertional sx.  No CHF sx; he has had 2 MI in the past 7 years.  PCI of SVG in 2007 ( no reflow), then same graft in 2014.   2. Hypertension, essential  Continue Amlodipine Besylate Tablet, 10 MG, 1 tablet, Orally, Once a day Continue Lisinopril-Hydrochlorothiazide Tablet, 20-12.5 MG, 2 tablets, Orally, Once a day  BP borderline today.  No sx of lightheadedness.   If he gets lightheadedness, would decrease amlodipine.   3. Hyperlipidemia: LDL 81, HDL 42 in 9/14.  Continue atorvastatin.  4. Carotid artery disease: Bilateral moderate disease; follow with carotid duplex. Preventive Medicine  Adult topics discussed:  Exercise: 5 days a week, at least 30 minutes of aerobic exercise.      Signed, Mina Marble, MD, [facility] 08/20/2013 9:00 AM

## 2013-10-05 DIAGNOSIS — L84 Corns and callosities: Secondary | ICD-10-CM

## 2013-10-13 ENCOUNTER — Other Ambulatory Visit: Payer: Self-pay | Admitting: Cardiology

## 2013-10-13 MED ORDER — LISINOPRIL-HYDROCHLOROTHIAZIDE 20-12.5 MG PO TABS: 1.0000 | ORAL_TABLET | Freq: Two times a day (BID) | ORAL | Status: DC

## 2013-10-13 NOTE — Telephone Encounter (Signed)
Refilled

## 2013-11-05 DIAGNOSIS — L84 Corns and callosities: Secondary | ICD-10-CM

## 2013-11-30 DIAGNOSIS — L84 Corns and callosities: Secondary | ICD-10-CM

## 2013-12-14 ENCOUNTER — Encounter: Payer: Self-pay | Admitting: Podiatry

## 2013-12-15 DIAGNOSIS — L84 Corns and callosities: Secondary | ICD-10-CM

## 2013-12-17 ENCOUNTER — Ambulatory Visit (INDEPENDENT_AMBULATORY_CARE_PROVIDER_SITE_OTHER): Payer: Medicare Other | Admitting: Podiatry

## 2013-12-17 ENCOUNTER — Encounter: Payer: Self-pay | Admitting: Podiatry

## 2013-12-17 DIAGNOSIS — E1149 Type 2 diabetes mellitus with other diabetic neurological complication: Secondary | ICD-10-CM

## 2013-12-17 DIAGNOSIS — Q828 Other specified congenital malformations of skin: Secondary | ICD-10-CM

## 2013-12-17 NOTE — Patient Instructions (Signed)
Diabetes and Foot Care Diabetes may cause you to have problems because of poor blood supply (circulation) to your feet and legs. This may cause the skin on your feet to become thinner, break easier, and heal more slowly. Your skin may become dry, and the skin may peel and crack. You may also have nerve damage in your legs and feet causing decreased feeling in them. You may not notice minor injuries to your feet that could lead to infections or more serious problems. Taking care of your feet is one of the most important things you can do for yourself.  HOME CARE INSTRUCTIONS  Wear shoes at all times, even in the house. Do not go barefoot. Bare feet are easily injured.  Check your feet daily for blisters, cuts, and redness. If you cannot see the bottom of your feet, use a mirror or ask someone for help.  Wash your feet with warm water (do not use hot water) and mild soap. Then pat your feet and the areas between your toes until they are completely dry. Do not soak your feet as this can dry your skin.  Apply a moisturizing lotion or petroleum jelly (that does not contain alcohol and is unscented) to the skin on your feet and to dry, brittle toenails. Do not apply lotion between your toes.  Trim your toenails straight across. Do not dig under them or around the cuticle. File the edges of your nails with an emery board or nail file.  Do not cut corns or calluses or try to remove them with medicine.  Wear clean socks or stockings every day. Make sure they are not too tight. Do not wear knee-high stockings since they may decrease blood flow to your legs.  Wear shoes that fit properly and have enough cushioning. To break in new shoes, wear them for just a few hours a day. This prevents you from injuring your feet. Always look in your shoes before you put them on to be sure there are no objects inside.  Do not cross your legs. This may decrease the blood flow to your feet.  If you find a minor scrape,  cut, or break in the skin on your feet, keep it and the skin around it clean and dry. These areas may be cleansed with mild soap and water. Do not cleanse the area with peroxide, alcohol, or iodine.  When you remove an adhesive bandage, be sure not to damage the skin around it.  If you have a wound, look at it several times a day to make sure it is healing.  Do not use heating pads or hot water bottles. They may burn your skin. If you have lost feeling in your feet or legs, you may not know it is happening until it is too late.  Make sure your health care provider performs a complete foot exam at least annually or more often if you have foot problems. Report any cuts, sores, or bruises to your health care provider immediately. SEEK MEDICAL CARE IF:   You have an injury that is not healing.  You have cuts or breaks in the skin.  You have an ingrown nail.  You notice redness on your legs or feet.  You feel burning or tingling in your legs or feet.  You have pain or cramps in your legs and feet.  Your legs or feet are numb.  Your feet always feel cold. SEEK IMMEDIATE MEDICAL CARE IF:   There is increasing redness,   swelling, or pain in or around a wound.  There is a red line that goes up your leg.  Pus is coming from a wound.  You develop a fever or as directed by your health care provider.  You notice a bad smell coming from an ulcer or wound. Document Released: 06/01/2000 Document Revised: 02/04/2013 Document Reviewed: 11/11/2012 Exit[employer] Patient Information 2015 [employer]. This information is not intended to replace advice given to you by your health care provider. Make sure you discuss any questions you have with your health care provider.  

## 2013-12-19 NOTE — Progress Notes (Signed)
Subjective:     Patient ID: [name], male   DOB: [birth year], 80 y.o.   MRN: [id number]  HPI patient is in at risk diabetic with poor health and  has lesion plantar left that he cannot take care of   Review of Systems     Objective:   Physical Exam Neurovascular status diminished with no change and patient is able to converse with me. Lesion present plantar left foot    Assessment:     At risk diabetic with lesion plantar left that is thick and painful    Plan:     Debris the lesion and discussed diabetic shoes to try to increase cushioning and we will try to get approval for him as I do think this can help to prevent ulceration

## 2014-01-07 DIAGNOSIS — L84 Corns and callosities: Secondary | ICD-10-CM

## 2014-01-16 DIAGNOSIS — I4891 Unspecified atrial fibrillation: Secondary | ICD-10-CM

## 2014-01-16 DIAGNOSIS — Z7901 Long term (current) use of anticoagulants: Secondary | ICD-10-CM

## 2014-01-16 DIAGNOSIS — I350 Nonrheumatic aortic (valve) stenosis: Secondary | ICD-10-CM

## 2014-01-16 DIAGNOSIS — I4892 Unspecified atrial flutter: Secondary | ICD-10-CM

## 2014-01-16 DIAGNOSIS — I255 Ischemic cardiomyopathy: Secondary | ICD-10-CM

## 2014-01-16 HISTORY — DX: Nonrheumatic aortic (valve) stenosis: I35.0

## 2014-01-16 HISTORY — DX: Ischemic cardiomyopathy: I25.5

## 2014-01-16 HISTORY — DX: Unspecified atrial fibrillation: I48.91

## 2014-01-16 HISTORY — DX: Unspecified atrial flutter: I48.92

## 2014-01-16 HISTORY — DX: Long term (current) use of anticoagulants: Z79.01

## 2014-01-18 ENCOUNTER — Ambulatory Visit (INDEPENDENT_AMBULATORY_CARE_PROVIDER_SITE_OTHER): Payer: Medicare Other | Admitting: *Deleted

## 2014-01-18 DIAGNOSIS — E1149 Type 2 diabetes mellitus with other diabetic neurological complication: Secondary | ICD-10-CM

## 2014-01-18 NOTE — Progress Notes (Signed)
Measured for diabetic shoes and insoles. 

## 2014-01-18 NOTE — Patient Instructions (Signed)
Our office will notify you once your diabetic shoes and insoles arrive. At that time an appointment will be needed to pick them up.  

## 2014-01-27 DIAGNOSIS — L84 Corns and callosities: Secondary | ICD-10-CM

## 2014-02-07 ENCOUNTER — Encounter (HOSPITAL_COMMUNITY): Payer: Self-pay | Admitting: Emergency Medicine

## 2014-02-07 ENCOUNTER — Observation Stay (HOSPITAL_COMMUNITY)
Admission: EM | Admit: 2014-02-07 | Discharge: 2014-02-09 | Disposition: A | Discharge status: Home / Self Care | Payer: Medicare Other | Attending: Internal Medicine | Admitting: Internal Medicine

## 2014-02-07 ENCOUNTER — Emergency Department (HOSPITAL_COMMUNITY): Payer: Medicare Other

## 2014-02-07 DIAGNOSIS — D649 Anemia, unspecified: Secondary | ICD-10-CM | POA: Diagnosis not present

## 2014-02-07 DIAGNOSIS — I10 Essential (primary) hypertension: Secondary | ICD-10-CM | POA: Diagnosis not present

## 2014-02-07 DIAGNOSIS — E785 Hyperlipidemia, unspecified: Secondary | ICD-10-CM | POA: Insufficient documentation

## 2014-02-07 DIAGNOSIS — Z7982 Long term (current) use of aspirin: Secondary | ICD-10-CM | POA: Diagnosis not present

## 2014-02-07 DIAGNOSIS — G609 Hereditary and idiopathic neuropathy, unspecified: Secondary | ICD-10-CM | POA: Diagnosis not present

## 2014-02-07 DIAGNOSIS — Z79899 Other long term (current) drug therapy: Secondary | ICD-10-CM | POA: Diagnosis not present

## 2014-02-07 DIAGNOSIS — E119 Type 2 diabetes mellitus without complications: Secondary | ICD-10-CM | POA: Diagnosis not present

## 2014-02-07 DIAGNOSIS — R42 Dizziness and giddiness: Secondary | ICD-10-CM | POA: Insufficient documentation

## 2014-02-07 DIAGNOSIS — E038 Other specified hypothyroidism: Secondary | ICD-10-CM

## 2014-02-07 DIAGNOSIS — I739 Peripheral vascular disease, unspecified: Secondary | ICD-10-CM | POA: Diagnosis not present

## 2014-02-07 DIAGNOSIS — R7989 Other specified abnormal findings of blood chemistry: Secondary | ICD-10-CM

## 2014-02-07 DIAGNOSIS — I701 Atherosclerosis of renal artery: Secondary | ICD-10-CM | POA: Diagnosis not present

## 2014-02-07 DIAGNOSIS — Z951 Presence of aortocoronary bypass graft: Secondary | ICD-10-CM | POA: Insufficient documentation

## 2014-02-07 DIAGNOSIS — Z7902 Long term (current) use of antithrombotics/antiplatelets: Secondary | ICD-10-CM | POA: Insufficient documentation

## 2014-02-07 DIAGNOSIS — Z87891 Personal history of nicotine dependence: Secondary | ICD-10-CM | POA: Insufficient documentation

## 2014-02-07 DIAGNOSIS — Z792 Long term (current) use of antibiotics: Secondary | ICD-10-CM | POA: Diagnosis not present

## 2014-02-07 DIAGNOSIS — I252 Old myocardial infarction: Secondary | ICD-10-CM | POA: Insufficient documentation

## 2014-02-07 DIAGNOSIS — K5730 Diverticulosis of large intestine without perforation or abscess without bleeding: Secondary | ICD-10-CM | POA: Insufficient documentation

## 2014-02-07 DIAGNOSIS — I9589 Other hypotension: Secondary | ICD-10-CM | POA: Diagnosis not present

## 2014-02-07 DIAGNOSIS — Z860100 Personal history of colon polyps, unspecified: Secondary | ICD-10-CM | POA: Insufficient documentation

## 2014-02-07 DIAGNOSIS — E039 Hypothyroidism, unspecified: Secondary | ICD-10-CM | POA: Insufficient documentation

## 2014-02-07 DIAGNOSIS — K219 Gastro-esophageal reflux disease without esophagitis: Secondary | ICD-10-CM | POA: Diagnosis not present

## 2014-02-07 DIAGNOSIS — I6529 Occlusion and stenosis of unspecified carotid artery: Secondary | ICD-10-CM | POA: Insufficient documentation

## 2014-02-07 DIAGNOSIS — IMO0002 Reserved for concepts with insufficient information to code with codable children: Secondary | ICD-10-CM | POA: Insufficient documentation

## 2014-02-07 DIAGNOSIS — E860 Dehydration: Secondary | ICD-10-CM | POA: Diagnosis present

## 2014-02-07 DIAGNOSIS — K449 Diaphragmatic hernia without obstruction or gangrene: Secondary | ICD-10-CM | POA: Insufficient documentation

## 2014-02-07 DIAGNOSIS — I219 Acute myocardial infarction, unspecified: Secondary | ICD-10-CM

## 2014-02-07 DIAGNOSIS — I959 Hypotension, unspecified: Secondary | ICD-10-CM | POA: Diagnosis present

## 2014-02-07 DIAGNOSIS — I5023 Acute on chronic systolic (congestive) heart failure: Secondary | ICD-10-CM | POA: Diagnosis not present

## 2014-02-07 DIAGNOSIS — Z85819 Personal history of malignant neoplasm of unspecified site of lip, oral cavity, and pharynx: Secondary | ICD-10-CM | POA: Diagnosis not present

## 2014-02-07 DIAGNOSIS — I2510 Atherosclerotic heart disease of native coronary artery without angina pectoris: Secondary | ICD-10-CM | POA: Diagnosis not present

## 2014-02-07 DIAGNOSIS — I4891 Unspecified atrial fibrillation: Secondary | ICD-10-CM | POA: Diagnosis not present

## 2014-02-07 HISTORY — DX: Unspecified atrial fibrillation: I48.91

## 2014-02-07 HISTORY — DX: Unspecified atrial flutter: I48.92

## 2014-02-07 HISTORY — DX: Unspecified hearing loss, unspecified ear: H91.90

## 2014-02-07 LAB — CBC
HEMATOCRIT: 34.2 % — AB (ref 39.0–52.0)
HEMATOCRIT: 32.9 % — AB (ref 39.0–52.0)
HEMOGLOBIN: 10.9 g/dL — AB (ref 13.0–17.0)
Hemoglobin: 11.9 g/dL — ABNORMAL LOW (ref 13.0–17.0)
MCH: 32.2 pg (ref 26.0–34.0)
MCH: 30.8 pg (ref 26.0–34.0)
MCHC: 34.8 g/dL (ref 30.0–36.0)
MCHC: 33.1 g/dL (ref 30.0–36.0)
MCV: 92.4 fL (ref 78.0–100.0)
MCV: 92.9 fL (ref 78.0–100.0)
Platelets: 258 10*3/uL (ref 150–400)
Platelets: 230 10*3/uL (ref 150–400)
RBC: 3.7 MIL/uL — ABNORMAL LOW (ref 4.22–5.81)
RBC: 3.54 MIL/uL — ABNORMAL LOW (ref 4.22–5.81)
RDW: 13.8 % (ref 11.5–15.5)
RDW: 13.7 % (ref 11.5–15.5)
WBC: 6.6 10*3/uL (ref 4.0–10.5)
WBC: 5.7 10*3/uL (ref 4.0–10.5)

## 2014-02-07 LAB — COMPREHENSIVE METABOLIC PANEL
ALBUMIN: 3.3 g/dL — AB (ref 3.5–5.2)
ALK PHOS: 51 U/L (ref 39–117)
ALT: 8 U/L (ref 0–53)
AST: 14 U/L (ref 0–37)
Anion gap: 13 (ref 5–15)
BILIRUBIN TOTAL: 0.5 mg/dL (ref 0.3–1.2)
BUN: 24 mg/dL — ABNORMAL HIGH (ref 6–23)
CHLORIDE: 102 meq/L (ref 96–112)
CO2: 23 mEq/L (ref 19–32)
Calcium: 8.7 mg/dL (ref 8.4–10.5)
Creatinine, Ser: 1.11 mg/dL (ref 0.50–1.35)
GFR calc Af Amer: 66 mL/min — ABNORMAL LOW (ref >90)
GFR calc non Af Amer: 57 mL/min — ABNORMAL LOW (ref >90)
Glucose, Bld: 146 mg/dL — ABNORMAL HIGH (ref 70–99)
POTASSIUM: 4.3 meq/L (ref 3.7–5.3)
SODIUM: 138 meq/L (ref 137–147)
Total Protein: 6 g/dL (ref 6.0–8.3)

## 2014-02-07 LAB — HEPATIC FUNCTION PANEL
ALK PHOS: 49 U/L (ref 39–117)
ALT: 8 U/L (ref 0–53)
AST: 13 U/L (ref 0–37)
Albumin: 3.1 g/dL — ABNORMAL LOW (ref 3.5–5.2)
BILIRUBIN TOTAL: 0.5 mg/dL (ref 0.3–1.2)
Total Protein: 5.5 g/dL — ABNORMAL LOW (ref 6.0–8.3)

## 2014-02-07 LAB — HEPARIN LEVEL (UNFRACTIONATED): HEPARIN UNFRACTIONATED: 0.22 [IU]/mL — AB (ref 0.30–0.70)

## 2014-02-07 LAB — CREATININE, SERUM
CREATININE: 0.97 mg/dL (ref 0.50–1.35)
GFR calc Af Amer: 83 mL/min — ABNORMAL LOW (ref >90)
GFR, EST NON AFRICAN AMERICAN: 72 mL/min — AB (ref >90)

## 2014-02-07 LAB — CBG MONITORING, ED
GLUCOSE-CAPILLARY: 104 mg/dL — AB (ref 70–99)
Glucose-Capillary: 120 mg/dL — ABNORMAL HIGH (ref 70–99)

## 2014-02-07 LAB — TROPONIN I
Troponin I: 0.34 ng/mL (ref <0.30)
Troponin I: 0.3 ng/mL (ref <0.30)

## 2014-02-07 LAB — URINALYSIS, ROUTINE W REFLEX MICROSCOPIC
Bilirubin Urine: NEGATIVE
GLUCOSE, UA: NEGATIVE mg/dL
Hgb urine dipstick: NEGATIVE
Ketones, ur: NEGATIVE mg/dL
LEUKOCYTES UA: NEGATIVE
Nitrite: NEGATIVE
PH: 6 (ref 5.0–8.0)
Protein, ur: NEGATIVE mg/dL
SPECIFIC GRAVITY, URINE: 1.012 (ref 1.005–1.030)
Urobilinogen, UA: 0.2 mg/dL (ref 0.0–1.0)

## 2014-02-07 LAB — I-STAT TROPONIN, ED: Troponin i, poc: 0.24 ng/mL (ref 0.00–0.08)

## 2014-02-07 LAB — GLUCOSE, CAPILLARY: Glucose-Capillary: 166 mg/dL — ABNORMAL HIGH (ref 70–99)

## 2014-02-07 LAB — TSH: TSH: 8.24 u[IU]/mL — ABNORMAL HIGH (ref 0.350–4.500)

## 2014-02-07 LAB — I-STAT CG4 LACTIC ACID, ED: Lactic Acid, Venous: 2.18 mmol/L (ref 0.5–2.2)

## 2014-02-07 LAB — MAGNESIUM: Magnesium: 1.3 mg/dL — ABNORMAL LOW (ref 1.5–2.5)

## 2014-02-07 MED ORDER — SODIUM CHLORIDE 0.9 % IV BOLUS (SEPSIS)
1000.0000 mL | Freq: Once | INTRAVENOUS | Status: AC
  Administered 2014-02-07: 1000 mL via INTRAVENOUS

## 2014-02-07 MED ORDER — ALBUTEROL SULFATE (2.5 MG/3ML) 0.083% IN NEBU
2.5000 mg | INHALATION_SOLUTION | Freq: Every day | RESPIRATORY_TRACT | Status: DC
  Administered 2014-02-07 – 2014-02-08 (×2): 2.5 mg via RESPIRATORY_TRACT
  Filled 2014-02-07 (×2): qty 3

## 2014-02-07 MED ORDER — INSULIN ASPART 100 UNIT/ML ~~LOC~~ SOLN
0.0000 [IU] | Freq: Three times a day (TID) | SUBCUTANEOUS | Status: DC
  Administered 2014-02-08: 2 [IU] via SUBCUTANEOUS
  Administered 2014-02-08: 3 [IU] via SUBCUTANEOUS

## 2014-02-07 MED ORDER — CARVEDILOL 3.125 MG PO TABS
3.1250 mg | ORAL_TABLET | Freq: Two times a day (BID) | ORAL | Status: DC
  Filled 2014-02-07 (×2): qty 1

## 2014-02-07 MED ORDER — CITALOPRAM HYDROBROMIDE 10 MG PO TABS
10.0000 mg | ORAL_TABLET | Freq: Every day | ORAL | Status: DC
  Administered 2014-02-07 – 2014-02-09 (×3): 10 mg via ORAL
  Filled 2014-02-07 (×4): qty 1

## 2014-02-07 MED ORDER — ASPIRIN 81 MG PO CHEW
324.0000 mg | CHEWABLE_TABLET | Freq: Once | ORAL | Status: AC
  Administered 2014-02-07: 324 mg via ORAL
  Filled 2014-02-07: qty 4

## 2014-02-07 MED ORDER — ONDANSETRON HCL 4 MG PO TABS
4.0000 mg | ORAL_TABLET | Freq: Four times a day (QID) | ORAL | Status: DC | PRN
Start: 2014-02-07 — End: 2014-02-09

## 2014-02-07 MED ORDER — HEPARIN BOLUS VIA INFUSION
3000.0000 [IU] | Freq: Once | INTRAVENOUS | Status: AC
  Administered 2014-02-07: 3000 [IU] via INTRAVENOUS
  Filled 2014-02-07: qty 3000

## 2014-02-07 MED ORDER — SODIUM CHLORIDE 0.9 % IV SOLN
INTRAVENOUS | Status: DC
  Administered 2014-02-07 – 2014-02-08 (×2): via INTRAVENOUS
  Administered 2014-02-08: 1000 mL via INTRAVENOUS

## 2014-02-07 MED ORDER — ACETAMINOPHEN 325 MG PO TABS: 650.0000 mg | ORAL_TABLET | Freq: Four times a day (QID) | ORAL | Status: DC | PRN

## 2014-02-07 MED ORDER — SODIUM CHLORIDE 0.9 % IJ SOLN
3.0000 mL | Freq: Two times a day (BID) | INTRAMUSCULAR | Status: DC
  Administered 2014-02-07 – 2014-02-09 (×3): 3 mL via INTRAVENOUS

## 2014-02-07 MED ORDER — ACETAMINOPHEN 650 MG RE SUPP
650.0000 mg | Freq: Four times a day (QID) | RECTAL | Status: DC | PRN
Start: 2014-02-07 — End: 2014-02-09

## 2014-02-07 MED ORDER — ONDANSETRON HCL 4 MG/2ML IJ SOLN: 4.0000 mg | Freq: Four times a day (QID) | INTRAMUSCULAR | Status: DC | PRN

## 2014-02-07 MED ORDER — GLIPIZIDE 10 MG PO TABS
10.0000 mg | ORAL_TABLET | Freq: Two times a day (BID) | ORAL | Status: DC
  Administered 2014-02-08 – 2014-02-09 (×3): 10 mg via ORAL
  Filled 2014-02-07 (×5): qty 1

## 2014-02-07 MED ORDER — LEVOTHYROXINE SODIUM 50 MCG PO TABS
50.0000 ug | ORAL_TABLET | Freq: Every day | ORAL | Status: DC
  Administered 2014-02-08 – 2014-02-09 (×2): 50 ug via ORAL
  Filled 2014-02-07 (×3): qty 1

## 2014-02-07 MED ORDER — FLUTICASONE PROPIONATE 50 MCG/ACT NA SUSP
2.0000 | Freq: Every day | NASAL | Status: DC | PRN
  Filled 2014-02-07: qty 16

## 2014-02-07 MED ORDER — ATORVASTATIN CALCIUM 10 MG PO TABS
10.0000 mg | ORAL_TABLET | Freq: Every day | ORAL | Status: DC
  Administered 2014-02-07 – 2014-02-09 (×3): 10 mg via ORAL
  Filled 2014-02-07 (×3): qty 1

## 2014-02-07 MED ORDER — ASPIRIN EC 81 MG PO TBEC
81.0000 mg | DELAYED_RELEASE_TABLET | ORAL | Status: DC
  Administered 2014-02-08: 81 mg via ORAL
  Filled 2014-02-07: qty 1

## 2014-02-07 MED ORDER — CARVEDILOL 6.25 MG PO TABS
6.2500 mg | ORAL_TABLET | Freq: Two times a day (BID) | ORAL | Status: DC
  Administered 2014-02-07 – 2014-02-09 (×4): 6.25 mg via ORAL
  Filled 2014-02-07 (×6): qty 1

## 2014-02-07 MED ORDER — ALUM & MAG HYDROXIDE-SIMETH 200-200-20 MG/5ML PO SUSP: 30.0000 mL | Freq: Four times a day (QID) | ORAL | Status: DC | PRN

## 2014-02-07 MED ORDER — ALBUTEROL SULFATE HFA 108 (90 BASE) MCG/ACT IN AERS: 2.0000 | INHALATION_SPRAY | Freq: Every day | RESPIRATORY_TRACT | Status: DC

## 2014-02-07 MED ORDER — TERBINAFINE HCL 1 % EX CREA
TOPICAL_CREAM | Freq: Two times a day (BID) | CUTANEOUS | Status: DC
  Administered 2014-02-07 – 2014-02-08 (×2): via TOPICAL
  Administered 2014-02-08: 1 via TOPICAL
  Administered 2014-02-09: 11:00:00 via TOPICAL
  Filled 2014-02-07 (×2): qty 12

## 2014-02-07 MED ORDER — ENOXAPARIN SODIUM 40 MG/0.4ML ~~LOC~~ SOLN
40.0000 mg | SUBCUTANEOUS | Status: DC
  Filled 2014-02-07: qty 0.4

## 2014-02-07 MED ORDER — LEVALBUTEROL HCL 0.63 MG/3ML IN NEBU
0.6300 mg | INHALATION_SOLUTION | Freq: Four times a day (QID) | RESPIRATORY_TRACT | Status: DC | PRN
  Filled 2014-02-07: qty 3

## 2014-02-07 MED ORDER — ACARBOSE 100 MG PO TABS
100.0000 mg | ORAL_TABLET | Freq: Three times a day (TID) | ORAL | Status: DC
  Administered 2014-02-08 – 2014-02-09 (×4): 100 mg via ORAL
  Filled 2014-02-07 (×7): qty 1

## 2014-02-07 MED ORDER — HEPARIN (PORCINE) IN NACL 100-0.45 UNIT/ML-% IJ SOLN
1250.0000 [IU]/h | INTRAMUSCULAR | Status: DC
  Administered 2014-02-07: 950 [IU]/h via INTRAVENOUS
  Administered 2014-02-08 – 2014-02-09 (×2): 1250 [IU]/h via INTRAVENOUS
  Filled 2014-02-07 (×3): qty 250

## 2014-02-07 NOTE — ED Notes (Addendum)
0.34 Troponin from Lab. Dr. Leonides Schanz made aware.

## 2014-02-07 NOTE — ED Notes (Signed)
Pt states started having dizziness with standing up yesterday.  Pt denies dizziness with laying down.  No arm drifts.  Pt denies pain

## 2014-02-07 NOTE — ED Notes (Signed)
Abnormal labs given to Dr. Leonides Schanz

## 2014-02-07 NOTE — ED Provider Notes (Signed)
TIME SEEN: 9:15 AM  CHIEF COMPLAINT: Dizziness with standing, hypotension  HPI: Patient is a 80 y.o. M with history of coronary artery disease status post CABG in 1996, multiple stents, renal artery stenosis status post stents, hypertension, diabetes, hyperlipidemia, throat cancer 30 years ago who presents to the emergency department with lightheadedness with standing. He states that this started yesterday but was worse this morning. He denies any chest pain or chest discomfort, shortness of breath, nausea, vomiting, diarrhea, bloody stool or melena, cough, dysuria or hematuria, fevers or chills. He states he has been eating and drinking well. He states he is the primary caregiver for his wife who is extremely ill. He is on lisinopril and hydrochlorothiazide. He has not had any adjustment of his medication recently. He took his medication this morning.  ROS: See HPI Constitutional: no fever  Eyes: no drainage  ENT: no runny nose   Cardiovascular:  no chest pain  Resp: no SOB  GI: no vomiting GU: no dysuria Integumentary: no rash  Allergy: no hives  Musculoskeletal: no leg swelling  Neurological: no slurred speech ROS otherwise negative  PAST MEDICAL HISTORY/PAST SURGICAL HISTORY:  Past Medical History  Diagnosis Date  . Coronary artery disease     s/p CABG 1996 then PCI of SVG to OM 2007  . Diverticulosis   . Dyslipidemia   . Renal artery stenosis     s/p bilateral stents  . Hiatal hernia   . Colon polyps   . Carotid artery stenosis   . Peripheral neuropathy   . Anemia   . Hypothyroidism   . Nasal polyps   . Hypertension   . Diabetes mellitus   . Throat cancer   . Myocardial infarction   . Peripheral vascular disease   . Acute myocardial infarction, unspecified site, initial episode of care   . Acute myocardial infarction of other lateral wall, initial episode of care   . GERD (gastroesophageal reflux disease)   . Shortness of breath     Hx: of    MEDICATIONS:  Prior  to Admission medications   Medication Sig Start Date End Date Taking? Authorizing Provider  acarbose (PRECOSE) 100 MG tablet Take 100 mg by mouth 3 (three) times daily with meals.     Historical Provider, MD  albuterol (PROVENTIL HFA;VENTOLIN HFA) 108 (90 BASE) MCG/ACT inhaler Inhale 2 puffs into the lungs at bedtime.     Historical Provider, MD  amLODipine (NORVASC) 10 MG tablet Take 10 mg by mouth daily.  10/11/11   Historical Provider, MD  aspirin (ASPIR-81) 81 MG EC tablet Take 1 tablet (81 mg total) by mouth daily. Swallow whole. 08/20/13   [name], MD  atorvastatin (LIPITOR) 10 MG tablet Take 10 mg by mouth daily.    Historical Provider, MD  cephALEXin (KEFLEX) 500 MG capsule Take 500 mg by mouth 4 (four) times daily.    Historical Provider, MD  clopidogrel (PLAVIX) 75 MG tablet Take 75 mg by mouth daily with breakfast.    Historical Provider, MD  ferrous sulfate 325 (65 FE) MG tablet Take 325 mg by mouth daily with breakfast.    Historical Provider, MD  finasteride (PROSCAR) 5 MG tablet Take 5 mg by mouth daily.     Historical Provider, MD  fluticasone (FLONASE) 50 MCG/ACT nasal spray Place 1 spray into the nose daily as needed for rhinitis or allergies.     Historical Provider, MD  glipiZIDE (GLUCOTROL) 10 MG tablet Take 10 mg by mouth 2 (two)  times daily before a meal.     Historical Provider, MD  isosorbide mononitrate (IMDUR) 30 MG 24 hr tablet Take 30 mg by mouth daily.    Historical Provider, MD  levothyroxine (SYNTHROID, LEVOTHROID) 50 MCG tablet Take 50 mcg by mouth daily before breakfast.     Historical Provider, MD  lisinopril-hydrochlorothiazide (PRINZIDE,ZESTORETIC) 20-12.5 MG per tablet Take 1 tablet by mouth 2 (two) times daily. 10/13/13   [name], MD  metFORMIN (GLUCOPHAGE) 500 MG tablet Take 2 tablets (1,000 mg total) by mouth 2 (two) times daily with a meal. 11/09/12   [name], MD    ALLERGIES:  No Known Allergies  SOCIAL HISTORY:  History   Substance Use Topics  . Smoking status: Former Smoker -- 0.30 packs/day    Types: Cigarettes    Quit date: 06/18/1946  . Smokeless tobacco: Not on file  . Alcohol Use: No    FAMILY HISTORY: Family History  Problem Relation Age of Onset  . Hypertension Mother   . Heart attack Brother   . Diabetes Son     EXAM: BP 72/53  Pulse 121  Temp(Src) 97.4 F (36.3 C) (Oral)  Resp 20  SpO2 100% CONSTITUTIONAL: Alert and oriented and responds appropriately to questions. Well-appearing; well-nourished, in no distress, elderly HEAD: Normocephalic EYES: Conjunctivae clear, PERRL ENT: normal nose; no rhinorrhea; moist mucous membranes; pharynx without lesions noted NECK: Supple, no meningismus, no LAD; tracheostomy in middle of neck with no drainage CARD: Regular and tachycardic; S1 and S2 appreciated; no murmurs, no clicks, no rubs, no gallops RESP: Normal chest excursion without splinting or tachypnea; breath sounds clear and equal bilaterally; no wheezes, no rhonchi, no rales, no respiratory distress or hypoxia ABD/GI: Normal bowel sounds; non-distended; soft, non-tender, no rebound, no guarding BACK:  The back appears normal and is non-tender to palpation, there is no CVA tenderness EXT: Normal ROM in all joints; non-tender to palpation; no edema; normal capillary refill; no cyanosis    SKIN: Normal color for age and race; warm NEURO: Moves all extremities equally, sensation to light touch intact diffusely, cranial nerves II through XII intact PSYCH: The patient's mood and manner are appropriate. Grooming and personal hygiene are appropriate.  MEDICAL DECISION MAKING: Patient here with hypotension. Unclear etiology for patient's hypertension as he is not having any infectious symptoms. No chest pain or shortness of breath. He has no abdominal or back pain. I feel this is less likely cardiogenic shock given he has only mildly decreased EF on bedside ultrasound. Doubt PE or dissection given  he is not having pain or other symptoms. Possible dehydration. We'll give IV fluids, check labs, cultures, lactate, chest x-ray and urine. His EKG is abnormal compared to prior. He does have an old left bundle branch block but appears to have some ST depressions that are new and likely secondary to strain from hypotension and tachycardia.  ED PROGRESS: Patient's labs are relatively unremarkable. Hemoglobin is 11.9. Urine shows no sign of infection. Chest x-ray clear. Lactate is normal. Patient does have a mildly elevated troponin which again may be secondary to strain. His blood pressure has markedly improved as well as his heart rate after 1 L fluid. We'll continue hydration. We'll repeat troponin and EKG.    12:05 PM  Pt's blood pressure has improved with IV hydration. His heart rate has also improved markedly. He is still asymptomatic. He does have a mild elevated troponin otherwise labs are unremarkable. Chest x-ray and urine clear. Lactate normal.  Discussed with Dr. Wonda Cerise with cardiology. He agrees with medicine admission and if they need a formal consult he asked that the hospitalist call him directly.   12:12 PM  Spoke with Dr. Allyson Sabal for admission inpt, stepdown.    EKG Interpretation  Date/Time:  Sunday February 07 2014 08:31:47 EDT Ventricular Rate:  120 PR Interval:    QRS Duration: 140 QT Interval:  398 QTC Calculation: 562 R Axis:   119 Text Interpretation:  Wide QRS rhythm Right axis deviation Left bundle branch block Abnormal ECG Confirmed by Ruthann Angulo,  DO, Romond Pipkins [phone]) on 02/07/2014 8:43:19 AM         EKG Interpretation  Date/Time:  Sunday February 07 2014 11:28:08 EDT Ventricular Rate:  99 PR Interval:    QRS Duration: 157 QT Interval:  428 QTC Calculation: 549 R Axis:   -37 Text Interpretation:  Atrial flutter Left bundle branch block Confirmed by Aaron Boeh,  DO, Gurtej Noyola ([id number]) on 02/07/2014 12:08:25 PM          EMERGENCY DEPARTMENT [place] CARDIAC EXAM "Study: Limited  Ultrasound of the heart and pericardium"  INDICATIONS:Hypotension Multiple views of the heart and pericardium were obtained in real-time with a multi-frequency probe.  PERFORMED UU:EKCMKL  IMAGES ARCHIVED?: Yes  FINDINGS: No pericardial effusion; normal contractility  LIMITATIONS:  Emergent procedure  VIEWS USED: Parasternal long axis  INTERPRETATION: Normal contractility    CRITICAL CARE Performed by: Nyra Jabs   Total critical care time: 45 minutes  Critical care time was exclusive of separately billable procedures and treating other patients.  Critical care was necessary to treat or prevent imminent or life-threatening deterioration.  Critical care was time spent personally by me on the following activities: development of treatment plan with patient and/or surrogate as well as nursing, discussions with consultants, evaluation of patient's response to treatment, examination of patient, obtaining history from patient or surrogate, ordering and performing treatments and interventions, ordering and review of laboratory studies, ordering and review of radiographic studies, pulse oximetry and re-evaluation of patient's condition.       [place], DO 02/07/14 1212

## 2014-02-07 NOTE — ED Notes (Signed)
Attempted report 

## 2014-02-07 NOTE — ED Notes (Signed)
Meal tray ordered for patient, heart diet

## 2014-02-07 NOTE — ED Notes (Addendum)
Lactic Acid     2.18 mmol/L

## 2014-02-07 NOTE — ED Notes (Signed)
Trach cleaning kit ordered from Respiratory.

## 2014-02-07 NOTE — Progress Notes (Addendum)
ANTICOAGULATION CONSULT NOTE - Follow Up Consult  Pharmacy Consult for Heparin  Indication: atrial fibrillation  No Known Allergies  Patient Measurements: Height: 5\' 7"  (170.2 cm) Weight: 150 lb 5.7 oz (68.2 kg) IBW/kg (Calculated) : 66.1  Vital Signs: Temp: 98 F (36.7 C) (08/23 2012) Temp src: Oral (08/23 2012) BP: 118/65 mmHg (08/23 2012) Pulse Rate: 88 (08/23 2012)  Labs:  Recent Labs  02/07/14 0925 02/07/14 1121 02/07/14 1305 02/07/14 1920 02/07/14 2159  HGB 11.9*  --  10.9*  --   --   HCT 34.2*  --  32.9*  --   --   PLT 258  --  230  --   --   HEPARINUNFRC  --   --   --   --  0.22*  CREATININE 1.11  --  0.97  --   --   TROPONINI  --  0.34*  --  0.30*  --     Estimated Creatinine Clearance: 49.2 ml/min (by C-G formula based on Cr of 0.97).  Assessment: Sub-therapeutic HL, no issues per RN.   Goal of Therapy:  Heparin level 0.3-0.7 units/ml Monitor platelets by anticoagulation protocol: Yes   Plan:  -Increase heparin to 1100 units/hr -HL with AM labs -Daily CBC/HL -Monitor for bleeding  Narda Bonds 02/07/2014,10:44 PM  ============================= Addendum 5:03 AM Heparin level 0.22 despite rate increase, no issues per RN -Heparin 1500 units BOLUS -Increase heparin drip to 1250 units/hr -1300 HL JLedford, PharmD

## 2014-02-07 NOTE — ED Notes (Addendum)
CBG Taken = 120 Temperature Taken = 97.33F

## 2014-02-07 NOTE — Progress Notes (Signed)
Pt does not seem appropriate for STEP-DOWN. Called Dr. Allyson Sabal (the admitting doctor) and she said that pt can go to Telemetry floor. Patient placement is notified. [name] ( charge nurse 2C).

## 2014-02-07 NOTE — Consult Note (Signed)
CARDIOLOGY CONSULT NOTE  Patient ID: [name], MRN: [id number], DOB/AGE: 08-28-1922 80 y.o. Admit date: 02/07/2014 Date of Consult: 02/07/2014  Primary Physician: Mathews Argyle, MD Primary Cardiologist: JV  Chief Complaint: Atrial fibrillation with + Tn   HPI [name] is a 80 y.o. male  Admitted with lightheadedness and some SOB who on arrival was noted to have BP 75 and HR 120; HE noted some LH yesterday with standing, and was worse this am, esp after taking his antihypertensives  Denies recent bleeding, change in meds, or prolonged travel  Rhythm most consistent with atypical atrial flutter;  No prior known hx of Afib  TERF Age-27, HTN-1 DM-1 Vasc Dis-1  CHADSVASc==>5  Does have chronic anemia  Has history of CAD with prior CABG 1996, stent 2007 and NSTEMI 2014 who underwent stenting of SVG-OM by JV  Other graftes were patent and LVEF 45% with mild AS (Mean grad 12)   Has Per vas dis with 50% or so (L>R) bilateral carotid stenosis and some symptoms suggestive of claudication  Notwithstanding the above he is very active with few day to day limitations, even mowing his neighbors yard yesterday  Bedside echo in ER >>No effusion   Past Medical History  Diagnosis Date  . Coronary artery disease     s/p CABG 1996 then PCI of SVG to OM 2007  . Diverticulosis   . Dyslipidemia   . Renal artery stenosis     s/p bilateral stents  . Hiatal hernia   . Colon polyps   . Carotid artery stenosis     50% BILATERAL 2014  . Peripheral neuropathy   . Anemia   . Hypothyroidism   . Nasal polyps   . Hypertension   . Diabetes mellitus   . Throat cancer S/P TRACHECTOMY   . Myocardial infarction   . Peripheral vascular disease     LOWER EXT  . Acute myocardial infarction, unspecified site, initial episode of care   . Atrial fibrillation/flutter   . GERD (gastroesophageal reflux disease)   . Shortness of breath     Hx: of  . HOH (hard of hearing) VERY        Surgical History:  Past Surgical History  Procedure Laterality Date  . Coronary artery bypass graft      1996  . Cardiac catheterization      PCI of SVG to OM 2007  . Renal artery stent    . Tracheostomy    . Appendectomy    . Tonsillectomy       Home Meds: Prior to Admission medications   Medication Sig Start Date End Date Taking? Authorizing Provider  acarbose (PRECOSE) 100 MG tablet Take 100 mg by mouth 3 (three) times daily with meals.    Yes Historical Provider, MD  albuterol (PROVENTIL HFA;VENTOLIN HFA) 108 (90 BASE) MCG/ACT inhaler Inhale 2-3 puffs into the lungs at bedtime.    Yes Historical Provider, MD  aspirin EC 81 MG tablet Take 81 mg by mouth 2 (two) times a week.   Yes Historical Provider, MD  atorvastatin (LIPITOR) 10 MG tablet Take 10 mg by mouth daily.   Yes Historical Provider, MD  citalopram (CELEXA) 10 MG tablet Take 10 mg by mouth daily.   Yes Historical Provider, MD  ferrous sulfate 325 (65 FE) MG tablet Take 325 mg by mouth daily with breakfast.   Yes Historical Provider, MD  finasteride (PROSCAR) 5 MG tablet Take 5 mg by mouth daily.  Yes Historical Provider, MD  fluticasone (FLONASE) 50 MCG/ACT nasal spray Place 2 sprays into both nostrils daily as needed for rhinitis or allergies.    Yes Historical Provider, MD  glipiZIDE (GLUCOTROL) 10 MG tablet Take 10 mg by mouth 2 (two) times daily before a meal.    Yes Historical Provider, MD  levothyroxine (SYNTHROID, LEVOTHROID) 50 MCG tablet Take 50 mcg by mouth daily before breakfast.    Yes Historical Provider, MD  lisinopril-hydrochlorothiazide (PRINZIDE,ZESTORETIC) 20-12.5 MG per tablet Take 1 tablet by mouth 2 (two) times daily. 10/13/13  Yes [name], MD  metFORMIN (GLUCOPHAGE) 500 MG tablet Take 2 tablets (1,000 mg total) by mouth 2 (two) times daily with a meal. 11/09/12  Yes [name], MD    Inpatient Medications:  . carvedilol  3.125 mg Oral BID WC  . enoxaparin (LOVENOX) injection   40 mg Subcutaneous Q24H  . insulin aspart  0-15 Units Subcutaneous TID WC  . sodium chloride  3 mL Intravenous Q12H  . terbinafine   Topical BID    Allergies: No Known Allergies  History   Social History  . Marital Status: Married    Spouse Name: [name]    Number of Children: [name]  . Years of Education: [name]   Occupational History  . Not on file.   Social History Main Topics  . Smoking status: Former Smoker -- 0.30 packs/day    Types: Cigarettes    Quit date: 06/18/1946  . Smokeless tobacco: Not on file  . Alcohol Use: No  . Drug Use: No  . Sexual Activity: Not on file   Other Topics Concern  . Not on file   Social History Narrative   ** Merged History Encounter **         Family History  Problem Relation Age of Onset  . Hypertension Mother   . Heart attack Brother   . Diabetes Son      ROS:  Please see the history of present illness.     All other systems reviewed and negative.    Physical Exam:  Blood pressure 112/74, pulse 98, temperature 97.2 F (36.2 C), temperature source Rectal, resp. rate 19, SpO2 100.00%. General: Well developed, well nourished age appearaing caucasian  male in no acute distress with VOCAL STOMA Head: Normocephalic, atraumatic, sclera non-icteric, no xanthomas, nares are without discharge. EENT: normal  Lymph Nodes:  none Neck: Left  carotid bruits. JVD not elevated. Stoma s/p trachectomy Back:without scoliosis kyphosis Lungs: Clear bilaterally to auscultation without wheezes, rales, or rhonchi. Breathing is unlabored. Heart: Irregularly irregular with S1 S2. No  /6   murmur . No rubs, or gallops appreciated. Abdomen: Soft, non-tender, non-distended with normoactive bowel sounds. No hepatomegaly. No rebound/guarding. No obvious abdominal masses. Msk:  Strength and tone appear normal for age. Extremities: No clubbing or cyanosis. No + edema.  Distal pedal pulses are 2+ and equal bilaterally. Skin: Warm and Dry Neuro: Alert and oriented  X 3. CN III-XII intact Grossly normal sensory and motor function . Psych:  Responds to questions appropriately with a normal affect.      Labs: Cardiac Enzymes  Recent Labs  02/07/14 1121  TROPONINI 0.34*   CBC Lab Results  Component Value Date   WBC 5.7 02/07/2014   HGB 10.9* 02/07/2014   HCT 32.9* 02/07/2014   MCV 92.9 02/07/2014   PLT 230 02/07/2014   PROTIME: No results found for this basename: LABPROT, INR,  in the last 72 hours Chemistry  Recent Labs Lab 02/07/14 0925 02/07/14 1305  NA 138  --   K 4.3  --   CL 102  --   CO2 23  --   BUN 24*  --   CREATININE 1.11 0.97  CALCIUM 8.7  --   PROT 6.0 5.5*  BILITOT 0.5 0.5  ALKPHOS 51 49  ALT 8 8  AST 14 13  GLUCOSE 146*  --    Lipids Lab Results  Component Value Date   CHOL 123 11/07/2012   HDL 49 11/07/2012   LDLCALC 66 11/07/2012   TRIG 38 11/07/2012   BNP Pro B Natriuretic peptide (BNP)  Date/Time Value Ref Range Status  05/17/2013  6:25 PM 983.2* 0 - 450 pg/mL Final  06/24/2011 10:25 AM 537.5* 0 - 450 pg/mL Final   Miscellaneous Lab Results  Component Value Date   DDIMER  Value: 0.39        AT THE INHOUSE ESTABLISHED CUTOFF VALUE OF 0.48 ug/mL FEU, THIS ASSAY HAS BEEN DOCUMENTED IN THE LITERATURE TO HAVE A SENSITIVITY AND NEGATIVE PREDICTIVE VALUE OF AT LEAST 98 TO 99%.  THE TEST RESULT SHOULD BE CORRELATED WITH AN ASSESSMENT OF THE CLINICAL PROBABILITY OF DVT / VTE. 01/07/2010    Radiology/Studies:  Dg Chest Port 1 View  02/07/2014   CLINICAL DATA:  Dizziness.  EXAM: PORTABLE CHEST - 1 VIEW  COMPARISON:  May 17, 2013.  FINDINGS: The heart size and mediastinal contours are within normal limits. Status post coronary artery bypass graft. No pneumothorax or pleural effusion is noted. Both lungs are clear. The visualized skeletal structures are unremarkable.  IMPRESSION: No acute cardiopulmonary abnormality seen.   Electronically Signed   By: Sabino Dick M.D.   On: 02/07/2014 10:43    EKG: Atrial fib( i  think as there are distinct deflections noted int he ST segment at CL 500 suggesting atrial tach or perhaps slower atrial flutter) LBBB-old   Assessment and Plan:   Atrial Fib/Flutter  RVR  CHADSVASc 5  Lightheadedness/Hypotension  rx with IV fluids  CAD with Prior CABG and PCI  \ Borderline + Tn  Pt presents with new onset atrial arrhythmia and RVR assoc with Hypotension.  There has been improvement with hydration with slowing concomitantly of the heart rate, raising the secondary question as to intravascular depletion.  The borderline +Tn is concerning in light of last years presentation when similar levels were associated with a high grade SVG lesion, and then whether the Afib is secondary to a myocardial ischemic event or the other way around  The low BP also prompts the question of PE, but dypsnea not a major complaint and treatments will include anticoagulation anyway  For now 1- rate control -low dose BB 2) full dose anticoagulation with IV heparin in the event that we choose to pursue catheterization.  If not would change him to [place] in the am.  I have discussed risks and benefits of anticoagulation with the pt and family 3) serial Ez 4) anticipate DCCV in 3-4 weeks if asymptomatic, otherwise in absence of symptoms assoc with his atrial arrhythmia which leaves [place] with no clear date of onset, would need TEE] 5) TSH 6) reasonable to check echo   Virl Axe

## 2014-02-07 NOTE — ED Notes (Addendum)
Family member Inez Catalina states she will be taking home pt's medications, states she will leave his hearing aids

## 2014-02-07 NOTE — Progress Notes (Signed)
ANTICOAGULATION CONSULT NOTE - Initial Consult  Pharmacy Consult for heparin Indication: atrial fibrillation  No Known Allergies  Patient Measurements:   Heparin Dosing Weight: 67.6 kg  Vital Signs: Temp: 97.2 F (36.2 C) (08/23 1209) Temp src: Rectal (08/23 0947) BP: 114/65 mmHg (08/23 1500) Pulse Rate: 100 (08/23 1500)  Labs:  Recent Labs  02/07/14 0925 02/07/14 1121 02/07/14 1305  HGB 11.9*  --  10.9*  HCT 34.2*  --  32.9*  PLT 258  --  230  CREATININE 1.11  --  0.97  TROPONINI  --  0.34*  --     The CrCl is unknown because both a height and weight (above a minimum accepted value) are required for this calculation.   Medical History: Past Medical History  Diagnosis Date  . Coronary artery disease     s/p CABG 1996 then PCI of SVG to OM 2007  . Diverticulosis   . Dyslipidemia   . Renal artery stenosis     s/p bilateral stents  . Hiatal hernia   . Colon polyps   . Carotid artery stenosis     50% BILATERAL 2014  . Peripheral neuropathy   . Anemia   . Hypothyroidism   . Nasal polyps   . Hypertension   . Diabetes mellitus   . Throat cancer S/P TRACHECTOMY   . Myocardial infarction   . Peripheral vascular disease     LOWER EXT  . Acute myocardial infarction, unspecified site, initial episode of care   . Atrial fibrillation/flutter   . GERD (gastroesophageal reflux disease)   . Shortness of breath     Hx: of  . HOH (hard of hearing) VERY     Medications:  See medication history  Assessment: 80 yo man to start heparin for afib with abnormal troponin.  His baseline Hg is 10.9, PTLC wnl.  He was not on anticoagulants PTA Goal of Therapy:  Heparin level 0.3-0.7 units/ml Monitor platelets by anticoagulation protocol: Yes   Plan:  Heparin bolus 3000 units and drip at 950 units/hr Check heparin level 6 hours after start. Daily heparin level and CBC while on heparin. Monitor for bleeding   Thanks for allowing pharmacy to be a part of this patient's  care.  Excell Seltzer, PharmD Clinical Pharmacist, [phone] 02/07/2014,3:22 PM

## 2014-02-07 NOTE — H&P (Addendum)
[facility] History and Physical  [name] CZY:[id number] DOB: 1922/06/19 DOA: 02/07/2014  Referring physician:   PCP: Mathews Argyle, MD   Chief Complaint: Dizziness HPI:  80 year old male with a history of coronary artery disease, non-ST elevation MI in 8/14, history of coronary disease status post coronary artery bypass grafting in 1996 and then non-ST elevation MI in August of 2007 at which time he was found to have an occluded saphenous vein graft to the obtuse marginal and underwent PCI. Comes to the ER today with a chief complaint of feeling dizzy lightheaded. The patient was hypotensive upon arrival. Patient symptoms started yesterday morning when the patient was mowing  his neighbor's lawn. He denied any chest discomfort, or shortness of breath, however he started developing cramps in both legs, especially his feet. He slept well last night. This morning the patient started feeling dizzy again after taking his antihypertensive medications including lisinopril, Norvasc,. Imdur, HCTZ The ER the patient's systolic blood pressure was in the 70s he was tachycardic into the 120s, blood pressure improved after receiving IV fluid boluses. Patient found to be in slow atrial fibrillation on his EKG which is new diagnosis for him.    Review of Systems: negative for the following  Constitutional: Denies fever, chills, diaphoresis, appetite change and fatigue.  HEENT: Denies photophobia, eye pain, redness, hearing loss, ear pain, congestion, sore throat, rhinorrhea, sneezing, mouth sores, trouble swallowing, neck pain, neck stiffness and tinnitus.  Respiratory: Denies SOB, DOE, cough, chest tightness, and wheezing.  Cardiovascular: Denies chest pain, palpitations and leg swelling.  Gastrointestinal: Denies nausea, vomiting, abdominal pain, diarrhea, constipation, blood in stool and abdominal distention.  Genitourinary: Denies dysuria, urgency, frequency, hematuria, flank pain and  difficulty urinating.  Musculoskeletal: Denies myalgias, back pain, joint swelling, arthralgias and gait problem.  Skin: Denies pallor, rash and wound.  Neurological: Positive for dizziness, seizures, syncope, weakness, light-headedness, numbness and headaches.  Hematological: Denies adenopathy. Easy bruising, personal or family bleeding history  Psychiatric/Behavioral: Denies suicidal ideation, mood changes, confusion, nervousness, sleep disturbance and agitation       Past Medical History  Diagnosis Date  . Coronary artery disease     s/p CABG 1996 then PCI of SVG to OM 2007  . Diverticulosis   . Dyslipidemia   . Renal artery stenosis     s/p bilateral stents  . Hiatal hernia   . Colon polyps   . Carotid artery stenosis   . Peripheral neuropathy   . Anemia   . Hypothyroidism   . Nasal polyps   . Hypertension   . Diabetes mellitus   . Throat cancer   . Myocardial infarction   . Peripheral vascular disease   . Acute myocardial infarction, unspecified site, initial episode of care   . Acute myocardial infarction of other lateral wall, initial episode of care   . GERD (gastroesophageal reflux disease)   . Shortness of breath     Hx: of     Past Surgical History  Procedure Laterality Date  . Coronary artery bypass graft      1996  . Cardiac catheterization      PCI of SVG to OM 2007  . Renal artery stent    . Tracheostomy    . Appendectomy    . Tonsillectomy        Social History:  reports that he quit smoking about 67 years ago. His smoking use included Cigarettes. He smoked 0.30 packs per day. He does not have any smokeless tobacco history  on file. He reports that he does not drink alcohol or use illicit drugs.    No Known Allergies  Family History  Problem Relation Age of Onset  . Hypertension Mother   . Heart attack Brother   . Diabetes Son      Prior to Admission medications   Medication Sig Start Date End Date Taking? Authorizing Provider   acarbose (PRECOSE) 100 MG tablet Take 100 mg by mouth 3 (three) times daily with meals.    Yes Historical Provider, MD  albuterol (PROVENTIL HFA;VENTOLIN HFA) 108 (90 BASE) MCG/ACT inhaler Inhale 2-3 puffs into the lungs at bedtime.    Yes Historical Provider, MD  aspirin EC 81 MG tablet Take 81 mg by mouth 2 (two) times a week.   Yes Historical Provider, MD  atorvastatin (LIPITOR) 10 MG tablet Take 10 mg by mouth daily.   Yes Historical Provider, MD  citalopram (CELEXA) 10 MG tablet Take 10 mg by mouth daily.   Yes Historical Provider, MD  ferrous sulfate 325 (65 FE) MG tablet Take 325 mg by mouth daily with breakfast.   Yes Historical Provider, MD  finasteride (PROSCAR) 5 MG tablet Take 5 mg by mouth daily.    Yes Historical Provider, MD  fluticasone (FLONASE) 50 MCG/ACT nasal spray Place 2 sprays into both nostrils daily as needed for rhinitis or allergies.    Yes Historical Provider, MD  glipiZIDE (GLUCOTROL) 10 MG tablet Take 10 mg by mouth 2 (two) times daily before a meal.    Yes Historical Provider, MD  levothyroxine (SYNTHROID, LEVOTHROID) 50 MCG tablet Take 50 mcg by mouth daily before breakfast.    Yes Historical Provider, MD  lisinopril-hydrochlorothiazide (PRINZIDE,ZESTORETIC) 20-12.5 MG per tablet Take 1 tablet by mouth 2 (two) times daily. 10/13/13  Yes [name], MD  metFORMIN (GLUCOPHAGE) 500 MG tablet Take 2 tablets (1,000 mg total) by mouth 2 (two) times daily with a meal. 11/09/12  Yes [name], MD     Physical Exam: Filed Vitals:   02/07/14 0947 02/07/14 1021 02/07/14 1040 02/07/14 1209  BP: 94/58 94/58 114/57   Pulse: 113 86 90   Temp: 97.8 F (36.6 C)   97.2 F (36.2 C)  TempSrc: Rectal     Resp: 22 18 18    SpO2: 100% 100% 100%      Constitutional: Vital signs reviewed. Patient is a well-developed and well-nourished in no acute distress and cooperative with exam. Alert and oriented x3.  Head: Normocephalic and atraumatic  Ear: TM normal  bilaterally  Mouth: no erythema or exudates, MMM  Eyes: PERRL, EOMI, conjunctivae normal, No scleral icterus.  Neck: Supple, Trachea midline normal ROM, No JVD, mass, thyromegaly, or carotid bruit present.  Cardiovascular: RRR, S1 normal, S2 normal, no MRG, pulses symmetric and intact bilaterally  Pulmonary/Chest: CTAB, no wheezes, rales, or rhonchi  Abdominal: Soft. Non-tender, non-distended, bowel sounds are normal, no masses, organomegaly, or guarding present.  GU: no CVA tenderness Musculoskeletal: No joint deformities, erythema, or stiffness, ROM full and no nontender Ext: no edema and no cyanosis, pulses palpable bilaterally (DP and PT)  Hematology: no cervical, inginal, or axillary adenopathy.  Neurological: A&O x3, Strenght is normal and symmetric bilaterally, cranial nerve II-XII are grossly intact, no focal motor deficit, sensory intact to light touch bilaterally.  Skin: Warm, dry and intact. No rash, cyanosis, or clubbing.  Psychiatric: Normal mood and affect. speech and behavior is normal. Judgment and thought content normal. Cognition and memory are normal.  Labs on Admission:    Basic Metabolic Panel:  Recent Labs Lab 02/07/14 0925  NA 138  K 4.3  CL 102  CO2 23  GLUCOSE 146*  BUN 24*  CREATININE 1.11  CALCIUM 8.7   Liver Function Tests:  Recent Labs Lab 02/07/14 0925  AST 14  ALT 8  ALKPHOS 51  BILITOT 0.5  PROT 6.0  ALBUMIN 3.3*   No results found for this basename: LIPASE, AMYLASE,  in the last 168 hours No results found for this basename: AMMONIA,  in the last 168 hours CBC:  Recent Labs Lab 02/07/14 0925  WBC 6.6  HGB 11.9*  HCT 34.2*  MCV 92.4  PLT 258   Cardiac Enzymes:  Recent Labs Lab 02/07/14 1121  TROPONINI 0.34*    BNP (last 3 results)  Recent Labs  05/17/13 1825  PROBNP 983.2*      CBG:  Recent Labs Lab 02/07/14 0941  GLUCAP 120*    Radiological Exams on Admission: Dg Chest Port 1 View  02/07/2014    CLINICAL DATA:  Dizziness.  EXAM: PORTABLE CHEST - 1 VIEW  COMPARISON:  May 17, 2013.  FINDINGS: The heart size and mediastinal contours are within normal limits. Status post coronary artery bypass graft. No pneumothorax or pleural effusion is noted. Both lungs are clear. The visualized skeletal structures are unremarkable.  IMPRESSION: No acute cardiopulmonary abnormality seen.   Electronically Signed   By: Sabino Dick M.D.   On: 02/07/2014 10:43    EKG: Independently reviewed. Slow atrial fibrillation  Assessment/Plan Active Problems:   Hypotension   Dehydration, moderate   New onset atrial fibrillation Symptoms started yesterday morning Currently rate controlled We'll start the patient on low-dose Coreg,see if blood pressure tolerates CHADSVASc 5 He would need long-term anticoagulation Therefore we'll start the patient on heparin drip Cycle troponin TSH, continue aspirin Electrophysiology consult by Dr. Caryl Comes   Dyslipidemia We'll check a lipid panel continue statin  Diabetes mellitus Continue Glucotrol and start the patient on SSI and hemoglobin A1c  Hypertension hold off on antihypertensive medications until seen by cardiology  Hypothyroidism Check TSH continue Synthroid  Onychomycosis Start the patient on Lamisil  Code Status:   full Family Communication: bedside Disposition Plan: admit   Time spent: 70 mins   [facility] Pager [phone]  If 7PM-7AM, please contact night-coverage [email or web address] Password [email or web address] 02/07/2014, 1:21 PM

## 2014-02-08 DIAGNOSIS — I219 Acute myocardial infarction, unspecified: Secondary | ICD-10-CM

## 2014-02-08 DIAGNOSIS — I252 Old myocardial infarction: Secondary | ICD-10-CM

## 2014-02-08 DIAGNOSIS — E038 Other specified hypothyroidism: Secondary | ICD-10-CM

## 2014-02-08 DIAGNOSIS — I369 Nonrheumatic tricuspid valve disorder, unspecified: Secondary | ICD-10-CM

## 2014-02-08 DIAGNOSIS — I9589 Other hypotension: Secondary | ICD-10-CM | POA: Diagnosis not present

## 2014-02-08 LAB — COMPREHENSIVE METABOLIC PANEL
ALBUMIN: 2.9 g/dL — AB (ref 3.5–5.2)
ALK PHOS: 46 U/L (ref 39–117)
ALT: 7 U/L (ref 0–53)
AST: 12 U/L (ref 0–37)
Anion gap: 9 (ref 5–15)
BUN: 24 mg/dL — ABNORMAL HIGH (ref 6–23)
CALCIUM: 7.9 mg/dL — AB (ref 8.4–10.5)
CO2: 24 mEq/L (ref 19–32)
Chloride: 108 mEq/L (ref 96–112)
Creatinine, Ser: 0.94 mg/dL (ref 0.50–1.35)
GFR calc Af Amer: 84 mL/min — ABNORMAL LOW (ref >90)
GFR calc non Af Amer: 72 mL/min — ABNORMAL LOW (ref >90)
Glucose, Bld: 156 mg/dL — ABNORMAL HIGH (ref 70–99)
POTASSIUM: 4 meq/L (ref 3.7–5.3)
SODIUM: 141 meq/L (ref 137–147)
TOTAL PROTEIN: 5.3 g/dL — AB (ref 6.0–8.3)
Total Bilirubin: 0.2 mg/dL — ABNORMAL LOW (ref 0.3–1.2)

## 2014-02-08 LAB — GLUCOSE, CAPILLARY
GLUCOSE-CAPILLARY: 104 mg/dL — AB (ref 70–99)
Glucose-Capillary: 146 mg/dL — ABNORMAL HIGH (ref 70–99)
Glucose-Capillary: 164 mg/dL — ABNORMAL HIGH (ref 70–99)
Glucose-Capillary: 104 mg/dL — ABNORMAL HIGH (ref 70–99)

## 2014-02-08 LAB — URINE CULTURE
Colony Count: NO GROWTH
Culture: NO GROWTH

## 2014-02-08 LAB — CBC
HEMATOCRIT: 30.3 % — AB (ref 39.0–52.0)
HEMOGLOBIN: 10.1 g/dL — AB (ref 13.0–17.0)
MCH: 31 pg (ref 26.0–34.0)
MCHC: 33.3 g/dL (ref 30.0–36.0)
MCV: 92.9 fL (ref 78.0–100.0)
Platelets: 221 10*3/uL (ref 150–400)
RBC: 3.26 MIL/uL — AB (ref 4.22–5.81)
RDW: 13.7 % (ref 11.5–15.5)
WBC: 5 10*3/uL (ref 4.0–10.5)

## 2014-02-08 LAB — HEPARIN LEVEL (UNFRACTIONATED)
HEPARIN UNFRACTIONATED: 0.22 [IU]/mL — AB (ref 0.30–0.70)
Heparin Unfractionated: 0.22 IU/mL — ABNORMAL LOW (ref 0.30–0.70)
Heparin Unfractionated: 0.65 IU/mL (ref 0.30–0.70)

## 2014-02-08 LAB — HEMOGLOBIN A1C
Hgb A1c MFr Bld: 7.9 % — ABNORMAL HIGH (ref <5.7)
Mean Plasma Glucose: 180 mg/dL — ABNORMAL HIGH (ref <117)

## 2014-02-08 LAB — TROPONIN I: Troponin I: <0.3 ng/mL (ref <0.30)

## 2014-02-08 MED ORDER — ALPRAZOLAM 0.25 MG PO TABS: 0.2500 mg | ORAL_TABLET | Freq: Three times a day (TID) | ORAL | Status: DC | PRN

## 2014-02-08 MED ORDER — HEPARIN BOLUS VIA INFUSION
1500.0000 [IU] | Freq: Once | INTRAVENOUS | Status: AC
  Administered 2014-02-08: 1500 [IU] via INTRAVENOUS
  Filled 2014-02-08: qty 1500

## 2014-02-08 NOTE — Progress Notes (Signed)
[facility] PROGRESS NOTE  [name] MGQ:[id number] DOB: 08-28-1922 DOA: 02/07/2014 PCP: Mathews Argyle, MD  Assessment/Plan: New onset atrial fibrillation  -Symptoms began one day prior to admit -Currently rate controlled  -Started low-dose Coreg  -Anticipated DCCV in 3-4 weeks per EP if stable CHADSVASc 5  - He would need long-term anticoagulation  - Therefore we'll start the patient on heparin drip  - Troponin serially neg - TSH 8.24 - Continue aspirin  - Electrophysiology consult by Dr. Caryl Comes  Dyslipidemia  - We'll check a lipid panel continue statin  Diabetes mellitus  - Continue Glucotrol  - On SSI and hemoglobin A1c  Hypertension  - holding off on antihypertensive medications until seen by cardiology  Hypothyroidism  -Continue Synthroid  -TSH elevated - will check free T4 Onychomycosis  -Started the patient on Lamisil  Code Status: Full Family Communication: Pt in room Disposition Plan: Pending   Consultants:  Cardiology  Procedures:    Antibiotics:    HPI/Subjective: No acute events noted overnight  Objective: Filed Vitals:   02/07/14 2342 02/08/14 0400 02/08/14 0812 02/08/14 0913  BP: 134/60 132/66 132/74   Pulse: 86 89 101 118  Temp: 97.4 F (36.3 C) 97.4 F (36.3 C) 97.3 F (36.3 C)   TempSrc: Oral Oral Oral   Resp: 18 18 17    Height:      Weight:  68.04 kg (150 lb)    SpO2: 98% 98% 100%     Intake/Output Summary (Last 24 hours) at 02/08/14 1100 Last data filed at 02/08/14 0900  Gross per 24 hour  Intake      0 ml  Output    800 ml  Net   -800 ml   Filed Weights   02/07/14 2012 02/08/14 0400  Weight: 68.2 kg (150 lb 5.7 oz) 68.04 kg (150 lb)    Exam:   General:  Awake, in nad  Cardiovascular: irregularly irregular, s1, s2  Respiratory: normal resp effort, no wheezing  Abdomen: soft,nondistended  Musculoskeletal: perfused, no clubbing   Data Reviewed: Basic Metabolic Panel:  Recent Labs Lab  02/07/14 0925 02/07/14 1305 02/08/14 0005  NA 138  --  141  K 4.3  --  4.0  CL 102  --  108  CO2 23  --  24  GLUCOSE 146*  --  156*  BUN 24*  --  24*  CREATININE 1.11 0.97 0.94  CALCIUM 8.7  --  7.9*  MG  --  1.3*  --    Liver Function Tests:  Recent Labs Lab 02/07/14 0925 02/07/14 1305 02/08/14 0005  AST 14 13 12   ALT 8 8 7   ALKPHOS 51 49 46  BILITOT 0.5 0.5 0.2*  PROT 6.0 5.5* 5.3*  ALBUMIN 3.3* 3.1* 2.9*   No results found for this basename: LIPASE, AMYLASE,  in the last 168 hours No results found for this basename: AMMONIA,  in the last 168 hours CBC:  Recent Labs Lab 02/07/14 0925 02/07/14 1305 02/08/14 0005  WBC 6.6 5.7 5.0  HGB 11.9* 10.9* 10.1*  HCT 34.2* 32.9* 30.3*  MCV 92.4 92.9 92.9  PLT 258 230 221   Cardiac Enzymes:  Recent Labs Lab 02/07/14 1121 02/07/14 1920 02/08/14 0005  TROPONINI 0.34* 0.30* <0.30   BNP (last 3 results)  Recent Labs  05/17/13 1825  PROBNP 983.2*   CBG:  Recent Labs Lab 02/07/14 0941 02/07/14 1645 02/07/14 2016 02/08/14 0815  GLUCAP 120* 104* 166* 146*    Recent Results (from the past  240 hour(s))  CULTURE, BLOOD (ROUTINE X 2)     Status: None   Collection Time    02/07/14  9:25 AM      Result Value Ref Range Status   Specimen Description BLOOD RIGHT WRIST   Final   Special Requests BOTTLES DRAWN AEROBIC AND ANAEROBIC 1CC EACH   Final   Culture  Setup Time     Final   Value: 02/07/2014 17:45     Performed at [facility]   Culture     Final   Value:        BLOOD CULTURE RECEIVED NO GROWTH TO DATE CULTURE WILL BE HELD FOR 5 DAYS BEFORE ISSUING A FINAL NEGATIVE REPORT     Performed at [facility]   Report Status PENDING   Incomplete  CULTURE, BLOOD (ROUTINE X 2)     Status: None   Collection Time    02/07/14  9:35 AM      Result Value Ref Range Status   Specimen Description BLOOD RIGHT ARM   Final   Special Requests BOTTLES DRAWN AEROBIC AND ANAEROBIC [facility] EACH   Final   Culture   Setup Time     Final   Value: 02/07/2014 17:44     Performed at [facility]   Culture     Final   Value:        BLOOD CULTURE RECEIVED NO GROWTH TO DATE CULTURE WILL BE HELD FOR 5 DAYS BEFORE ISSUING A FINAL NEGATIVE REPORT     Performed at [facility]   Report Status PENDING   Incomplete     Studies: Dg Chest Port 1 View  02/07/2014   CLINICAL DATA:  Dizziness.  EXAM: PORTABLE CHEST - 1 VIEW  COMPARISON:  May 17, 2013.  FINDINGS: The heart size and mediastinal contours are within normal limits. Status post coronary artery bypass graft. No pneumothorax or pleural effusion is noted. Both lungs are clear. The visualized skeletal structures are unremarkable.  IMPRESSION: No acute cardiopulmonary abnormality seen.   Electronically Signed   By: Sabino Dick M.D.   On: 02/07/2014 10:43    Scheduled Meds: . acarbose  100 mg Oral TID WC  . albuterol  2.5 mg Nebulization QHS  . aspirin EC  81 mg Oral Once per day on Mon Thu  . atorvastatin  10 mg Oral Daily  . carvedilol  6.25 mg Oral BID WC  . citalopram  10 mg Oral Daily  . glipiZIDE  10 mg Oral BID AC  . insulin aspart  0-15 Units Subcutaneous TID WC  . levothyroxine  50 mcg Oral QAC breakfast  . sodium chloride  3 mL Intravenous Q12H  . terbinafine   Topical BID   Continuous Infusions: . sodium chloride 75 mL/hr at 02/08/14 0830  . heparin 1,250 Units/hr (02/08/14 0947)    Active Problems:   Hypotension   Dehydration, moderate  Time spent: 58min  Khrystyne Arpin, [facility] Pager [phone]. If 7PM-7AM, please contact night-coverage at [email or web address], password [facility] 02/08/2014, 11:00 AM  LOS: 1 day

## 2014-02-08 NOTE — Care Management Note (Signed)
    Page 1 of 1   02/09/2014     2:25:26 PM CARE MANAGEMENT NOTE 02/09/2014  Patient:  LUCIEN, BUDNEY   Account Number:  [id number]  Date Initiated:  02/08/2014  Documentation initiated by:  GRAVES-BIGELOW,Cataleya Cristina  Subjective/Objective Assessment:   Pt admitted for hypotension.     Action/Plan:   CM will provide pt with 30 day free Eliquis card. Will ocnitnue to monitor for additional needs.   Anticipated DC Date:  02/09/2014   Anticipated DC Plan:  [place]  CM consult      Choice offered to / List presented to:             Status of service:  Completed, signed off Medicare Important Message given?   (If response is "NO", the following Medicare IM given date fields will be blank) Date Medicare IM given:   Medicare IM given by:   Date Additional Medicare IM given:   Additional Medicare IM given by:    Discharge Disposition:  HOME/SELF CARE  Per UR Regulation:  Reviewed for med. necessity/level of care/duration of stay  If discussed at [place] of Stay Meetings, dates discussed:    Comments:  02-09-14 [place], RN,BSN [phone] Pt provided eliquis card for 30 day free. No further needs from CM at this time.

## 2014-02-08 NOTE — Progress Notes (Signed)
  Echocardiogram 2D Echocardiogram has been performed.  Darlina Sicilian M 02/08/2014, 12:24 PM

## 2014-02-08 NOTE — Progress Notes (Signed)
ANTICOAGULATION CONSULT NOTE - Follow Up Consult  Pharmacy Consult for Heparin  Indication: atrial fibrillation  No Known Allergies  Patient Measurements: Height: 5\' 7"  (170.2 cm) Weight: 150 lb (68.04 kg) IBW/kg (Calculated) : 66.1  Vital Signs: Temp: 97.8 F (36.6 C) (08/24 1349) Temp src: Oral (08/24 1349) BP: 113/47 mmHg (08/24 1349) Pulse Rate: 91 (08/24 1349)  Labs:  Recent Labs  02/07/14 0925 02/07/14 1121 02/07/14 1305 02/07/14 1920  02/08/14 0005 02/08/14 0335 02/08/14 1300  HGB 11.9*  --  10.9*  --   --  10.1*  --   --   HCT 34.2*  --  32.9*  --   --  30.3*  --   --   PLT 258  --  230  --   --  221  --   --   HEPARINUNFRC  --   --   --   --   < > 0.22* 0.22* 0.65  CREATININE 1.11  --  0.97  --   --  0.94  --   --   TROPONINI  --  0.34*  --  0.30*  --  <0.30  --   --   < > = values in this interval not displayed.  Estimated Creatinine Clearance: 50.8 ml/min (by C-G formula based on Cr of 0.94).  Assessment: Heparin level now therapeutic  Goal of Therapy:  Heparin level 0.3-0.7 units/ml Monitor platelets by anticoagulation protocol: Yes   Plan:  -Continue heparin at current rate -Follow up for change to po  Thank you. Anette Guarneri, PharmD [phone]   02/08/2014,4:16 PM

## 2014-02-08 NOTE — Evaluation (Addendum)
Physical Therapy Evaluation Patient Details Name: [name] MRN: [id number] DOB: 05-03-1923 Today's Date: 02/08/2014   History of Present Illness  80 year old male with a history of coronary artery disease, NSTEMI, CABG. Comes to the ER with a chief complaint of feeling dizzy lightheaded. The patient was hypotensive upon arrival. Patient symptoms started  when the patient was mowing his neighbor's lawn.  Patient found to be in slow atrial fibrillation on his EKG which is new diagnosis for him.  Clinical Impression  Pt very pleasant gentleman who is moving well but noted to have gait and balance deviations. Pt encouraged to use cane and would benefit from acute therapy to maximize gait and balance to decrease fall risk. Pt with wife he assists with caring for and no other family support. Would attempt next gait trial with cane and pt shoe.     Follow Up Recommendations No PT follow up    Equipment Recommendations       Recommendations for Other Services       Precautions / Restrictions Precautions Precautions: Fall      Mobility  Bed Mobility               General bed mobility comments: pt on BSC on arrival  Transfers Overall transfer level: Modified independent                  Ambulation/Gait Ambulation/Gait assistance: Supervision Ambulation Distance (Feet): 250 Feet Assistive device: None Gait Pattern/deviations: Wide base of support;Decreased stride length;Step-through pattern   Gait velocity interpretation: at or above normal speed for age/gender General Gait Details: Pt with wide based gait, able to perform head turns but gait deficit remains and unable to narrow BOS. Pt educated for balance deficits and need to use DME.   Stairs Stairs: Yes Stairs assistance: Modified independent (Device/Increase time) Stair Management: One rail Right;Forwards;Alternating pattern Number of Stairs: 3    Wheelchair Mobility    Modified Rankin (Stroke Patients  Only)       Balance Overall balance assessment: Needs assistance   Sitting balance-Leahy Scale: Normal       Standing balance-Leahy Scale: Good                 High Level Balance Comments: pt reports no history of falls             Pertinent Vitals/Pain Pain Assessment: No/denies pain HR 118 throughout, no pain or dizziness    Home Living Family/patient expects to be discharged to:: Private residence Living Arrangements: Spouse/significant other Available Help at Discharge: Personal care attendant Type of Home: House Home Access: Stairs to enter;Ramped entrance Entrance Stairs-Rails: Right Entrance Stairs-Number of Steps: 3 Home Layout: One level Home Equipment: Cane - single point Additional Comments: Pt has WC and equipment for wife who is impaired due to MVA with brain injury. Wife has an aide daily who does the cooking and housework and cares for pt spouse    Prior Function Level of Independence: Independent         Comments: still drives, hx of tracheostomy     Hand Dominance        Extremity/Trunk Assessment   Upper Extremity Assessment: Overall WFL for tasks assessed           Lower Extremity Assessment: Overall WFL for tasks assessed      Cervical / Trunk Assessment: Normal  Communication   Communication: HOH  Cognition Arousal/Alertness: Awake/alert Behavior During Therapy: WFL for tasks assessed/performed Overall Cognitive Status: Within  Functional Limits for tasks assessed                      General Comments      Exercises        Assessment/Plan    PT Assessment Patient needs continued PT services  PT Diagnosis Abnormality of gait   PT Problem List Decreased balance;Decreased knowledge of use of DME  PT Treatment Interventions Gait training;DME instruction;Balance training;Functional mobility training;Patient/family education;Therapeutic activities   PT Goals (Current goals can be found in the Care Plan  section) Acute Rehab PT Goals Patient Stated Goal: return home PT Goal Formulation: With patient Time For Goal Achievement: 02/15/77 Potential to Achieve Goals: Good    Frequency Min 3X/week   Barriers to discharge Decreased caregiver support      Co-evaluation               End of Session   Activity Tolerance: Patient tolerated treatment well Patient left: in chair;with call bell/phone within reach;with chair alarm set Nurse Communication: Mobility status         Time: 3419-3790 PT Time Calculation (min): 15 min   Charges:   PT Evaluation $Initial PT Evaluation Tier I: 1 Procedure PT Treatments $Gait Training: 8-22 mins   PT G CodesMelford Aase 02/08/2014, 9:21 AM Elwyn Reach, [place]

## 2014-02-09 ENCOUNTER — Ambulatory Visit (INDEPENDENT_AMBULATORY_CARE_PROVIDER_SITE_OTHER): Payer: Medicare Other | Admitting: *Deleted

## 2014-02-09 DIAGNOSIS — I10 Essential (primary) hypertension: Secondary | ICD-10-CM

## 2014-02-09 DIAGNOSIS — L84 Corns and callosities: Secondary | ICD-10-CM

## 2014-02-09 DIAGNOSIS — E1149 Type 2 diabetes mellitus with other diabetic neurological complication: Secondary | ICD-10-CM

## 2014-02-09 DIAGNOSIS — M2040 Other hammer toe(s) (acquired), unspecified foot: Secondary | ICD-10-CM

## 2014-02-09 LAB — CBC
HCT: 35.3 % — ABNORMAL LOW (ref 39.0–52.0)
HEMOGLOBIN: 12 g/dL — AB (ref 13.0–17.0)
MCH: 32 pg (ref 26.0–34.0)
MCHC: 34 g/dL (ref 30.0–36.0)
MCV: 94.1 fL (ref 78.0–100.0)
Platelets: 231 10*3/uL (ref 150–400)
RBC: 3.75 MIL/uL — AB (ref 4.22–5.81)
RDW: 14 % (ref 11.5–15.5)
WBC: 9.5 10*3/uL (ref 4.0–10.5)

## 2014-02-09 LAB — GLUCOSE, CAPILLARY
Glucose-Capillary: 130 mg/dL — ABNORMAL HIGH (ref 70–99)
Glucose-Capillary: 164 mg/dL — ABNORMAL HIGH (ref 70–99)

## 2014-02-09 LAB — HEPARIN LEVEL (UNFRACTIONATED): HEPARIN UNFRACTIONATED: 0.66 [IU]/mL (ref 0.30–0.70)

## 2014-02-09 LAB — T4, FREE: Free T4: 1.2 ng/dL (ref 0.80–1.80)

## 2014-02-09 MED ORDER — FINASTERIDE 5 MG PO TABS
5.0000 mg | ORAL_TABLET | Freq: Every day | ORAL | Status: DC
  Filled 2014-02-09: qty 1

## 2014-02-09 MED ORDER — APIXABAN 5 MG PO TABS
5.0000 mg | ORAL_TABLET | Freq: Two times a day (BID) | ORAL | Status: DC
  Filled 2014-02-09 (×2): qty 1

## 2014-02-09 MED ORDER — FINASTERIDE 5 MG PO TABS: 5.0000 mg | ORAL_TABLET | Freq: Every day | ORAL | Status: DC

## 2014-02-09 MED ORDER — TERBINAFINE HCL 1 % EX CREA: TOPICAL_CREAM | Freq: Two times a day (BID) | CUTANEOUS | Status: DC

## 2014-02-09 MED ORDER — APIXABAN 5 MG PO TABS
5.0000 mg | ORAL_TABLET | Freq: Two times a day (BID) | ORAL | Status: DC
Start: 2014-02-09 — End: 2014-03-17

## 2014-02-09 MED ORDER — LEVOTHYROXINE SODIUM 75 MCG PO TABS: 75.0000 ug | ORAL_TABLET | Freq: Every day | ORAL | Status: DC

## 2014-02-09 MED ORDER — CARVEDILOL 6.25 MG PO TABS: 6.2500 mg | ORAL_TABLET | Freq: Two times a day (BID) | ORAL | Status: DC

## 2014-02-09 NOTE — Progress Notes (Signed)
Dispensed diabetic shoes and 3 pairs of insoles with instructions for wearing.  

## 2014-02-09 NOTE — Discharge Instructions (Signed)

## 2014-02-09 NOTE — Progress Notes (Signed)
Addendum to Physical Therapy progress note from 02-09-14  02/09/2014 0917  PT G-Codes **NOT FOR INPATIENT CLASS**  Functional Assessment Tool Used clinical judgement  Functional Limitation Mobility: Walking and moving around  Mobility: Walking and Moving Around Current Status [phone]) CI  Mobility: Walking and Moving Around Goal Status [phone]) [place], [place]

## 2014-02-09 NOTE — Patient Instructions (Signed)

## 2014-02-09 NOTE — Progress Notes (Signed)
OT Cancellation Note  Patient Details Name: [name] MRN: [id number] DOB: 04/29/1923   Cancelled Treatment:    Reason Eval/Treat Not Completed: Patient at procedure or test/ unavailable  [place], OTR/L  [phone] 02/09/2014 02/09/2014, 9:20 AM

## 2014-02-09 NOTE — Progress Notes (Signed)
Occupational Therapy Evaluation Patient Details Name: [name] MRN: [id number] DOB: 21-Oct-1922 Today's Date: 02/09/2014    History of Present Illness 80 year old male with a history of coronary artery disease, NSTEMI, CABG. Comes to the ER with a chief complaint of feeling dizzy lightheaded. The patient was hypotensive upon arrival. Patient symptoms started  when the patient was mowing his neighbor's lawn.  Patient found to be in slow atrial fibrillation on his EKG which is new diagnosis for him.   Clinical Impression   Pt appears to be at his baseline regarding his functional level. Pt appropriate for D/C home when medically stable. No DME needs. OT signing off. Recommend pt ambulate with staff with S    Follow Up Recommendations  No OT follow up;Supervision - Intermittent    Equipment Recommendations  None recommended by OT    Recommendations for Other Services  none     Precautions / Restrictions Precautions Precautions: Fall      Mobility Bed Mobility                  Transfers Overall transfer level: Modified independent                    Balance Overall balance assessment: No apparent balance deficits (not formally assessed)                                          ADL Overall ADL's : At baseline                                             Vision    wears glasses                 Perception     Praxis      Pertinent Vitals/Pain Pain Assessment: No/denies pain     Hand Dominance Right   Extremity/Trunk Assessment Upper Extremity Assessment Upper Extremity Assessment: Overall WFL for tasks assessed   Lower Extremity Assessment Lower Extremity Assessment: Overall WFL for tasks assessed   Cervical / Trunk Assessment Cervical / Trunk Assessment: Normal   Communication Communication Communication: HOH   Cognition Arousal/Alertness: Awake/alert Behavior During Therapy: WFL for tasks  assessed/performed Overall Cognitive Status: Within Functional Limits for tasks assessed                     General Comments   Wife has PCA    Exercises       Shoulder Instructions      Home Living Family/patient expects to be discharged to:: Private residence Living Arrangements: Spouse/significant other Available Help at Discharge: Personal care attendant Type of Home: House Home Access: Stairs to enter;Ramped entrance Entrance Stairs-Number of Steps: 3 Entrance Stairs-Rails: Right Home Layout: One level     Bathroom Shower/Tub: Tub/shower unit Shower/tub characteristics: Architectural technologist: Standard Bathroom Accessibility: Yes How Accessible: Accessible via walker Home Equipment: Cane - single point          Prior Functioning/Environment Level of Independence: Independent        Comments: still drives, hx of tracheostomy    OT Diagnosis:     OT Problem List:     OT Treatment/Interventions:      OT Goals(Current goals can be found in the  care plan section) Acute Rehab OT Goals Patient Stated Goal: return home  OT Frequency:     Barriers to D/C:            Co-evaluation              End of Session Equipment Utilized During Treatment: Gait belt Nurse Communication: Mobility status  Activity Tolerance: Patient tolerated treatment well Patient left: in chair;with call bell/phone within reach;with chair alarm set   Time: 1000-1018 OT Time Calculation (min): 18 min Charges:  OT General Charges $OT Visit: 1 Procedure OT Evaluation $Initial OT Evaluation Tier I: 1 Procedure OT Treatments $Self Care/Home Management : 8-22 mins G-Codes:    Jazleen Robeck,HILLARY 03-Mar-2014, 10:21 AM   Maurie Boettcher, OTR/L  [phone] Mar 03, 2014

## 2014-02-09 NOTE — Progress Notes (Signed)
ANTICOAGULATION CONSULT NOTE - Follow Up Consult  Pharmacy Consult for Heparin  Indication: atrial fibrillation  No Known Allergies  Patient Measurements: Height: 5\' 7"  (176.1 cm) Weight: 147 lb 1.6 oz (66.724 kg) IBW/kg (Calculated) : 66.1  Vital Signs: Temp: 97.3 F (36.3 C) (08/25 0436) Temp src: Oral (08/25 0436) BP: 124/64 mmHg (08/25 0436) Pulse Rate: 109 (08/25 0436)  Labs:  Recent Labs  02/07/14 0925 02/07/14 1121 02/07/14 1305 02/07/14 1920  02/08/14 0005 02/08/14 0335 02/08/14 1300 02/09/14 0539  HGB 11.9*  --  10.9*  --   --  10.1*  --   --  12.0*  HCT 34.2*  --  32.9*  --   --  30.3*  --   --  35.3*  PLT 258  --  230  --   --  221  --   --  231  HEPARINUNFRC  --   --   --   --   < > 0.22* 0.22* 0.65 0.66  CREATININE 1.11  --  0.97  --   --  0.94  --   --   --   TROPONINI  --  0.34*  --  0.30*  --  <0.30  --   --   --   < > = values in this interval not displayed.  Estimated Creatinine Clearance: 50.8 ml/min (by C-G formula based on Cr of 0.94).  Assessment: Heparin level therapeutic  Goal of Therapy:  Heparin level 0.3-0.7 units/ml Monitor platelets by anticoagulation protocol: Yes   Plan:  -Continue heparin at current rate -Follow up for change to po Eliquis  Thank you. Anette Guarneri, PharmD [phone]   02/09/2014,10:39 AM

## 2014-02-09 NOTE — Discharge Summary (Signed)
Physician Discharge Summary  [name] LKG:[id number] DOB: Aug 24, 1922 DOA: 02/07/2014  PCP: Mathews Argyle, MD  Admit date: 02/07/2014 Discharge date: 02/09/2014  Time spent: 35 minutes  Recommendations for Outpatient Follow-up:  1. Follow up with PCP in 1-2 weeks 2. Follow up with Dr. Caryl Comes as scheduled 3. Anticipate DCCV in 3-4 weeks per Dr. Caryl Comes 4. Please call Merry Proud or other sons at [phone] for appointments 5. Levothyroxine dose increased, would repeat TSH in 4-6 weeks  Discharge Diagnoses:  Active Problems:   Hypotension   Dehydration, moderate   Discharge Condition: Stable  Diet recommendation: Regular  Filed Weights   02/07/14 2012 02/08/14 0400 02/09/14 0436  Weight: 68.2 kg (150 lb 5.7 oz) 68.04 kg (150 lb) 66.724 kg (147 lb 1.6 oz)    History of present illness:  See admit h and p from 8/23 for details. Briefly, pt presents with dizziness and hypotension, found to be in new onset afib. Pt was admitted for further work up.  Hospital Course:  New onset atrial fibrillation  -Symptoms began one day prior to admit  -Currently rate controlled  -Started low-dose Coreg  -Anticipated DCCV in 3-4 weeks per EP if stable  CHADSVASc 5  - He would need long-term anticoagulation  - Therefore we'll start the patient on heparin drip  - Troponin serially neg  - TSH 8.24  - Continue aspirin  - Electrophysiology consult by Dr. Caryl Comes  Dyslipidemia  - We'll check a lipid panel continue statin  Diabetes mellitus  - Continue Glucotrol  - On SSI and hemoglobin A1c  Hypertension  - holding off on antihypertensive medications until seen by cardiology  Hypothyroidism  -Continue Synthroid but increase dose to 45mcg -TSH elevated - will check free T4, still pending Onychomycosis  -Started the patient on Lamisil  Consultations:  Cardiology  Discharge Exam: Filed Vitals:   02/08/14 1349 02/08/14 1923 02/08/14 2048 02/09/14 0436  BP: 113/47  132/63 124/64   Pulse: 91  86 109  Temp: 97.8 F (36.6 C)  98.1 F (36.7 C) 97.3 F (36.3 C)  TempSrc: Oral  Oral Oral  Resp: 18  18 18   Height:      Weight:    66.724 kg (147 lb 1.6 oz)  SpO2: 100% 99% 100% 99%    General: Awake, in nad Cardiovascular: regular, s1, s2 Respiratory: normal resp effort, no wheezing  Discharge Instructions     Medication List         acarbose 100 MG tablet  Commonly known as:  PRECOSE  Take 100 mg by mouth 3 (three) times daily with meals.     albuterol 108 (90 BASE) MCG/ACT inhaler  Commonly known as:  PROVENTIL HFA;VENTOLIN HFA  Inhale 2-3 puffs into the lungs at bedtime.     apixaban 5 MG Tabs tablet  Commonly known as:  ELIQUIS  Take 1 tablet (5 mg total) by mouth 2 (two) times daily.     aspirin EC 81 MG tablet  Take 81 mg by mouth 2 (two) times a week.     atorvastatin 10 MG tablet  Commonly known as:  LIPITOR  Take 10 mg by mouth daily.     carvedilol 6.25 MG tablet  Commonly known as:  COREG  Take 1 tablet (6.25 mg total) by mouth 2 (two) times daily with a meal.     citalopram 10 MG tablet  Commonly known as:  CELEXA  Take 10 mg by mouth daily.     ferrous sulfate 325 (65  FE) MG tablet  Take 325 mg by mouth daily with breakfast.     finasteride 5 MG tablet  Commonly known as:  PROSCAR  Take 5 mg by mouth daily.     finasteride 5 MG tablet  Commonly known as:  PROSCAR  Take 1 tablet (5 mg total) by mouth daily.     fluticasone 50 MCG/ACT nasal spray  Commonly known as:  FLONASE  Place 2 sprays into both nostrils daily as needed for rhinitis or allergies.     glipiZIDE 10 MG tablet  Commonly known as:  GLUCOTROL  Take 10 mg by mouth 2 (two) times daily before a meal.     levothyroxine 75 MCG tablet  Commonly known as:  SYNTHROID  Take 1 tablet (75 mcg total) by mouth daily before breakfast.     lisinopril-hydrochlorothiazide 20-12.5 MG per tablet  Commonly known as:  PRINZIDE,ZESTORETIC  Take 1 tablet by mouth 2 (two)  times daily.     metFORMIN 500 MG tablet  Commonly known as:  GLUCOPHAGE  Take 2 tablets (1,000 mg total) by mouth 2 (two) times daily with a meal.     terbinafine 1 % cream  Commonly known as:  LAMISIL  Apply topically 2 (two) times daily.       No Known Allergies Follow-up Information   Follow up with Mathews Argyle, MD. Schedule an appointment as soon as possible for a visit in 1 week.   Specialty:  Internal Medicine   Contact information:   [address] [place] [address] [phone]       Follow up with Virl Axe, MD. (as scheduled)    Specialty:  Cardiology   Contact information:   [address] [place] [address] [phone]        The results of significant diagnostics from this hospitalization (including imaging, microbiology, ancillary and laboratory) are listed below for reference.    Significant Diagnostic Studies: Dg Chest Port 1 View  02/07/2014   CLINICAL DATA:  Dizziness.  EXAM: PORTABLE CHEST - 1 VIEW  COMPARISON:  May 17, 2013.  FINDINGS: The heart size and mediastinal contours are within normal limits. Status post coronary artery bypass graft. No pneumothorax or pleural effusion is noted. Both lungs are clear. The visualized skeletal structures are unremarkable.  IMPRESSION: No acute cardiopulmonary abnormality seen.   Electronically Signed   By: Sabino Dick M.D.   On: 02/07/2014 10:43    Microbiology: Recent Results (from the past 240 hour(s))  CULTURE, BLOOD (ROUTINE X 2)     Status: None   Collection Time    02/07/14  9:25 AM      Result Value Ref Range Status   Specimen Description BLOOD RIGHT WRIST   Final   Special Requests BOTTLES DRAWN AEROBIC AND ANAEROBIC 1CC EACH   Final   Culture  Setup Time     Final   Value: 02/07/2014 17:45     Performed at [facility]   Culture     Final   Value:        BLOOD CULTURE RECEIVED NO GROWTH TO DATE CULTURE WILL BE HELD FOR 5 DAYS BEFORE  ISSUING A FINAL NEGATIVE REPORT     Performed at [facility]   Report Status PENDING   Incomplete  URINE CULTURE     Status: None   Collection Time    02/07/14  9:28 AM      Result Value Ref Range  Status   Specimen Description URINE, CATHETERIZED   Final   Special Requests ADD [id number]   Final   Culture  Setup Time     Final   Value: 02/07/2014 17:49     Performed at [facility]     Final   Value: NO GROWTH     Performed at [facility]   Culture     Final   Value: NO GROWTH     Performed at [facility]   Report Status 02/08/2014 FINAL   Final  CULTURE, BLOOD (ROUTINE X 2)     Status: None   Collection Time    02/07/14  9:35 AM      Result Value Ref Range Status   Specimen Description BLOOD RIGHT ARM   Final   Special Requests BOTTLES DRAWN AEROBIC AND ANAEROBIC [facility] EACH   Final   Culture  Setup Time     Final   Value: 02/07/2014 17:44     Performed at [facility]   Culture     Final   Value:        BLOOD CULTURE RECEIVED NO GROWTH TO DATE CULTURE WILL BE HELD FOR 5 DAYS BEFORE ISSUING A FINAL NEGATIVE REPORT     Performed at [facility]   Report Status PENDING   Incomplete     Labs: Basic Metabolic Panel:  Recent Labs Lab 02/07/14 0925 02/07/14 1305 02/08/14 0005  NA 138  --  141  K 4.3  --  4.0  CL 102  --  108  CO2 23  --  24  GLUCOSE 146*  --  156*  BUN 24*  --  24*  CREATININE 1.11 0.97 0.94  CALCIUM 8.7  --  7.9*  MG  --  1.3*  --    Liver Function Tests:  Recent Labs Lab 02/07/14 0925 02/07/14 1305 02/08/14 0005  AST 14 13 12   ALT 8 8 7   ALKPHOS 51 49 46  BILITOT 0.5 0.5 0.2*  PROT 6.0 5.5* 5.3*  ALBUMIN 3.3* 3.1* 2.9*   No results found for this basename: LIPASE, AMYLASE,  in the last 168 hours No results found for this basename: AMMONIA,  in the last 168 hours CBC:  Recent Labs Lab 02/07/14 0925 02/07/14 1305 02/08/14 0005 02/09/14 0539  WBC 6.6 5.7 5.0 9.5  HGB  11.9* 10.9* 10.1* 12.0*  HCT 34.2* 32.9* 30.3* 35.3*  MCV 92.4 92.9 92.9 94.1  PLT 258 230 221 231   Cardiac Enzymes:  Recent Labs Lab 02/07/14 1121 02/07/14 1920 02/08/14 0005  TROPONINI 0.34* 0.30* <0.30   BNP: BNP (last 3 results)  Recent Labs  05/17/13 1825  PROBNP 983.2*   CBG:  Recent Labs Lab 02/08/14 0815 02/08/14 1347 02/08/14 1715 02/08/14 2047 02/09/14 0759  GLUCAP 146* 164* 104* 104* 130*    Signed:  Williams Dietrick K  [facility] 02/09/2014, 11:37 AM

## 2014-02-12 ENCOUNTER — Emergency Department (HOSPITAL_COMMUNITY): Payer: Medicare Other

## 2014-02-12 ENCOUNTER — Inpatient Hospital Stay (HOSPITAL_COMMUNITY)
Admission: EM | Admit: 2014-02-12 | Discharge: 2014-02-14 | DRG: 292 | Disposition: A | Discharge status: Home / Self Care | Payer: Medicare Other | Attending: Internal Medicine | Admitting: Internal Medicine

## 2014-02-12 ENCOUNTER — Encounter (HOSPITAL_COMMUNITY): Payer: Self-pay | Admitting: Emergency Medicine

## 2014-02-12 DIAGNOSIS — I10 Essential (primary) hypertension: Secondary | ICD-10-CM | POA: Diagnosis present

## 2014-02-12 DIAGNOSIS — I359 Nonrheumatic aortic valve disorder, unspecified: Secondary | ICD-10-CM

## 2014-02-12 DIAGNOSIS — I252 Old myocardial infarction: Secondary | ICD-10-CM

## 2014-02-12 DIAGNOSIS — Z930 Tracheostomy status: Secondary | ICD-10-CM

## 2014-02-12 DIAGNOSIS — Z7982 Long term (current) use of aspirin: Secondary | ICD-10-CM

## 2014-02-12 DIAGNOSIS — I4891 Unspecified atrial fibrillation: Secondary | ICD-10-CM | POA: Diagnosis present

## 2014-02-12 DIAGNOSIS — I509 Heart failure, unspecified: Secondary | ICD-10-CM | POA: Diagnosis present

## 2014-02-12 DIAGNOSIS — K219 Gastro-esophageal reflux disease without esophagitis: Secondary | ICD-10-CM | POA: Diagnosis present

## 2014-02-12 DIAGNOSIS — E119 Type 2 diabetes mellitus without complications: Secondary | ICD-10-CM | POA: Diagnosis present

## 2014-02-12 DIAGNOSIS — I447 Left bundle-branch block, unspecified: Secondary | ICD-10-CM | POA: Diagnosis present

## 2014-02-12 DIAGNOSIS — I739 Peripheral vascular disease, unspecified: Secondary | ICD-10-CM | POA: Diagnosis present

## 2014-02-12 DIAGNOSIS — G609 Hereditary and idiopathic neuropathy, unspecified: Secondary | ICD-10-CM | POA: Diagnosis present

## 2014-02-12 DIAGNOSIS — C140 Malignant neoplasm of pharynx, unspecified: Secondary | ICD-10-CM | POA: Diagnosis present

## 2014-02-12 DIAGNOSIS — I2589 Other forms of chronic ischemic heart disease: Secondary | ICD-10-CM | POA: Diagnosis present

## 2014-02-12 DIAGNOSIS — I4892 Unspecified atrial flutter: Secondary | ICD-10-CM | POA: Diagnosis present

## 2014-02-12 DIAGNOSIS — E876 Hypokalemia: Secondary | ICD-10-CM | POA: Diagnosis present

## 2014-02-12 DIAGNOSIS — I25810 Atherosclerosis of coronary artery bypass graft(s) without angina pectoris: Secondary | ICD-10-CM | POA: Diagnosis present

## 2014-02-12 DIAGNOSIS — I079 Rheumatic tricuspid valve disease, unspecified: Secondary | ICD-10-CM | POA: Diagnosis present

## 2014-02-12 DIAGNOSIS — Z85819 Personal history of malignant neoplasm of unspecified site of lip, oral cavity, and pharynx: Secondary | ICD-10-CM | POA: Diagnosis not present

## 2014-02-12 DIAGNOSIS — I5023 Acute on chronic systolic (congestive) heart failure: Principal | ICD-10-CM | POA: Diagnosis present

## 2014-02-12 DIAGNOSIS — Z87891 Personal history of nicotine dependence: Secondary | ICD-10-CM

## 2014-02-12 DIAGNOSIS — H9190 Unspecified hearing loss, unspecified ear: Secondary | ICD-10-CM | POA: Diagnosis present

## 2014-02-12 DIAGNOSIS — R0600 Dyspnea, unspecified: Secondary | ICD-10-CM | POA: Diagnosis present

## 2014-02-12 DIAGNOSIS — E139 Other specified diabetes mellitus without complications: Secondary | ICD-10-CM

## 2014-02-12 DIAGNOSIS — E785 Hyperlipidemia, unspecified: Secondary | ICD-10-CM | POA: Diagnosis present

## 2014-02-12 DIAGNOSIS — E039 Hypothyroidism, unspecified: Secondary | ICD-10-CM | POA: Diagnosis present

## 2014-02-12 DIAGNOSIS — E089 Diabetes mellitus due to underlying condition without complications: Secondary | ICD-10-CM

## 2014-02-12 LAB — BASIC METABOLIC PANEL
Anion gap: 14 (ref 5–15)
BUN: 15 mg/dL (ref 6–23)
CHLORIDE: 102 meq/L (ref 96–112)
CO2: 23 meq/L (ref 19–32)
CREATININE: 0.96 mg/dL (ref 0.50–1.35)
Calcium: 8.2 mg/dL — ABNORMAL LOW (ref 8.4–10.5)
GFR calc Af Amer: 83 mL/min — ABNORMAL LOW (ref >90)
GFR calc non Af Amer: 72 mL/min — ABNORMAL LOW (ref >90)
Glucose, Bld: 128 mg/dL — ABNORMAL HIGH (ref 70–99)
POTASSIUM: 4.1 meq/L (ref 3.7–5.3)
Sodium: 139 mEq/L (ref 137–147)

## 2014-02-12 LAB — CBC
HCT: 33.7 % — ABNORMAL LOW (ref 39.0–52.0)
Hemoglobin: 11.4 g/dL — ABNORMAL LOW (ref 13.0–17.0)
MCH: 32.2 pg (ref 26.0–34.0)
MCHC: 33.8 g/dL (ref 30.0–36.0)
MCV: 95.2 fL (ref 78.0–100.0)
Platelets: 227 10*3/uL (ref 150–400)
RBC: 3.54 MIL/uL — ABNORMAL LOW (ref 4.22–5.81)
RDW: 14.3 % (ref 11.5–15.5)
WBC: 8.5 10*3/uL (ref 4.0–10.5)

## 2014-02-12 LAB — GLUCOSE, CAPILLARY
GLUCOSE-CAPILLARY: 167 mg/dL — AB (ref 70–99)
GLUCOSE-CAPILLARY: 225 mg/dL — AB (ref 70–99)
Glucose-Capillary: 176 mg/dL — ABNORMAL HIGH (ref 70–99)

## 2014-02-12 LAB — MAGNESIUM: Magnesium: 1.2 mg/dL — ABNORMAL LOW (ref 1.5–2.5)

## 2014-02-12 LAB — TROPONIN I: Troponin I: <0.3 ng/mL (ref <0.30)

## 2014-02-12 LAB — PRO B NATRIURETIC PEPTIDE: Pro B Natriuretic peptide (BNP): 9440 pg/mL — ABNORMAL HIGH (ref 0–450)

## 2014-02-12 MED ORDER — ONDANSETRON HCL 4 MG/2ML IJ SOLN: 4.0000 mg | Freq: Four times a day (QID) | INTRAMUSCULAR | Status: DC | PRN

## 2014-02-12 MED ORDER — FLUTICASONE PROPIONATE 50 MCG/ACT NA SUSP
2.0000 | Freq: Every day | NASAL | Status: DC | PRN
Start: 2014-02-12 — End: 2014-02-14
  Filled 2014-02-12: qty 16

## 2014-02-12 MED ORDER — ATORVASTATIN CALCIUM 10 MG PO TABS
10.0000 mg | ORAL_TABLET | Freq: Every day | ORAL | Status: DC
  Administered 2014-02-12 – 2014-02-13 (×2): 10 mg via ORAL
  Filled 2014-02-12 (×3): qty 1

## 2014-02-12 MED ORDER — HYDROCHLOROTHIAZIDE 12.5 MG PO CAPS
12.5000 mg | ORAL_CAPSULE | Freq: Every day | ORAL | Status: DC
  Administered 2014-02-12 – 2014-02-13 (×2): 12.5 mg via ORAL
  Filled 2014-02-12 (×2): qty 1

## 2014-02-12 MED ORDER — INSULIN ASPART 100 UNIT/ML ~~LOC~~ SOLN
0.0000 [IU] | Freq: Three times a day (TID) | SUBCUTANEOUS | Status: DC
  Administered 2014-02-12 – 2014-02-13 (×2): 2 [IU] via SUBCUTANEOUS
  Administered 2014-02-13 – 2014-02-14 (×4): 3 [IU] via SUBCUTANEOUS

## 2014-02-12 MED ORDER — ACARBOSE 100 MG PO TABS
100.0000 mg | ORAL_TABLET | Freq: Three times a day (TID) | ORAL | Status: DC
  Administered 2014-02-12 – 2014-02-14 (×6): 100 mg via ORAL
  Filled 2014-02-12 (×8): qty 1

## 2014-02-12 MED ORDER — ACETAMINOPHEN 650 MG RE SUPP: 650.0000 mg | Freq: Four times a day (QID) | RECTAL | Status: DC | PRN

## 2014-02-12 MED ORDER — ACETAMINOPHEN 325 MG PO TABS: 650.0000 mg | ORAL_TABLET | Freq: Four times a day (QID) | ORAL | Status: DC | PRN

## 2014-02-12 MED ORDER — LABETALOL HCL 5 MG/ML IV SOLN: 10.0000 mg | Freq: Once | INTRAVENOUS | Status: DC

## 2014-02-12 MED ORDER — FUROSEMIDE 10 MG/ML IJ SOLN
40.0000 mg | Freq: Two times a day (BID) | INTRAMUSCULAR | Status: DC
  Administered 2014-02-12 – 2014-02-14 (×4): 40 mg via INTRAVENOUS
  Filled 2014-02-12 (×7): qty 4

## 2014-02-12 MED ORDER — ONDANSETRON HCL 4 MG PO TABS: 4.0000 mg | ORAL_TABLET | Freq: Four times a day (QID) | ORAL | Status: DC | PRN

## 2014-02-12 MED ORDER — CITALOPRAM HYDROBROMIDE 10 MG PO TABS
10.0000 mg | ORAL_TABLET | Freq: Every day | ORAL | Status: DC
  Administered 2014-02-12 – 2014-02-14 (×3): 10 mg via ORAL
  Filled 2014-02-12 (×3): qty 1

## 2014-02-12 MED ORDER — ASPIRIN EC 81 MG PO TBEC
81.0000 mg | DELAYED_RELEASE_TABLET | ORAL | Status: DC
  Filled 2014-02-12: qty 1

## 2014-02-12 MED ORDER — MAGNESIUM SULFATE 40 MG/ML IJ SOLN
2.0000 g | INTRAMUSCULAR | Status: AC
  Administered 2014-02-12: 2 g via INTRAVENOUS
  Filled 2014-02-12: qty 50

## 2014-02-12 MED ORDER — FINASTERIDE 5 MG PO TABS
5.0000 mg | ORAL_TABLET | Freq: Every day | ORAL | Status: DC
  Administered 2014-02-12 – 2014-02-14 (×3): 5 mg via ORAL
  Filled 2014-02-12 (×3): qty 1

## 2014-02-12 MED ORDER — CARVEDILOL 6.25 MG PO TABS
6.2500 mg | ORAL_TABLET | Freq: Two times a day (BID) | ORAL | Status: DC
  Administered 2014-02-12 – 2014-02-13 (×2): 6.25 mg via ORAL
  Filled 2014-02-12 (×4): qty 1

## 2014-02-12 MED ORDER — APIXABAN 5 MG PO TABS
5.0000 mg | ORAL_TABLET | Freq: Two times a day (BID) | ORAL | Status: DC
  Administered 2014-02-12 – 2014-02-14 (×4): 5 mg via ORAL
  Filled 2014-02-12 (×5): qty 1

## 2014-02-12 MED ORDER — LISINOPRIL-HYDROCHLOROTHIAZIDE 20-12.5 MG PO TABS: 1.0000 | ORAL_TABLET | Freq: Two times a day (BID) | ORAL | Status: DC

## 2014-02-12 MED ORDER — ALBUTEROL SULFATE (2.5 MG/3ML) 0.083% IN NEBU
3.0000 mL | INHALATION_SOLUTION | Freq: Four times a day (QID) | RESPIRATORY_TRACT | Status: DC | PRN
Start: 2014-02-12 — End: 2014-02-14

## 2014-02-12 MED ORDER — FUROSEMIDE 10 MG/ML IJ SOLN
20.0000 mg | Freq: Once | INTRAMUSCULAR | Status: AC
  Administered 2014-02-12: 20 mg via INTRAVENOUS
  Filled 2014-02-12: qty 2

## 2014-02-12 MED ORDER — LISINOPRIL 20 MG PO TABS
20.0000 mg | ORAL_TABLET | Freq: Every day | ORAL | Status: DC
  Administered 2014-02-12 – 2014-02-13 (×2): 20 mg via ORAL
  Filled 2014-02-12 (×2): qty 1

## 2014-02-12 MED ORDER — LEVOTHYROXINE SODIUM 75 MCG PO TABS
75.0000 ug | ORAL_TABLET | Freq: Every day | ORAL | Status: DC
  Administered 2014-02-13 – 2014-02-14 (×2): 75 ug via ORAL
  Filled 2014-02-12 (×4): qty 1

## 2014-02-12 NOTE — ED Notes (Signed)
Admitting MD at bedside.

## 2014-02-12 NOTE — H&P (Addendum)
[facility] History and Physical  [name] STM:[id number] DOB: September 05, 1922 DOA: 02/12/2014  Referring physician: EDP PCP: Mathews Argyle, MD   Chief Complaint: shortness of breath  HPI: [name] is a 80 y.o. male with PMH of CAD s/p CABG, ICM EF of 25%, DM, remote h/o throat CA/trach, h/o RAS s/p stents, Recent AFib started on Apixaban, coreg and supposed to FU with cards for Cardioversion, just discharged from [facility] 8/25. Presents to the ER today with dyspnea ongoing since yesterday night with some orthopnea. In ER CXR with CHF and BNP >9K. Cards consulted who recommended admission per [facility]    Review of Systems: positives bolded Constitutional:  No weight loss, night sweats, Fevers, chills, fatigue.  HEENT:  No headaches, Difficulty swallowing,Tooth/dental problems,Sore throat,  No sneezing, itching, ear ache, nasal congestion, post nasal drip,  Cardio-vascular:  No chest pain, Orthopnea, PND, swelling in lower extremities, anasarca, dizziness, palpitations  GI:  No heartburn, indigestion, abdominal pain, nausea, vomiting, diarrhea, change in bowel habits, loss of appetite  Resp:  shortness of breath with exertion or at rest. No excess mucus, no productive cough, No non-productive cough, No coughing up of blood.No change in color of mucus.No wheezing.No chest wall deformity  Skin:  no rash or lesions.  GU:  no dysuria, change in color of urine, no urgency or frequency. No flank pain.  Musculoskeletal:  No joint pain or swelling. No decreased range of motion. No back pain.  Psych:  No change in mood or affect. No depression or anxiety. No memory loss.   Past Medical History  Diagnosis Date  . Coronary artery disease     s/p CABG 1996 then PCI of SVG to OM 2007  . Diverticulosis   . Dyslipidemia   . Renal artery stenosis     s/p bilateral stents  . Hiatal hernia   . Colon polyps   . Carotid artery stenosis     50% BILATERAL 2014  . Peripheral  neuropathy   . Anemia   . Hypothyroidism   . Nasal polyps   . Hypertension   . Diabetes mellitus   . Throat cancer S/P TRACHECTOMY   . Myocardial infarction   . Peripheral vascular disease     LOWER EXT  . Acute myocardial infarction, unspecified site, initial episode of care   . Atrial fibrillation/flutter   . GERD (gastroesophageal reflux disease)   . Shortness of breath     Hx: of  . HOH (hard of hearing) VERY    Past Surgical History  Procedure Laterality Date  . Coronary artery bypass graft      1996  . Cardiac catheterization      PCI of SVG to OM 2007  . Renal artery stent    . Tracheostomy    . Appendectomy    . Tonsillectomy     Social History:  reports that he quit smoking about 67 years ago. His smoking use included Cigarettes. He smoked 0.30 packs per day. He does not have any smokeless tobacco history on file. He reports that he does not drink alcohol or use illicit drugs.  No Known Allergies  Family History  Problem Relation Age of Onset  . Hypertension Mother   . Heart attack Brother   . Diabetes Son      Prior to Admission medications   Medication Sig Start Date End Date Taking? Authorizing Provider  acarbose (PRECOSE) 100 MG tablet Take 100 mg by mouth 3 (three) times daily with meals.  Yes Historical Provider, MD  albuterol (PROVENTIL HFA;VENTOLIN HFA) 108 (90 BASE) MCG/ACT inhaler Inhale 2-3 puffs into the lungs at bedtime.    Yes Historical Provider, MD  apixaban (ELIQUIS) 5 MG TABS tablet Take 1 tablet (5 mg total) by mouth 2 (two) times daily. 02/09/14  Yes Donne Hazel, MD  aspirin EC 81 MG tablet Take 81 mg by mouth 2 (two) times a week.   Yes Historical Provider, MD  atorvastatin (LIPITOR) 10 MG tablet Take 10 mg by mouth daily.   Yes Historical Provider, MD  carvedilol (COREG) 6.25 MG tablet Take 1 tablet (6.25 mg total) by mouth 2 (two) times daily with a meal. 02/09/14  Yes Donne Hazel, MD  citalopram (CELEXA) 10 MG tablet Take 10 mg by  mouth daily.   Yes Historical Provider, MD  ferrous sulfate 325 (65 FE) MG tablet Take 325 mg by mouth daily with breakfast.   Yes Historical Provider, MD  finasteride (PROSCAR) 5 MG tablet Take 5 mg by mouth daily.    Yes Historical Provider, MD  fluticasone (FLONASE) 50 MCG/ACT nasal spray Place 2 sprays into both nostrils daily as needed for rhinitis or allergies.    Yes Historical Provider, MD  glipiZIDE (GLUCOTROL) 10 MG tablet Take 10 mg by mouth 2 (two) times daily before a meal.    Yes Historical Provider, MD  levothyroxine (SYNTHROID) 75 MCG tablet Take 1 tablet (75 mcg total) by mouth daily before breakfast. 02/09/14  Yes Donne Hazel, MD  lisinopril-hydrochlorothiazide (PRINZIDE,ZESTORETIC) 20-12.5 MG per tablet Take 1 tablet by mouth 2 (two) times daily. 10/13/13  Yes [name], MD  metFORMIN (GLUCOPHAGE) 500 MG tablet Take 2 tablets (1,000 mg total) by mouth 2 (two) times daily with a meal. 11/09/12  Yes [name], MD  terbinafine (LAMISIL) 1 % cream Apply topically 2 (two) times daily. 02/09/14  Yes Donne Hazel, MD   Physical Exam: Filed Vitals:   02/12/14 1036 02/12/14 1100 02/12/14 1112 02/12/14 1130  BP: 146/93 122/62 122/62 128/57  Pulse: 92 96  84  Temp:      TempSrc:      Resp: 18 0 24 25  SpO2: 98% 98% 99% 97%    Wt Readings from Last 3 Encounters:  02/09/14 66.724 kg (147 lb 1.6 oz)  08/20/13 67.586 kg (149 lb)  07/07/13 69.174 kg (152 lb 8 oz)    General:  Appears calm and comfortable, AAOx3 Eyes: PERRL, normal lids, irises & conjunctiva ENT: grossly normal lips & tongue Neck: trach site clean, no masses or thyromegaly Cardiovascular: Irregular rate and rhythm, no m/r/g. No LE edema. Respiratory: crackles at both bases. Normal respiratory effort. Abdomen: soft, ntnd Skin: no rash or induration seen on limited exam Musculoskeletal: grossly normal tone BUE/BLE Psychiatric: grossly normal mood and affect, speech fluent and  appropriate Neurologic: grossly non-focal.          Labs on Admission:  Basic Metabolic Panel:  Recent Labs Lab 02/07/14 0925 02/07/14 1305 02/08/14 0005 02/12/14 0728  NA 138  --  141 139  K 4.3  --  4.0 4.1  CL 102  --  108 102  CO2 23  --  24 23  GLUCOSE 146*  --  156* 128*  BUN 24*  --  24* 15  CREATININE 1.11 0.97 0.94 0.96  CALCIUM 8.7  --  7.9* 8.2*  MG  --  1.3*  --  1.2*   Liver Function Tests:  Recent Labs  Lab 02/07/14 0925 02/07/14 1305 02/08/14 0005  AST 14 13 12   ALT 8 8 7   ALKPHOS 51 49 46  BILITOT 0.5 0.5 0.2*  PROT 6.0 5.5* 5.3*  ALBUMIN 3.3* 3.1* 2.9*   No results found for this basename: LIPASE, AMYLASE,  in the last 168 hours No results found for this basename: AMMONIA,  in the last 168 hours CBC:  Recent Labs Lab 02/07/14 0925 02/07/14 1305 02/08/14 0005 02/09/14 0539 02/12/14 0728  WBC 6.6 5.7 5.0 9.5 8.5  HGB 11.9* 10.9* 10.1* 12.0* 11.4*  HCT 34.2* 32.9* 30.3* 35.3* 33.7*  MCV 92.4 92.9 92.9 94.1 95.2  PLT 258 230 221 231 227   Cardiac Enzymes:  Recent Labs Lab 02/07/14 1121 02/07/14 1920 02/08/14 0005 02/12/14 0728  TROPONINI 0.34* 0.30* <0.30 <0.30    BNP (last 3 results)  Recent Labs  05/17/13 1825 02/12/14 0728  PROBNP 983.2* 9440.0*   CBG:  Recent Labs Lab 02/08/14 1347 02/08/14 1715 02/08/14 2047 02/09/14 0759 02/09/14 1152  GLUCAP 164* 104* 104* 130* 164*    Radiological Exams on Admission: Dg Chest 2 View  02/12/2014   CLINICAL DATA:  Dyspnea  EXAM: CHEST  2 VIEW  COMPARISON:  02/07/2014  FINDINGS: Mild cardiac enlargement. Extensive aortic calcification. Pleural apical calcification bilaterally, mild. Mild central vascular congestion. There is mild interstitial prominence in the mid to lower lung zones bilaterally. Hyperinflation suggests COPD.  IMPRESSION: COPD. Findings suggest the possibility of mild interstitial pulmonary edema related to congestive heart failure.   Electronically Signed   By:  Skipper Cliche M.D.   On: 02/12/2014 07:56    EKG: Independently reviewed.Afib, no acute ST t wave changes  Assessment/Plan Principal Problem:   Acute on chronic systolic CHF (congestive heart failure)  -EF of 25% based on echo this month  -not on diuretics chronically -Iv lasix 40mg  q12 -monitor I/Os,weights -continue lisinopril/coreg    Hypothyroidism -continue synthroid    CAD (coronary artery disease) of artery bypass graft -h/o PCI of SVG to OM in 2007 -continue ASA/Coreg/statin    DM (diabetes mellitus) -hold metformin, SSI    A-fib -rate slightly up, continue coreg, Apixaban    Remote h/o Throat cancer -s/p tach, trach care  Code Status: Full Code DVT Prophylaxis: on apixaban Family Communication: none at bedside Disposition Plan: inpt  Time spent: 45min  Makalah Asberry [facility] Pager [phone]  **Disclaimer: This note may have been dictated with voice recognition software. Similar sounding words can inadvertently be transcribed and this note may contain transcription errors which may not have been corrected upon publication of note.**

## 2014-02-12 NOTE — ED Provider Notes (Signed)
CSN: [id number]     Arrival date & time 02/12/14  0711 History   First MD Initiated Contact with Patient 02/12/14 [phone]     Chief Complaint  Patient presents with  . Shortness of Breath     HPI  Patient presents with dyspnea, no focal pain. History of present illness is per the patient and his son. The patient was admitted earlier this week yesterday similar presentation, at that point he was diagnosed with new atrial fibrillation. Patient notes that on discharge he was on a new medication, anticoagulants, as well as carvedilol. Patient's current of dyspnea began at some point in the last day, has been worsening, with increasing anxiety.  There is no chest pain, headache, confusion, disorientation, swelling, weight gain, weight loss. According to patient's son, the patient is the primary caregiver for his wife, who has progressing Alzheimer's dementia.   Past Medical History  Diagnosis Date  . Coronary artery disease     s/p CABG 1996 then PCI of SVG to OM 2007  . Diverticulosis   . Dyslipidemia   . Renal artery stenosis     s/p bilateral stents  . Hiatal hernia   . Colon polyps   . Carotid artery stenosis     50% BILATERAL 2014  . Peripheral neuropathy   . Anemia   . Hypothyroidism   . Nasal polyps   . Hypertension   . Diabetes mellitus   . Throat cancer S/P TRACHECTOMY   . Myocardial infarction   . Peripheral vascular disease     LOWER EXT  . Acute myocardial infarction, unspecified site, initial episode of care   . Atrial fibrillation/flutter   . GERD (gastroesophageal reflux disease)   . Shortness of breath     Hx: of  . HOH (hard of hearing) VERY    Past Surgical History  Procedure Laterality Date  . Coronary artery bypass graft      1996  . Cardiac catheterization      PCI of SVG to OM 2007  . Renal artery stent    . Tracheostomy    . Appendectomy    . Tonsillectomy     Family History  Problem Relation Age of Onset  . Hypertension Mother   . Heart  attack Brother   . Diabetes Son    History  Substance Use Topics  . Smoking status: Former Smoker -- 0.30 packs/day    Types: Cigarettes    Quit date: 06/18/1946  . Smokeless tobacco: Not on file  . Alcohol Use: No    Review of Systems  Constitutional:       Per HPI, otherwise negative  HENT:       Per HPI, otherwise negative  Respiratory:       Per HPI, otherwise negative  Cardiovascular:       Per HPI, otherwise negative  Gastrointestinal: Negative for vomiting.  Endocrine:       Negative aside from HPI  Genitourinary:       Neg aside from HPI   Musculoskeletal:       Per HPI, otherwise negative  Skin: Negative.   Neurological: Negative for syncope.      Allergies  Review of patient's allergies indicates no known allergies.  Home Medications   Prior to Admission medications   Medication Sig Start Date End Date Taking? Authorizing Provider  acarbose (PRECOSE) 100 MG tablet Take 100 mg by mouth 3 (three) times daily with meals.    Yes Historical Provider, MD  albuterol (PROVENTIL HFA;VENTOLIN HFA) 108 (90 BASE) MCG/ACT inhaler Inhale 2-3 puffs into the lungs at bedtime.    Yes Historical Provider, MD  apixaban (ELIQUIS) 5 MG TABS tablet Take 1 tablet (5 mg total) by mouth 2 (two) times daily. 02/09/14  Yes Donne Hazel, MD  aspirin EC 81 MG tablet Take 81 mg by mouth 2 (two) times a week.   Yes Historical Provider, MD  atorvastatin (LIPITOR) 10 MG tablet Take 10 mg by mouth daily.   Yes Historical Provider, MD  carvedilol (COREG) 6.25 MG tablet Take 1 tablet (6.25 mg total) by mouth 2 (two) times daily with a meal. 02/09/14  Yes Donne Hazel, MD  citalopram (CELEXA) 10 MG tablet Take 10 mg by mouth daily.   Yes Historical Provider, MD  ferrous sulfate 325 (65 FE) MG tablet Take 325 mg by mouth daily with breakfast.   Yes Historical Provider, MD  finasteride (PROSCAR) 5 MG tablet Take 5 mg by mouth daily.    Yes Historical Provider, MD  fluticasone (FLONASE) 50  MCG/ACT nasal spray Place 2 sprays into both nostrils daily as needed for rhinitis or allergies.    Yes Historical Provider, MD  glipiZIDE (GLUCOTROL) 10 MG tablet Take 10 mg by mouth 2 (two) times daily before a meal.    Yes Historical Provider, MD  levothyroxine (SYNTHROID) 75 MCG tablet Take 1 tablet (75 mcg total) by mouth daily before breakfast. 02/09/14  Yes Donne Hazel, MD  lisinopril-hydrochlorothiazide (PRINZIDE,ZESTORETIC) 20-12.5 MG per tablet Take 1 tablet by mouth 2 (two) times daily. 10/13/13  Yes [name], MD  metFORMIN (GLUCOPHAGE) 500 MG tablet Take 2 tablets (1,000 mg total) by mouth 2 (two) times daily with a meal. 11/09/12  Yes [name], MD  terbinafine (LAMISIL) 1 % cream Apply topically 2 (two) times daily. 02/09/14  Yes Donne Hazel, MD   BP 119/55  Temp(Src) 98.8 F (37.1 C) (Oral)  Resp 17  SpO2 97% Physical Exam  Nursing note and vitals reviewed. Constitutional: He is oriented to person, place, and time. He appears well-developed. No distress.  HENT:  Head: Normocephalic and atraumatic.  Eyes: Conjunctivae and EOM are normal.  Neck:  Tracheostomy, unremarkable  Cardiovascular: Normal rate and regular rhythm.   Pulmonary/Chest: Effort normal. No stridor. No respiratory distress. He has decreased breath sounds.  Abdominal: He exhibits no distension.  Musculoskeletal: He exhibits no edema.  Neurological: He is alert and oriented to person, place, and time.  Skin: Skin is warm and dry.  Psychiatric: He has a normal mood and affect.    ED Course  Procedures (including critical care time) Labs Review Labs Reviewed  CBC - Abnormal; Notable for the following:    RBC 3.54 (*)    Hemoglobin 11.4 (*)    HCT 33.7 (*)    All other components within normal limits  TROPONIN I  MAGNESIUM  PRO B NATRIURETIC PEPTIDE  BASIC METABOLIC PANEL    Imaging Review Dg Chest 2 View  02/12/2014   CLINICAL DATA:  Dyspnea  EXAM: CHEST  2 VIEW   COMPARISON:  02/07/2014  FINDINGS: Mild cardiac enlargement. Extensive aortic calcification. Pleural apical calcification bilaterally, mild. Mild central vascular congestion. There is mild interstitial prominence in the mid to lower lung zones bilaterally. Hyperinflation suggests COPD.  IMPRESSION: COPD. Findings suggest the possibility of mild interstitial pulmonary edema related to congestive heart failure.   Electronically Signed   By: Skipper Cliche M.D.   On:  02/12/2014 07:56       EKG Interpretation   Date/Time:  Friday February 12 2014 07:15:30 EDT Ventricular Rate:  115 PR Interval:    QRS Duration: 154 QT Interval:  382 QTC Calculation: 528 R Axis:   -60 Text Interpretation:  Atrial fibrillation with rapid ventricular response  Left axis deviation Left bundle branch block Abnormal ECG Atrial  fibrillation with rapid ventricular response Left axis deviation Left  bundle branch block Abnormal ekg Confirmed by Carmin Muskrat  MD [phone])  on 02/12/2014 8:21:17 AM      Update: patient in similar condition, minimal symptoms at rest.   Chart review demonstrates that the patient had recent cardiac evaluation, and is currently being evaluated for elective DC cardioversion as an outpatient. Patient also had recent echocardiogram, ejection fraction 25-30%.  Initial labs demonstrate elevated BNP. The patient is normotensive, and will receive beta blocker boluses to decrease his atrial fibrillation with rapid ventricular response.  Patient was also hypomagnesemic (1.2) and will receive repletion.  Patient has been seen by cardiology, who recommends addition of Lasix.  Patient will be admitted to the hospitalist team.  11:30 AM On re-exam he remains tachycardic,  Though decreased from baseline.   MDM   Patient presents after recent admission for new atrial fibrillation, with ongoing dyspnea. The patient's BNP is substantially elevated, otherwise labs are consistent with prior  results. Patient received magnesium repletion due to his critically low magnesium level, required admission to due his elevated BNP, hypokinetic echocardiogram findings from earlier in the week, critically abnormal electrolyte levels and ongoing dyspnea.   CRITICAL CARE Performed by: Carmin Muskrat Total critical care time: 35 Critical care time was exclusive of separately billable procedures and treating other patients. Critical care was necessary to treat or prevent imminent or life-threatening deterioration. Critical care was time spent personally by me on the following activities: development of treatment plan with patient and/or surrogate as well as nursing, discussions with consultants, evaluation of patient's response to treatment, examination of patient, obtaining history from patient or surrogate, ordering and performing treatments and interventions, ordering and review of laboratory studies, ordering and review of radiographic studies, pulse oximetry and re-evaluation of patient's condition.    Carmin Muskrat, MD 02/12/14 1209

## 2014-02-12 NOTE — Consult Note (Signed)
CARDIOLOGY CONSULT NOTE   Patient ID: [name] MRN: [id number], DOB/AGE: February 15, 1923   Admit date: 02/12/2014 Date of Consult: 02/12/2014   Primary Physician: Mathews Argyle, MD Primary Cardiologist: Dr. Caryl Comes  Pt. Profile  80 year old gentleman returns to the hospital with increasing shortness of breath.  He was recently discharged in atrial fibrillation on anticoagulation.  Problem List  Past Medical History  Diagnosis Date  . Coronary artery disease     s/p CABG 1996 then PCI of SVG to OM 2007  . Diverticulosis   . Dyslipidemia   . Renal artery stenosis     s/p bilateral stents  . Hiatal hernia   . Colon polyps   . Carotid artery stenosis     50% BILATERAL 2014  . Peripheral neuropathy   . Anemia   . Hypothyroidism   . Nasal polyps   . Hypertension   . Diabetes mellitus   . Throat cancer S/P TRACHECTOMY   . Myocardial infarction   . Peripheral vascular disease     LOWER EXT  . Acute myocardial infarction, unspecified site, initial episode of care   . Atrial fibrillation/flutter   . GERD (gastroesophageal reflux disease)   . Shortness of breath     Hx: of  . HOH (hard of hearing) VERY     Past Surgical History  Procedure Laterality Date  . Coronary artery bypass graft      1996  . Cardiac catheterization      PCI of SVG to OM 2007  . Renal artery stent    . Tracheostomy    . Appendectomy    . Tonsillectomy       Allergies  No Known Allergies  HPI   This 80 year old gentleman has atrial flutter fibrillation of uncertain duration.  He was hospitalized here from 02/07/2014 until 02/09/2014.  He initially presented at that time with new onset atrial fibrillation of uncertain duration.  He also had dizziness and low blood pressure.  He was begun on low dose carvedilol.  He was seen by Dr. Caryl Comes who suggested outpatient direct current cardioversion in 3-4 weeks after anticoagulation.  He is chadsvasc score of 5.  He was found to be hypothyroid  and his Synthroid dose was increased during the last hospital stay.  He has a prior history of hypertension dyslipidemia and diabetes mellitus.  He has had a previous tracheostomy. Yesterday he began to notice worsening dyspnea which increased during the night.  He returned to the emergency room.  His pro BNP is 9440.  Chest x-ray suggests mild CHF.  His troponin is normal and he denies chest pain.  His echocardiogram on the previous admission revealed: - Left ventricle: The cavity size was normal. Systolic function was severely reduced. The estimated ejection fraction was in the range of 25% to 30%. Diffuse hypokinesis. - Ventricular septum: Septal motion showed paradox. - Aortic valve: insufficient doppler performed to assess for AS. Recommend repeat limited study with doppler across the AV to determined mean and peak gradient and velocity and calculate AVA. Severe diffuse thickening and calcification. Valve mobility was restricted. - Mitral valve: Severely calcified annulus. Moderate thickening and calcification. There was trivial regurgitation. - Left atrium: The atrium was moderately to severely dilated. - Tricuspid valve: There was mild-moderate regurgitation. - Pulmonary arteries: PA peak pressure: 43 mm Hg (S).   Inpatient Medications  . labetalol  10 mg Intravenous Once    Family History Family History  Problem Relation Age of Onset  . Hypertension  Mother   . Heart attack Brother   . Diabetes Son      Social History History   Social History  . Marital Status: Married    Spouse Name: [name]    Number of Children: [name]  . Years of Education: [name]   Occupational History  . Not on file.   Social History Main Topics  . Smoking status: Former Smoker -- 0.30 packs/day    Types: Cigarettes    Quit date: 06/18/1946  . Smokeless tobacco: Not on file  . Alcohol Use: No  . Drug Use: No  . Sexual Activity: Not on file   Other Topics Concern  . Not on file   Social  History Narrative   ** Merged History Encounter **         Review of Systems  General:  No chills, fever, night sweats or weight changes.  Cardiovascular:  No chest pain, dyspnea on exertion, edema, orthopnea, palpitations, paroxysmal nocturnal dyspnea. Dermatological: No rash, lesions/masses Respiratory: No cough, dyspnea Urologic: No hematuria, dysuria Abdominal:   No nausea, vomiting, diarrhea, bright red blood per rectum, melena, or hematemesis Neurologic:  No visual changes, wkns, changes in mental status. All other systems reviewed and are otherwise negative except as noted above.  Physical Exam  Blood pressure 129/66, pulse 93, temperature 98.8 F (37.1 C), temperature source Oral, resp. rate 22, SpO2 100.00%.  General: Pleasant, NAD.  Has a tracheostomy. Psych: Normal affect. Neuro: Alert and oriented X 3. Moves all extremities spontaneously. HEENT: Normal  Neck: Supple without bruits or JVD. Lungs:  Resp regular and unlabored, minimal inspiratory basilar rales Heart: Irregularly irregular pulse.  Grade 2/6 systolic ejection murmur at left sternal edge.  Soft S3 gallop. Abdomen: Soft, non-tender, non-distended, BS + x 4.  Extremities: No clubbing, there is trace ankle edema present. DP/PT/Radials 2+ and equal bilaterally.  Labs   Recent Labs  02/12/14 0728  TROPONINI <0.30   Lab Results  Component Value Date   WBC 8.5 02/12/2014   HGB 11.4* 02/12/2014   HCT 33.7* 02/12/2014   MCV 95.2 02/12/2014   PLT 227 02/12/2014     Recent Labs Lab 02/08/14 0005 02/12/14 0728  NA 141 139  K 4.0 4.1  CL 108 102  CO2 24 23  BUN 24* 15  CREATININE 0.94 0.96  CALCIUM 7.9* 8.2*  PROT 5.3*  --   BILITOT 0.2*  --   ALKPHOS 46  --   ALT 7  --   AST 12  --   GLUCOSE 156* 128*   Lab Results  Component Value Date   CHOL 123 11/07/2012   HDL 49 11/07/2012   LDLCALC 66 11/07/2012   TRIG 38 11/07/2012   Lab Results  Component Value Date   DDIMER  Value: 0.39        AT  THE INHOUSE ESTABLISHED CUTOFF VALUE OF 0.48 ug/mL FEU, THIS ASSAY HAS BEEN DOCUMENTED IN THE LITERATURE TO HAVE A SENSITIVITY AND NEGATIVE PREDICTIVE VALUE OF AT LEAST 98 TO 99%.  THE TEST RESULT SHOULD BE CORRELATED WITH AN ASSESSMENT OF THE CLINICAL PROBABILITY OF DVT / VTE. 01/07/2010    Radiology/Studies  Dg Chest 2 View  02/12/2014   CLINICAL DATA:  Dyspnea  EXAM: CHEST  2 VIEW  COMPARISON:  02/07/2014  FINDINGS: Mild cardiac enlargement. Extensive aortic calcification. Pleural apical calcification bilaterally, mild. Mild central vascular congestion. There is mild interstitial prominence in the mid to lower lung zones bilaterally. Hyperinflation suggests COPD.  IMPRESSION:  COPD. Findings suggest the possibility of mild interstitial pulmonary edema related to congestive heart failure.   Electronically Signed   By: Skipper Cliche M.D.   On: 02/12/2014 07:56   Dg Chest Port 1 View  02/07/2014   CLINICAL DATA:  Dizziness.  EXAM: PORTABLE CHEST - 1 VIEW  COMPARISON:  May 17, 2013.  FINDINGS: The heart size and mediastinal contours are within normal limits. Status post coronary artery bypass graft. No pneumothorax or pleural effusion is noted. Both lungs are clear. The visualized skeletal structures are unremarkable.  IMPRESSION: No acute cardiopulmonary abnormality seen.   Electronically Signed   By: Sabino Dick M.D.   On: 02/07/2014 10:43    ECG Atrial flutter with 2-1 ventricular response.  Left bundle branch block, old   ASSESSMENT AND PLAN  1.  Acute on chronic systolic heart failure with increased dyspnea 2. atrial flutter with 2:1 ventricular block 3. left bundle branch block 4. valvular heart disease with aortic stenosis uncertain severity 5. diabetes mellitus 6. Hypothyroidism 7. ischemic heart disease with CABG in 1996 and PCI to saphenous vein graft to OM in 2007  Recommendation: Admission on medical service. Consider adding low dose diuretic such as lasix. Will reassess  aortic valve with echo.  Signed, Darlin Coco, MD  02/12/2014, 9:53 AM

## 2014-02-12 NOTE — Progress Notes (Signed)
  Echocardiogram 2D Echocardiogram has been performed.  [name] 02/12/2014, 5:30 PM

## 2014-02-12 NOTE — ED Notes (Signed)
Family went to restroom, denies needs.

## 2014-02-12 NOTE — ED Notes (Signed)
Trach pt, reports sob. Recently admitted for same. spo2 100% at triage, ekg done.

## 2014-02-12 NOTE — ED Notes (Signed)
Attempted report 

## 2014-02-13 DIAGNOSIS — I509 Heart failure, unspecified: Secondary | ICD-10-CM

## 2014-02-13 DIAGNOSIS — I4891 Unspecified atrial fibrillation: Secondary | ICD-10-CM

## 2014-02-13 LAB — CBC
HEMATOCRIT: 32.7 % — AB (ref 39.0–52.0)
Hemoglobin: 11.1 g/dL — ABNORMAL LOW (ref 13.0–17.0)
MCH: 31.3 pg (ref 26.0–34.0)
MCHC: 33.9 g/dL (ref 30.0–36.0)
MCV: 92.1 fL (ref 78.0–100.0)
Platelets: 257 10*3/uL (ref 150–400)
RBC: 3.55 MIL/uL — AB (ref 4.22–5.81)
RDW: 14.1 % (ref 11.5–15.5)
WBC: 6.4 10*3/uL (ref 4.0–10.5)

## 2014-02-13 LAB — GLUCOSE, CAPILLARY
GLUCOSE-CAPILLARY: 195 mg/dL — AB (ref 70–99)
GLUCOSE-CAPILLARY: 202 mg/dL — AB (ref 70–99)
Glucose-Capillary: 202 mg/dL — ABNORMAL HIGH (ref 70–99)
Glucose-Capillary: 153 mg/dL — ABNORMAL HIGH (ref 70–99)

## 2014-02-13 LAB — CULTURE, BLOOD (ROUTINE X 2)
CULTURE: NO GROWTH
Culture: NO GROWTH

## 2014-02-13 LAB — BASIC METABOLIC PANEL
Anion gap: 14 (ref 5–15)
BUN: 19 mg/dL (ref 6–23)
CO2: 24 meq/L (ref 19–32)
CREATININE: 0.95 mg/dL (ref 0.50–1.35)
Calcium: 8.4 mg/dL (ref 8.4–10.5)
Chloride: 100 mEq/L (ref 96–112)
GFR calc Af Amer: 84 mL/min — ABNORMAL LOW (ref >90)
GFR, EST NON AFRICAN AMERICAN: 72 mL/min — AB (ref >90)
GLUCOSE: 177 mg/dL — AB (ref 70–99)
Potassium: 3.5 mEq/L — ABNORMAL LOW (ref 3.7–5.3)
Sodium: 138 mEq/L (ref 137–147)

## 2014-02-13 MED ORDER — LISINOPRIL 10 MG PO TABS
10.0000 mg | ORAL_TABLET | Freq: Every day | ORAL | Status: DC
  Administered 2014-02-14: 10 mg via ORAL
  Filled 2014-02-13: qty 1

## 2014-02-13 MED ORDER — CARVEDILOL 12.5 MG PO TABS
12.5000 mg | ORAL_TABLET | Freq: Two times a day (BID) | ORAL | Status: DC
  Administered 2014-02-13 – 2014-02-14 (×2): 12.5 mg via ORAL
  Filled 2014-02-13 (×4): qty 1

## 2014-02-13 MED ORDER — POTASSIUM CHLORIDE CRYS ER 20 MEQ PO TBCR: 40.0000 meq | EXTENDED_RELEASE_TABLET | Freq: Two times a day (BID) | ORAL | Status: DC

## 2014-02-13 MED ORDER — POTASSIUM CHLORIDE CRYS ER 20 MEQ PO TBCR
40.0000 meq | EXTENDED_RELEASE_TABLET | Freq: Every day | ORAL | Status: DC
  Administered 2014-02-13 – 2014-02-14 (×2): 40 meq via ORAL
  Filled 2014-02-13 (×2): qty 2

## 2014-02-13 NOTE — Progress Notes (Signed)
Patient ID: [name], male   DOB: Aug 11, 1922, 80 y.o.   MRN: [id number]     Subjective:    SOB improving  Objective:   Temp:  [98 F (36.7 C)-98.8 F (37.1 C)] 98.5 F (36.9 C) (08/29 1022) Pulse Rate:  [75-123] 75 (08/29 1022) Resp:  [18-25] 18 (08/29 0512) BP: (102-127)/(62-76) 106/62 mmHg (08/29 1022) SpO2:  [96 %-99 %] 96 % (08/29 1022) Weight:  [141 lb 4.8 oz (64.093 kg)-145 lb 8.1 oz (66 kg)] 141 lb 4.8 oz (64.093 kg) (08/29 0512) Last BM Date: 02/12/14  Filed Weights   02/12/14 1302 02/13/14 0512  Weight: 145 lb 8.1 oz (66 kg) 141 lb 4.8 oz (64.093 kg)    Intake/Output Summary (Last 24 hours) at 02/13/14 1203 Last data filed at 02/13/14 0902  Gross per 24 hour  Intake    600 ml  Output   1450 ml  Net   -850 ml    Telemetry: afib, elevated rates 110s  Exam:  General: NAD  Resp: clear anteriorally  Cardiac: irreg, rate 110  GI: abdomen soft, NT, ND  MSK: no LE edema  Neuro: no focal deficits  Psych: appropriate affect  Lab Results:  Basic Metabolic Panel:  Recent Labs Lab 02/07/14 0925 02/07/14 1305 02/08/14 0005 02/12/14 0728 02/13/14 0315  NA 138  --  141 139 138  K 4.3  --  4.0 4.1 3.5*  CL 102  --  108 102 100  CO2 23  --  24 23 24   GLUCOSE 146*  --  156* 128* 177*  BUN 24*  --  24* 15 19  CREATININE 1.11 0.97 0.94 0.96 0.95  CALCIUM 8.7  --  7.9* 8.2* 8.4  MG  --  1.3*  --  1.2*  --     Liver Function Tests:  Recent Labs Lab 02/07/14 0925 02/07/14 1305 02/08/14 0005  AST 14 13 12   ALT 8 8 7   ALKPHOS 51 49 46  BILITOT 0.5 0.5 0.2*  PROT 6.0 5.5* 5.3*  ALBUMIN 3.3* 3.1* 2.9*    CBC:  Recent Labs Lab 02/09/14 0539 02/12/14 0728 02/13/14 0315  WBC 9.5 8.5 6.4  HGB 12.0* 11.4* 11.1*  HCT 35.3* 33.7* 32.7*  MCV 94.1 95.2 92.1  PLT 231 227 257    Cardiac Enzymes:  Recent Labs Lab 02/07/14 1920 02/08/14 0005 02/12/14 0728  TROPONINI 0.30* <0.30 <0.30    BNP:  Recent Labs  05/17/13 1825  02/12/14 0728  PROBNP 983.2* 9440.0*    Coagulation: No results found for this basename: INR,  in the last 168 hours  ECG:   Medications:   Scheduled Medications: . acarbose  100 mg Oral TID WC  . apixaban  5 mg Oral BID  . [START ON 02/15/2014] aspirin EC  81 mg Oral Once per day on Mon Thu  . atorvastatin  10 mg Oral q1800  . carvedilol  6.25 mg Oral BID WC  . citalopram  10 mg Oral Daily  . finasteride  5 mg Oral Daily  . furosemide  40 mg Intravenous Q12H  . lisinopril  20 mg Oral Daily   And  . hydrochlorothiazide  12.5 mg Oral Daily  . insulin aspart  0-9 Units Subcutaneous TID WC  . levothyroxine  75 mcg Oral QAC breakfast  . potassium chloride  40 mEq Oral Daily     Infusions:     PRN Medications:  acetaminophen, acetaminophen, albuterol, fluticasone, ondansetron (ZOFRAN) IV, ondansetron  02/12/14 Echo Study  Conclusions  - Left ventricle: The cavity size was normal. Systolic function was severely reduced. The estimated ejection fraction was in the range of 25% to 30%. Diffuse hypokinesis. - Ventricular septum: Septal motion showed paradox. - Aortic valve: insufficient doppler performed to assess for AS. Recommend repeat limited study with doppler across the AV to determined mean and peak gradient and velocity and calculate AVA. Severe diffuse thickening and calcification. Valve mobility was restricted. - Mitral valve: Severely calcified annulus. Moderate thickening and calcification. There was trivial regurgitation. - Left atrium: The atrium was moderately to severely dilated. - Tricuspid valve: There was mild-moderate regurgitation. - Pulmonary arteries: PA peak pressure: 43 mm Hg (S).  Impressions:  - The right ventricular systolic pressure was increased consistent with moderate pulmonary hypertension.      Assessment/Plan    80 yo male hx of afib/flutter recently diagnosed earlier this month, HTN, hypothyroidism, HL, admitted with SOB.  1.  Acute on chronic systolic heart failure - echo 01/17/13/15 LVEF 25-30%, diffuse hypokinesis, moderate AS. Noted decline from echo 11/2012 with LVEF 45-50%. Etiologies of possible drop include progression of CAD, tachycardia induced CM given his new diagnosis of afib/aflutter. His thyroid function is also decreased (TSH 8.240) - CXR with mild CHF, BNP 9440.  - negative 1 liter yesterady, net negative 850 mL since admission.Renal function remains stable, he is on lasix 40mg  IV bid (since admit has received only lasix 20mg  x1 and 40mg  x2).  - he is on coreg, lisinpril. Stop HCTZ while on lasix.  - soft blood pressures at times, decrease lisinopril to allow more room to increase coreg in setting of afib with elevated rates - would focus on rate control and medical therapy in short term, if LVEF not improved would need consideration for LHC due to his LVEF drop   2. CAD - CABG in 1996 and PCI to saphenous vein graft to OM in 2007 - mild trop leak earlier in month in setting of afib, negative this admit. EKG shows SR with LBBB that's chronic .   3. Afib/flutter - from prior notes plan for DCCV in 3-4 weeks on anticoag. - he is on eliquis, coreg for rate control. Elevated rates at times, will increase coreg to 12.5 mg bid.   4. Hypokalemia - please keep K at 4 and Mg at 2 in setting of arrhythmia    Carlyle Dolly, M.D., F.A.C.C.

## 2014-02-13 NOTE — Progress Notes (Signed)
[facility] PROGRESS NOTE  [name] XBM:[id number] DOB: [birth year] DOA: 02/12/2014 PCP: Mathews Argyle, MD  Assessment/Plan: Acute on chronic systolic CHF (congestive heart failure)  -EF of 25% based on echo this month  -not on diuretics chronically  -continue IV lasix 40mg  q12  -negative 1L, daily weights  -continue lisinopril/coreg  -adamant to be discharged by tomorrow atleast  Hypothyroidism  -continue synthroid   CAD (coronary artery disease) of artery bypass graft  -h/o PCI of SVG to OM in 2007  -continue ASA/Coreg/statin   DM (diabetes mellitus)  -hold metformin, SSI   A-fib  -in NSR now, continue coreg, Apixaban   Remote h/o Throat cancer  -s/p tach, trach care  DVT proph: on apixaban  Code Status: Full Code Family Communication: none at bedside Disposition Plan: home tomorrow, adamant to leave by then   Consultants:  [facility]  HPI/Subjective: Breathing improving  Objective: Filed Vitals:   02/13/14 0512  BP: 109/62  Pulse: 123  Temp: 98 F (36.7 C)  Resp: 18    Intake/Output Summary (Last 24 hours) at 02/13/14 0937 Last data filed at 02/13/14 0902  Gross per 24 hour  Intake    600 ml  Output   1450 ml  Net   -850 ml   Filed Weights   02/12/14 1302 02/13/14 0512  Weight: 66 kg (145 lb 8.1 oz) 64.093 kg (141 lb 4.8 oz)    Exam:   General:  AAOx3  Cardiovascular: U2V2/ZDG, systolic murmur  Respiratory: crackles at lung bases  Abdomen: soft, Nt, Bs present  Musculoskeletal: no edema c/c   Data Reviewed: Basic Metabolic Panel:  Recent Labs Lab 02/07/14 0925 02/07/14 1305 02/08/14 0005 02/12/14 0728 02/13/14 0315  NA 138  --  141 139 138  K 4.3  --  4.0 4.1 3.5*  CL 102  --  108 102 100  CO2 23  --  24 23 24   GLUCOSE 146*  --  156* 128* 177*  BUN 24*  --  24* 15 19  CREATININE 1.11 0.97 0.94 0.96 0.95  CALCIUM 8.7  --  7.9* 8.2* 8.4  MG  --  1.3*  --  1.2*  --    Liver Function Tests:  Recent  Labs Lab 02/07/14 0925 02/07/14 1305 02/08/14 0005  AST 14 13 12   ALT 8 8 7   ALKPHOS 51 49 46  BILITOT 0.5 0.5 0.2*  PROT 6.0 5.5* 5.3*  ALBUMIN 3.3* 3.1* 2.9*   No results found for this basename: LIPASE, AMYLASE,  in the last 168 hours No results found for this basename: AMMONIA,  in the last 168 hours CBC:  Recent Labs Lab 02/07/14 1305 02/08/14 0005 02/09/14 0539 02/12/14 0728 02/13/14 0315  WBC 5.7 5.0 9.5 8.5 6.4  HGB 10.9* 10.1* 12.0* 11.4* 11.1*  HCT 32.9* 30.3* 35.3* 33.7* 32.7*  MCV 92.9 92.9 94.1 95.2 92.1  PLT 230 221 231 227 257   Cardiac Enzymes:  Recent Labs Lab 02/07/14 1121 02/07/14 1920 02/08/14 0005 02/12/14 0728  TROPONINI 0.34* 0.30* <0.30 <0.30   BNP (last 3 results)  Recent Labs  05/17/13 1825 02/12/14 0728  PROBNP 983.2* 9440.0*   CBG:  Recent Labs Lab 02/09/14 1152 02/12/14 1324 02/12/14 1557 02/12/14 2123 02/13/14 0637  GLUCAP 164* 176* 167* 225* 202*    Recent Results (from the past 240 hour(s))  CULTURE, BLOOD (ROUTINE X 2)     Status: None   Collection Time    02/07/14  9:25 AM  Result Value Ref Range Status   Specimen Description BLOOD RIGHT WRIST   Final   Special Requests BOTTLES DRAWN AEROBIC AND ANAEROBIC 1CC EACH   Final   Culture  Setup Time     Final   Value: 02/07/2014 17:45     Performed at [facility]   Culture     Final   Value:        BLOOD CULTURE RECEIVED NO GROWTH TO DATE CULTURE WILL BE HELD FOR 5 DAYS BEFORE ISSUING A FINAL NEGATIVE REPORT     Performed at [facility]   Report Status PENDING   Incomplete  URINE CULTURE     Status: None   Collection Time    02/07/14  9:28 AM      Result Value Ref Range Status   Specimen Description URINE, CATHETERIZED   Final   Special Requests ADD [id number]   Final   Culture  Setup Time     Final   Value: 02/07/2014 17:49     Performed at [facility]     Final   Value: NO GROWTH     Performed at [facility]   Culture     Final   Value: NO GROWTH     Performed at [facility]   Report Status 02/08/2014 FINAL   Final  CULTURE, BLOOD (ROUTINE X 2)     Status: None   Collection Time    02/07/14  9:35 AM      Result Value Ref Range Status   Specimen Description BLOOD RIGHT ARM   Final   Special Requests BOTTLES DRAWN AEROBIC AND ANAEROBIC [facility] EACH   Final   Culture  Setup Time     Final   Value: 02/07/2014 17:44     Performed at [facility]   Culture     Final   Value:        BLOOD CULTURE RECEIVED NO GROWTH TO DATE CULTURE WILL BE HELD FOR 5 DAYS BEFORE ISSUING A FINAL NEGATIVE REPORT     Performed at [facility]   Report Status PENDING   Incomplete     Studies: Dg Chest 2 View  02/12/2014   CLINICAL DATA:  Dyspnea  EXAM: CHEST  2 VIEW  COMPARISON:  02/07/2014  FINDINGS: Mild cardiac enlargement. Extensive aortic calcification. Pleural apical calcification bilaterally, mild. Mild central vascular congestion. There is mild interstitial prominence in the mid to lower lung zones bilaterally. Hyperinflation suggests COPD.  IMPRESSION: COPD. Findings suggest the possibility of mild interstitial pulmonary edema related to congestive heart failure.   Electronically Signed   By: Skipper Cliche M.D.   On: 02/12/2014 07:56    Scheduled Meds: . acarbose  100 mg Oral TID WC  . apixaban  5 mg Oral BID  . [START ON 02/15/2014] aspirin EC  81 mg Oral Once per day on Mon Thu  . atorvastatin  10 mg Oral q1800  . carvedilol  6.25 mg Oral BID WC  . citalopram  10 mg Oral Daily  . finasteride  5 mg Oral Daily  . furosemide  40 mg Intravenous Q12H  . lisinopril  20 mg Oral Daily   And  . hydrochlorothiazide  12.5 mg Oral Daily  . insulin aspart  0-9 Units Subcutaneous TID WC  . levothyroxine  75 mcg Oral QAC breakfast  . potassium chloride  40 mEq Oral BID   Continuous Infusions:  Antibiotics  Given (last 72 hours)   None      Principal Problem:   Acute on  chronic systolic CHF (congestive heart failure) Active Problems:   Throat cancer   Hypothyroidism   Dyspnea   CAD (coronary artery disease) of artery bypass graft   DM (diabetes mellitus)   A-fib    Time spent: 70min    [name]  [facility] Pager [phone]. If 7PM-7AM, please contact night-coverage at [email or web address], password [facility] 02/13/2014, 9:37 AM  LOS: 1 day

## 2014-02-13 NOTE — Progress Notes (Signed)
Pt awake during the night, remains alert and oriented, able to communicate needs

## 2014-02-14 DIAGNOSIS — I25810 Atherosclerosis of coronary artery bypass graft(s) without angina pectoris: Secondary | ICD-10-CM

## 2014-02-14 LAB — BASIC METABOLIC PANEL
Anion gap: 14 (ref 5–15)
BUN: 29 mg/dL — AB (ref 6–23)
CO2: 26 mEq/L (ref 19–32)
Calcium: 8.4 mg/dL (ref 8.4–10.5)
Chloride: 97 mEq/L (ref 96–112)
Creatinine, Ser: 1.03 mg/dL (ref 0.50–1.35)
GFR calc non Af Amer: 63 mL/min — ABNORMAL LOW (ref >90)
GFR, EST AFRICAN AMERICAN: 73 mL/min — AB (ref >90)
Glucose, Bld: 283 mg/dL — ABNORMAL HIGH (ref 70–99)
POTASSIUM: 3.3 meq/L — AB (ref 3.7–5.3)
SODIUM: 137 meq/L (ref 137–147)

## 2014-02-14 LAB — GLUCOSE, CAPILLARY
GLUCOSE-CAPILLARY: 238 mg/dL — AB (ref 70–99)
Glucose-Capillary: 243 mg/dL — ABNORMAL HIGH (ref 70–99)

## 2014-02-14 MED ORDER — CARVEDILOL 6.25 MG PO TABS: 12.5000 mg | ORAL_TABLET | Freq: Two times a day (BID) | ORAL | Status: DC

## 2014-02-14 MED ORDER — POTASSIUM CHLORIDE CRYS ER 20 MEQ PO TBCR: 40.0000 meq | EXTENDED_RELEASE_TABLET | Freq: Every day | ORAL | Status: DC

## 2014-02-14 MED ORDER — LISINOPRIL 5 MG PO TABS: 5.0000 mg | ORAL_TABLET | Freq: Every day | ORAL | Status: DC

## 2014-02-14 MED ORDER — FUROSEMIDE 20 MG PO TABS: 20.0000 mg | ORAL_TABLET | Freq: Every day | ORAL | Status: DC

## 2014-02-14 MED ORDER — LISINOPRIL 5 MG PO TABS
5.0000 mg | ORAL_TABLET | Freq: Every day | ORAL | Status: DC
Start: 2014-02-15 — End: 2014-02-14

## 2014-02-14 NOTE — Discharge Summary (Signed)
Physician Discharge Summary  Phinehas Grounds AJO:[id number] DOB: 1922-09-13 DOA: 02/12/2014  PCP: Mathews Argyle, MD  Admit date: 02/12/2014 Discharge date: 02/14/2014  Time spent: 45 minutes  Recommendations for Outpatient Follow-up:  1. Dr.Varanasi in 7-10days  Discharge Diagnoses:  Principal Problem:   Acute on chronic systolic CHF (congestive heart failure) Active Problems:   Throat cancer   Hypothyroidism   Dyspnea   CAD (coronary artery disease) of artery bypass graft   DM (diabetes mellitus)   A-fib   Discharge Condition: stable  Diet recommendation:low sodium, diabetic  Filed Weights   02/12/14 1302 02/13/14 0512 02/14/14 0645  Weight: 66 kg (145 lb 8.1 oz) 64.093 kg (141 lb 4.8 oz) 60.782 kg (134 lb)    History of present illness:  [name] is a 80 y.o. male with PMH of CAD s/p CABG, ICM EF of 25%, DM, remote h/o throat CA/trach, h/o RAS s/p stents, Recent AFib started on Apixaban, coreg and supposed to FU with cards for Cardioversion, just discharged from [facility] 8/25.  Presented to the ER  with dyspnea x1 day with some orthopnea.  In ER CXR with CHF and BNP >9K.  Hospital Course:  Acute on chronic systolic CHF (congestive heart failure)  -EF of 25% based on echo this month, drop in EF from Echo in 2014 from 45% -not on diuretics chronically  -diuresed with IV lasix 40mg  q12, weight dropped 11lbs -continue lisinopril/coreg  -consider eval as outpt for drop in EF  Hypothyroidism  -continue synthroid   CAD (coronary artery disease) of artery bypass graft  -h/o PCI of SVG to OM in 2007  -continue ASA/Coreg/statin   DM (diabetes mellitus)  -resumed metformin   A-fib  -in NSR now, continue coreg, Apixaban   Remote h/o Throat cancer  -s/p tach, trach care   Consultations:  Cards  Discharge Exam: Filed Vitals:   02/14/14 0900  BP: 94/45  Pulse: 85  Temp: 98.1 F (36.7 C)  Resp: 20    General: AAOx3 Cardiovascular:S1S2/RRR Respiratory:  CTAB  Discharge Instructions You were cared for by a hospitalist during your hospital stay. If you have any questions about your discharge medications or the care you received while you were in the hospital after you are discharged, you can call the unit and asked to speak with the hospitalist on call if the hospitalist that took care of you is not available. Once you are discharged, your primary care physician will handle any further medical issues. Please note that NO REFILLS for any discharge medications will be authorized once you are discharged, as it is imperative that you return to your primary care physician (or establish a relationship with a primary care physician if you do not have one) for your aftercare needs so that they can reassess your need for medications and monitor your lab values.  Discharge Instructions   Diet - low sodium heart healthy    Complete by:  As directed      Diet Carb Modified    Complete by:  As directed      Increase activity slowly    Complete by:  As directed             Medication List    STOP taking these medications       lisinopril-hydrochlorothiazide 20-12.5 MG per tablet  Commonly known as:  PRINZIDE,ZESTORETIC      TAKE these medications       acarbose 100 MG tablet  Commonly known as:  PRECOSE  Take  100 mg by mouth 3 (three) times daily with meals.     albuterol 108 (90 BASE) MCG/ACT inhaler  Commonly known as:  PROVENTIL HFA;VENTOLIN HFA  Inhale 2-3 puffs into the lungs at bedtime.     apixaban 5 MG Tabs tablet  Commonly known as:  ELIQUIS  Take 1 tablet (5 mg total) by mouth 2 (two) times daily.     aspirin EC 81 MG tablet  Take 81 mg by mouth 2 (two) times a week.     atorvastatin 10 MG tablet  Commonly known as:  LIPITOR  Take 10 mg by mouth daily.     carvedilol 6.25 MG tablet  Commonly known as:  COREG  Take 2 tablets (12.5 mg total) by mouth 2 (two) times daily with a meal.     citalopram 10 MG tablet  Commonly  known as:  CELEXA  Take 10 mg by mouth daily.     ferrous sulfate 325 (65 FE) MG tablet  Take 325 mg by mouth daily with breakfast.     finasteride 5 MG tablet  Commonly known as:  PROSCAR  Take 5 mg by mouth daily.     fluticasone 50 MCG/ACT nasal spray  Commonly known as:  FLONASE  Place 2 sprays into both nostrils daily as needed for rhinitis or allergies.     furosemide 20 MG tablet  Commonly known as:  LASIX  Take 1 tablet (20 mg total) by mouth daily.  Start taking on:  02/15/2014     glipiZIDE 10 MG tablet  Commonly known as:  GLUCOTROL  Take 10 mg by mouth 2 (two) times daily before a meal.     levothyroxine 75 MCG tablet  Commonly known as:  SYNTHROID  Take 1 tablet (75 mcg total) by mouth daily before breakfast.     lisinopril 5 MG tablet  Commonly known as:  PRINIVIL,ZESTRIL  Take 1 tablet (5 mg total) by mouth daily.  Start taking on:  02/15/2014     metFORMIN 500 MG tablet  Commonly known as:  GLUCOPHAGE  Take 2 tablets (1,000 mg total) by mouth 2 (two) times daily with a meal.     potassium chloride SA 20 MEQ tablet  Commonly known as:  K-DUR,KLOR-CON  Take 2 tablets (40 mEq total) by mouth daily.     terbinafine 1 % cream  Commonly known as:  LAMISIL  Apply topically 2 (two) times daily.       No Known Allergies     Follow-up Information   Follow up with [name]., MD. Schedule an appointment as soon as possible for a visit in 10 days.   Specialty:  Interventional Cardiology   Contact information:   [address] [place] [address] [phone]        The results of significant diagnostics from this hospitalization (including imaging, microbiology, ancillary and laboratory) are listed below for reference.    Significant Diagnostic Studies: Dg Chest 2 View  02/12/2014   CLINICAL DATA:  Dyspnea  EXAM: CHEST  2 VIEW  COMPARISON:  02/07/2014  FINDINGS: Mild cardiac enlargement. Extensive aortic calcification.  Pleural apical calcification bilaterally, mild. Mild central vascular congestion. There is mild interstitial prominence in the mid to lower lung zones bilaterally. Hyperinflation suggests COPD.  IMPRESSION: COPD. Findings suggest the possibility of mild interstitial pulmonary edema related to congestive heart failure.   Electronically Signed   By: Skipper Cliche M.D.   On: 02/12/2014 07:56   Dg  Chest Port 1 View  02/07/2014   CLINICAL DATA:  Dizziness.  EXAM: PORTABLE CHEST - 1 VIEW  COMPARISON:  May 17, 2013.  FINDINGS: The heart size and mediastinal contours are within normal limits. Status post coronary artery bypass graft. No pneumothorax or pleural effusion is noted. Both lungs are clear. The visualized skeletal structures are unremarkable.  IMPRESSION: No acute cardiopulmonary abnormality seen.   Electronically Signed   By: Sabino Dick M.D.   On: 02/07/2014 10:43    Microbiology: Recent Results (from the past 240 hour(s))  CULTURE, BLOOD (ROUTINE X 2)     Status: None   Collection Time    02/07/14  9:25 AM      Result Value Ref Range Status   Specimen Description BLOOD RIGHT WRIST   Final   Special Requests BOTTLES DRAWN AEROBIC AND ANAEROBIC 1CC EACH   Final   Culture  Setup Time     Final   Value: 02/07/2014 17:45     Performed at [facility]   Culture     Final   Value: NO GROWTH 5 DAYS     Performed at [facility]   Report Status 02/13/2014 FINAL   Final  URINE CULTURE     Status: None   Collection Time    02/07/14  9:28 AM      Result Value Ref Range Status   Specimen Description URINE, CATHETERIZED   Final   Special Requests ADD [id number]   Final   Culture  Setup Time     Final   Value: 02/07/2014 17:49     Performed at [place]     Final   Value: NO GROWTH     Performed at [facility]   Culture     Final   Value: NO GROWTH     Performed at [facility]   Report Status 02/08/2014 FINAL   Final   CULTURE, BLOOD (ROUTINE X 2)     Status: None   Collection Time    02/07/14  9:35 AM      Result Value Ref Range Status   Specimen Description BLOOD RIGHT ARM   Final   Special Requests BOTTLES DRAWN AEROBIC AND ANAEROBIC [facility] EACH   Final   Culture  Setup Time     Final   Value: 02/07/2014 17:44     Performed at [facility]   Culture     Final   Value: NO GROWTH 5 DAYS     Performed at [facility]   Report Status 02/13/2014 FINAL   Final     Labs: Basic Metabolic Panel:  Recent Labs Lab 02/07/14 1305 02/08/14 0005 02/12/14 0728 02/13/14 0315 02/14/14 0329  NA  --  141 139 138 137  K  --  4.0 4.1 3.5* 3.3*  CL  --  108 102 100 97  CO2  --  24 23 24 26   GLUCOSE  --  156* 128* 177* 283*  BUN  --  24* 15 19 29*  CREATININE 0.97 0.94 0.96 0.95 1.03  CALCIUM  --  7.9* 8.2* 8.4 8.4  MG 1.3*  --  1.2*  --   --    Liver Function Tests:  Recent Labs Lab 02/07/14 1305 02/08/14 0005  AST 13 12  ALT 8 7  ALKPHOS 49 46  BILITOT 0.5 0.2*  PROT 5.5* 5.3*  ALBUMIN 3.1* 2.9*   No results found  for this basename: LIPASE, AMYLASE,  in the last 168 hours No results found for this basename: AMMONIA,  in the last 168 hours CBC:  Recent Labs Lab 02/07/14 1305 02/08/14 0005 02/09/14 0539 02/12/14 0728 02/13/14 0315  WBC 5.7 5.0 9.5 8.5 6.4  HGB 10.9* 10.1* 12.0* 11.4* 11.1*  HCT 32.9* 30.3* 35.3* 33.7* 32.7*  MCV 92.9 92.9 94.1 95.2 92.1  PLT 230 221 231 227 257   Cardiac Enzymes:  Recent Labs Lab 02/07/14 1920 02/08/14 0005 02/12/14 0728  TROPONINI 0.30* <0.30 <0.30   BNP: BNP (last 3 results)  Recent Labs  05/17/13 1825 02/12/14 0728  PROBNP 983.2* 9440.0*   CBG:  Recent Labs Lab 02/13/14 0637 02/13/14 1106 02/13/14 1631 02/13/14 2132 02/14/14 0644  GLUCAP 202* 195* 202* 153* 243*       Signed:  Nasim Habeeb  [facility] 02/14/2014, 11:25 AM

## 2014-02-14 NOTE — Progress Notes (Signed)
Patient ID: [name], male   DOB: [birth year], 80 y.o.   MRN: [id number]     Subjective:    SOB has improved  Objective:   Temp:  [97.4 F (36.3 C)-98.5 F (36.9 C)] 98.1 F (36.7 C) (08/30 0900) Pulse Rate:  [52-113] 85 (08/30 0900) Resp:  [18-20] 20 (08/30 0900) BP: (91-121)/(37-82) 94/45 mmHg (08/30 0900) SpO2:  [96 %-100 %] 99 % (08/30 0900) Weight:  [134 lb (60.782 kg)] 134 lb (60.782 kg) (08/30 0645) Last BM Date: 02/13/14  Filed Weights   02/12/14 1302 02/13/14 0512 02/14/14 0645  Weight: 145 lb 8.1 oz (66 kg) 141 lb 4.8 oz (64.093 kg) 134 lb (60.782 kg)    Intake/Output Summary (Last 24 hours) at 02/14/14 1012 Last data filed at 02/14/14 0831  Gross per 24 hour  Intake    960 ml  Output    875 ml  Net     85 ml    Telemetry: afin rates primarily 80-90s, occasional 120s  Exam:  General: NAD  Resp: CTAB  Cardiac: irreg, no m/r/g, no JVD  GI: abdomen soft, NT, ND  MSK:no LE edema  Neuro: no focal deficits    Lab Results:  Basic Metabolic Panel:  Recent Labs Lab 02/07/14 1305  02/12/14 0728 02/13/14 0315 02/14/14 0329  NA  --   < > 139 138 137  K  --   < > 4.1 3.5* 3.3*  CL  --   < > 102 100 97  CO2  --   < > 23 24 26   GLUCOSE  --   < > 128* 177* 283*  BUN  --   < > 15 19 29*  CREATININE 0.97  < > 0.96 0.95 1.03  CALCIUM  --   < > 8.2* 8.4 8.4  MG 1.3*  --  1.2*  --   --   < > = values in this interval not displayed.  Liver Function Tests:  Recent Labs Lab 02/07/14 1305 02/08/14 0005  AST 13 12  ALT 8 7  ALKPHOS 49 46  BILITOT 0.5 0.2*  PROT 5.5* 5.3*  ALBUMIN 3.1* 2.9*    CBC:  Recent Labs Lab 02/09/14 0539 02/12/14 0728 02/13/14 0315  WBC 9.5 8.5 6.4  HGB 12.0* 11.4* 11.1*  HCT 35.3* 33.7* 32.7*  MCV 94.1 95.2 92.1  PLT 231 227 257    Cardiac Enzymes:  Recent Labs Lab 02/07/14 1920 02/08/14 0005 02/12/14 0728  TROPONINI 0.30* <0.30 <0.30    BNP:  Recent Labs  05/17/13 1825 02/12/14 0728    PROBNP 983.2* 9440.0*    Coagulation: No results found for this basename: INR,  in the last 168 hours  ECG:   Medications:   Scheduled Medications: . acarbose  100 mg Oral TID WC  . apixaban  5 mg Oral BID  . [START ON 02/15/2014] aspirin EC  81 mg Oral Once per day on Mon Thu  . atorvastatin  10 mg Oral q1800  . carvedilol  12.5 mg Oral BID WC  . citalopram  10 mg Oral Daily  . finasteride  5 mg Oral Daily  . furosemide  40 mg Intravenous Q12H  . insulin aspart  0-9 Units Subcutaneous TID WC  . levothyroxine  75 mcg Oral QAC breakfast  . lisinopril  10 mg Oral Daily  . potassium chloride  40 mEq Oral Daily     Infusions:     PRN Medications:  acetaminophen, acetaminophen, albuterol, fluticasone, ondansetron (ZOFRAN)  IV, ondansetron     Assessment/Plan   80 yo male hx of afib/flutter recently diagnosed earlier this month, HTN, hypothyroidism, HL, admitted with SOB.   1. Acute on chronic systolic heart failure  - echo 01/17/13/15 LVEF 25-30%, diffuse hypokinesis, moderate AS. Noted decline from echo 11/2012 with LVEF 45-50%. Etiologies of possible drop include progression of CAD, tachycardia induced CM given his new diagnosis of afib/aflutter. His thyroid function is also decreased (TSH 8.240)  - CXR with mild CHF, BNP 9440.  - +485 mL yesterday, net negative 765 mL since admission.Renal function remains stable though uptrend in BUN, he is on lasix 40mg  IV bid . Change to oral lasix today 20mg  daily  - soft blood pressures at times, decrease lisinopril to allow more room to increase coreg in setting of afib with elevated rates.   - would focus on rate control and medical therapy in short term, if LVEF not improved would need consideration for LHC due to his LVEF drop   2. CAD  - CABG in 1996 and PCI to saphenous vein graft to OM in 2007  - mild trop leak earlier in month in setting of afib, negative this admit. EKG shows SR with LBBB that's chronic .   3. Afib/flutter   - from prior notes plan for DCCV in 3-4 weeks on anticoag.  - he is on eliquis, coreg for rate control. Elevated rates at times, yesterday increased coreg to 12.5mg  bid with improved rates - plan still for possible DCCV in 3-4 weeks on anticoag  4. Hypokalemia  - please keep K at 4 and Mg at 2 in setting of arrhythmia    Will have patient ambulate with nursing. If does well reasonable discharge today, will need close follow up within 1-2 weeks. Please notify use if d/c today.         Carlyle Dolly, M.D., F.A.C.C.

## 2014-02-14 NOTE — Progress Notes (Signed)
Patient alert and oriented no c/o  Pain. Ambulate in hall way, no c/o dizziness, no shortness of breath, v/s stable  98.1, HR. 91, BP 98/56, 100%/RA, MD aware. Will d/c patient per order.

## 2014-02-14 NOTE — Progress Notes (Signed)
Patient is a high risk for elopement, and has been reoriented multiple times since start of shift. He has dressed himself and said he is going home x 2. Patient placed back on heart monitor after taking it off earlier in the shift, and reoriented. After re-dressing patient in a hospital gown, and helping him back to bed, patient is now sleeping peacefully. Will continue to monitor closely.  Esperanza Heir, RN

## 2014-02-14 NOTE — Progress Notes (Signed)
Utilization Review Completed.   Jeyden Coffelt, RN, BSN Nurse Case Manager  

## 2014-02-14 NOTE — Progress Notes (Signed)
Patient alert and oriented, no pain, v/s stable, iv d/c. D/c instruction given to patient son and patient, both verbalized understanding. Patient d/c per order.

## 2014-02-23 ENCOUNTER — Ambulatory Visit: Payer: Medicare Other | Admitting: Interventional Cardiology

## 2014-03-03 NOTE — Progress Notes (Signed)
OT Note - Addendum   02/09/14 1005  OT G-codes **NOT FOR INPATIENT CLASS**  Functional Assessment Tool Used clinical judgement  Functional Limitation Self care  Self Care Current Status [phone]) CI  Self Care Goal Status ([id number]) CI  Self Care Discharge Status [phone]) CI  [facility], OTR/L  [phone] 02/09/2014

## 2014-03-17 ENCOUNTER — Encounter (HOSPITAL_COMMUNITY): Payer: Self-pay | Admitting: Emergency Medicine

## 2014-03-17 ENCOUNTER — Inpatient Hospital Stay (HOSPITAL_COMMUNITY): Payer: Medicare Other

## 2014-03-17 ENCOUNTER — Emergency Department (HOSPITAL_COMMUNITY): Payer: Medicare Other

## 2014-03-17 ENCOUNTER — Inpatient Hospital Stay (HOSPITAL_COMMUNITY)
Admission: EM | Admit: 2014-03-17 | Discharge: 2014-03-19 | DRG: 292 | Disposition: A | Discharge status: Home Health | Payer: Medicare Other | Attending: Internal Medicine | Admitting: Internal Medicine

## 2014-03-17 DIAGNOSIS — Z7982 Long term (current) use of aspirin: Secondary | ICD-10-CM | POA: Diagnosis not present

## 2014-03-17 DIAGNOSIS — D649 Anemia, unspecified: Secondary | ICD-10-CM | POA: Diagnosis present

## 2014-03-17 DIAGNOSIS — R410 Disorientation, unspecified: Secondary | ICD-10-CM | POA: Diagnosis present

## 2014-03-17 DIAGNOSIS — I10 Essential (primary) hypertension: Secondary | ICD-10-CM | POA: Diagnosis present

## 2014-03-17 DIAGNOSIS — J440 Chronic obstructive pulmonary disease with acute lower respiratory infection: Secondary | ICD-10-CM | POA: Diagnosis present

## 2014-03-17 DIAGNOSIS — I447 Left bundle-branch block, unspecified: Secondary | ICD-10-CM | POA: Diagnosis present

## 2014-03-17 DIAGNOSIS — K219 Gastro-esophageal reflux disease without esophagitis: Secondary | ICD-10-CM | POA: Diagnosis present

## 2014-03-17 DIAGNOSIS — J441 Chronic obstructive pulmonary disease with (acute) exacerbation: Secondary | ICD-10-CM

## 2014-03-17 DIAGNOSIS — I2510 Atherosclerotic heart disease of native coronary artery without angina pectoris: Secondary | ICD-10-CM | POA: Diagnosis present

## 2014-03-17 DIAGNOSIS — I481 Persistent atrial fibrillation: Secondary | ICD-10-CM | POA: Diagnosis present

## 2014-03-17 DIAGNOSIS — Z951 Presence of aortocoronary bypass graft: Secondary | ICD-10-CM | POA: Diagnosis not present

## 2014-03-17 DIAGNOSIS — I350 Nonrheumatic aortic (valve) stenosis: Secondary | ICD-10-CM | POA: Diagnosis present

## 2014-03-17 DIAGNOSIS — Z8249 Family history of ischemic heart disease and other diseases of the circulatory system: Secondary | ICD-10-CM | POA: Diagnosis not present

## 2014-03-17 DIAGNOSIS — I739 Peripheral vascular disease, unspecified: Secondary | ICD-10-CM | POA: Diagnosis present

## 2014-03-17 DIAGNOSIS — Z930 Tracheostomy status: Secondary | ICD-10-CM | POA: Diagnosis not present

## 2014-03-17 DIAGNOSIS — E782 Mixed hyperlipidemia: Secondary | ICD-10-CM | POA: Diagnosis present

## 2014-03-17 DIAGNOSIS — Z85819 Personal history of malignant neoplasm of unspecified site of lip, oral cavity, and pharynx: Secondary | ICD-10-CM | POA: Diagnosis present

## 2014-03-17 DIAGNOSIS — W1830XA Fall on same level, unspecified, initial encounter: Secondary | ICD-10-CM | POA: Diagnosis present

## 2014-03-17 DIAGNOSIS — Z87891 Personal history of nicotine dependence: Secondary | ICD-10-CM | POA: Diagnosis not present

## 2014-03-17 DIAGNOSIS — J209 Acute bronchitis, unspecified: Secondary | ICD-10-CM | POA: Diagnosis present

## 2014-03-17 DIAGNOSIS — R0602 Shortness of breath: Secondary | ICD-10-CM

## 2014-03-17 DIAGNOSIS — I252 Old myocardial infarction: Secondary | ICD-10-CM

## 2014-03-17 DIAGNOSIS — G629 Polyneuropathy, unspecified: Secondary | ICD-10-CM | POA: Diagnosis present

## 2014-03-17 DIAGNOSIS — E039 Hypothyroidism, unspecified: Secondary | ICD-10-CM | POA: Diagnosis present

## 2014-03-17 DIAGNOSIS — I6523 Occlusion and stenosis of bilateral carotid arteries: Secondary | ICD-10-CM | POA: Diagnosis present

## 2014-03-17 DIAGNOSIS — I5023 Acute on chronic systolic (congestive) heart failure: Secondary | ICD-10-CM | POA: Diagnosis present

## 2014-03-17 DIAGNOSIS — I255 Ischemic cardiomyopathy: Secondary | ICD-10-CM | POA: Diagnosis present

## 2014-03-17 DIAGNOSIS — Z833 Family history of diabetes mellitus: Secondary | ICD-10-CM

## 2014-03-17 DIAGNOSIS — H9190 Unspecified hearing loss, unspecified ear: Secondary | ICD-10-CM | POA: Diagnosis present

## 2014-03-17 DIAGNOSIS — I25810 Atherosclerosis of coronary artery bypass graft(s) without angina pectoris: Secondary | ICD-10-CM

## 2014-03-17 DIAGNOSIS — E119 Type 2 diabetes mellitus without complications: Secondary | ICD-10-CM

## 2014-03-17 DIAGNOSIS — W19XXXA Unspecified fall, initial encounter: Secondary | ICD-10-CM | POA: Diagnosis present

## 2014-03-17 DIAGNOSIS — I4891 Unspecified atrial fibrillation: Secondary | ICD-10-CM

## 2014-03-17 DIAGNOSIS — Z7901 Long term (current) use of anticoagulants: Secondary | ICD-10-CM | POA: Diagnosis not present

## 2014-03-17 DIAGNOSIS — I429 Cardiomyopathy, unspecified: Secondary | ICD-10-CM

## 2014-03-17 DIAGNOSIS — I509 Heart failure, unspecified: Secondary | ICD-10-CM

## 2014-03-17 DIAGNOSIS — I480 Paroxysmal atrial fibrillation: Secondary | ICD-10-CM

## 2014-03-17 DIAGNOSIS — I4819 Other persistent atrial fibrillation: Secondary | ICD-10-CM

## 2014-03-17 HISTORY — DX: Nonrheumatic aortic (valve) stenosis: I35.0

## 2014-03-17 HISTORY — DX: Heart failure, unspecified: I50.9

## 2014-03-17 HISTORY — DX: Personal history of malignant neoplasm of unspecified site of lip, oral cavity, and pharynx: Z85.819

## 2014-03-17 HISTORY — DX: Left bundle-branch block, unspecified: I44.7

## 2014-03-17 HISTORY — DX: Long term (current) use of anticoagulants: Z79.01

## 2014-03-17 HISTORY — DX: Ischemic cardiomyopathy: I25.5

## 2014-03-17 LAB — CBC
HCT: 33.2 % — ABNORMAL LOW (ref 39.0–52.0)
Hemoglobin: 11.1 g/dL — ABNORMAL LOW (ref 13.0–17.0)
MCH: 31.9 pg (ref 26.0–34.0)
MCHC: 33.4 g/dL (ref 30.0–36.0)
MCV: 95.4 fL (ref 78.0–100.0)
PLATELETS: 241 10*3/uL (ref 150–400)
RBC: 3.48 MIL/uL — ABNORMAL LOW (ref 4.22–5.81)
RDW: 13.8 % (ref 11.5–15.5)
WBC: 5 10*3/uL (ref 4.0–10.5)

## 2014-03-17 LAB — COMPREHENSIVE METABOLIC PANEL
ALK PHOS: 72 U/L (ref 39–117)
ALT: 44 U/L (ref 0–53)
ANION GAP: 14 (ref 5–15)
AST: 24 U/L (ref 0–37)
Albumin: 3.2 g/dL — ABNORMAL LOW (ref 3.5–5.2)
BILIRUBIN TOTAL: 0.3 mg/dL (ref 0.3–1.2)
BUN: 18 mg/dL (ref 6–23)
CHLORIDE: 101 meq/L (ref 96–112)
CO2: 25 meq/L (ref 19–32)
Calcium: 8.2 mg/dL — ABNORMAL LOW (ref 8.4–10.5)
Creatinine, Ser: 0.85 mg/dL (ref 0.50–1.35)
GFR, EST AFRICAN AMERICAN: 87 mL/min — AB (ref >90)
GFR, EST NON AFRICAN AMERICAN: 75 mL/min — AB (ref >90)
GLUCOSE: 165 mg/dL — AB (ref 70–99)
POTASSIUM: 4.4 meq/L (ref 3.7–5.3)
Sodium: 140 mEq/L (ref 137–147)
Total Protein: 6.5 g/dL (ref 6.0–8.3)

## 2014-03-17 LAB — RETICULOCYTES
RBC.: 3.79 MIL/uL — ABNORMAL LOW (ref 4.22–5.81)
RETIC COUNT ABSOLUTE: 56.9 10*3/uL (ref 19.0–186.0)
RETIC CT PCT: 1.5 % (ref 0.4–3.1)

## 2014-03-17 LAB — GLUCOSE, CAPILLARY
Glucose-Capillary: 358 mg/dL — ABNORMAL HIGH (ref 70–99)
Glucose-Capillary: 127 mg/dL — ABNORMAL HIGH (ref 70–99)

## 2014-03-17 LAB — PRO B NATRIURETIC PEPTIDE: PRO B NATRI PEPTIDE: 5030 pg/mL — AB (ref 0–450)

## 2014-03-17 LAB — TROPONIN I
Troponin I: <0.3 ng/mL (ref <0.30)
Troponin I: <0.3 ng/mL (ref <0.30)

## 2014-03-17 LAB — TSH: TSH: 9.12 u[IU]/mL — ABNORMAL HIGH (ref 0.350–4.500)

## 2014-03-17 LAB — MAGNESIUM: Magnesium: 1.1 mg/dL — ABNORMAL LOW (ref 1.5–2.5)

## 2014-03-17 MED ORDER — IPRATROPIUM BROMIDE 0.02 % IN SOLN
0.5000 mg | RESPIRATORY_TRACT | Status: DC
  Administered 2014-03-17: 0.5 mg via RESPIRATORY_TRACT

## 2014-03-17 MED ORDER — LISINOPRIL 5 MG PO TABS
5.0000 mg | ORAL_TABLET | Freq: Every day | ORAL | Status: DC
  Administered 2014-03-17 – 2014-03-19 (×3): 5 mg via ORAL
  Filled 2014-03-17 (×3): qty 1

## 2014-03-17 MED ORDER — ASPIRIN EC 81 MG PO TBEC
81.0000 mg | DELAYED_RELEASE_TABLET | ORAL | Status: DC
  Administered 2014-03-18: 81 mg via ORAL
  Filled 2014-03-17: qty 1

## 2014-03-17 MED ORDER — CITALOPRAM HYDROBROMIDE 10 MG PO TABS
10.0000 mg | ORAL_TABLET | Freq: Every day | ORAL | Status: DC
  Administered 2014-03-17 – 2014-03-19 (×3): 10 mg via ORAL
  Filled 2014-03-17 (×3): qty 1

## 2014-03-17 MED ORDER — LEVALBUTEROL HCL 0.63 MG/3ML IN NEBU
0.6300 mg | INHALATION_SOLUTION | RESPIRATORY_TRACT | Status: DC
  Administered 2014-03-17: 0.63 mg via RESPIRATORY_TRACT
  Filled 2014-03-17 (×6): qty 3

## 2014-03-17 MED ORDER — APIXABAN 5 MG PO TABS
5.0000 mg | ORAL_TABLET | Freq: Two times a day (BID) | ORAL | Status: DC
  Administered 2014-03-17 – 2014-03-19 (×4): 5 mg via ORAL
  Filled 2014-03-17 (×5): qty 1

## 2014-03-17 MED ORDER — SODIUM CHLORIDE 0.9 % IJ SOLN: 3.0000 mL | INTRAMUSCULAR | Status: DC | PRN

## 2014-03-17 MED ORDER — APIXABAN 2.5 MG PO TABS
2.5000 mg | ORAL_TABLET | Freq: Two times a day (BID) | ORAL | Status: DC
  Filled 2014-03-17: qty 1

## 2014-03-17 MED ORDER — IPRATROPIUM BROMIDE 0.02 % IN SOLN
0.5000 mg | RESPIRATORY_TRACT | Status: DC | PRN
  Filled 2014-03-17: qty 2.5

## 2014-03-17 MED ORDER — PREDNISONE 50 MG PO TABS
60.0000 mg | ORAL_TABLET | Freq: Every day | ORAL | Status: DC
  Administered 2014-03-18 – 2014-03-19 (×2): 60 mg via ORAL
  Filled 2014-03-17 (×3): qty 1

## 2014-03-17 MED ORDER — CARVEDILOL 6.25 MG PO TABS
6.2500 mg | ORAL_TABLET | Freq: Two times a day (BID) | ORAL | Status: DC
  Administered 2014-03-17 – 2014-03-19 (×4): 6.25 mg via ORAL
  Filled 2014-03-17 (×6): qty 1

## 2014-03-17 MED ORDER — FERROUS SULFATE 325 (65 FE) MG PO TABS
325.0000 mg | ORAL_TABLET | Freq: Every day | ORAL | Status: DC
  Administered 2014-03-17 – 2014-03-19 (×3): 325 mg via ORAL
  Filled 2014-03-17 (×4): qty 1

## 2014-03-17 MED ORDER — ATORVASTATIN CALCIUM 10 MG PO TABS
10.0000 mg | ORAL_TABLET | Freq: Every day | ORAL | Status: DC
  Administered 2014-03-17 – 2014-03-18 (×2): 10 mg via ORAL
  Filled 2014-03-17 (×3): qty 1

## 2014-03-17 MED ORDER — LEVOFLOXACIN IN D5W 500 MG/100ML IV SOLN
500.0000 mg | INTRAVENOUS | Status: DC
  Administered 2014-03-18: 500 mg via INTRAVENOUS
  Filled 2014-03-17 (×2): qty 100

## 2014-03-17 MED ORDER — FINASTERIDE 5 MG PO TABS
5.0000 mg | ORAL_TABLET | Freq: Every day | ORAL | Status: DC
  Administered 2014-03-17 – 2014-03-19 (×3): 5 mg via ORAL
  Filled 2014-03-17 (×3): qty 1

## 2014-03-17 MED ORDER — LEVALBUTEROL HCL 0.63 MG/3ML IN NEBU
0.6300 mg | INHALATION_SOLUTION | Freq: Three times a day (TID) | RESPIRATORY_TRACT | Status: DC
  Administered 2014-03-17 – 2014-03-18 (×2): 0.63 mg via RESPIRATORY_TRACT
  Filled 2014-03-17 (×5): qty 3

## 2014-03-17 MED ORDER — FUROSEMIDE 40 MG PO TABS
40.0000 mg | ORAL_TABLET | Freq: Every day | ORAL | Status: DC
  Administered 2014-03-17 – 2014-03-19 (×3): 40 mg via ORAL
  Filled 2014-03-17 (×3): qty 1

## 2014-03-17 MED ORDER — SODIUM CHLORIDE 0.9 % IV SOLN
250.0000 mL | INTRAVENOUS | Status: DC | PRN
Start: 2014-03-17 — End: 2014-03-18

## 2014-03-17 MED ORDER — INSULIN ASPART 100 UNIT/ML ~~LOC~~ SOLN
0.0000 [IU] | Freq: Three times a day (TID) | SUBCUTANEOUS | Status: DC
  Administered 2014-03-17: 9 [IU] via SUBCUTANEOUS
  Administered 2014-03-18: 3 [IU] via SUBCUTANEOUS
  Administered 2014-03-18: 5 [IU] via SUBCUTANEOUS
  Administered 2014-03-19: 3 [IU] via SUBCUTANEOUS
  Administered 2014-03-19: 9 [IU] via SUBCUTANEOUS

## 2014-03-17 MED ORDER — ACETAMINOPHEN 325 MG PO TABS: 650.0000 mg | ORAL_TABLET | ORAL | Status: DC | PRN

## 2014-03-17 MED ORDER — LEVOTHYROXINE SODIUM 75 MCG PO TABS
75.0000 ug | ORAL_TABLET | Freq: Every day | ORAL | Status: DC
  Administered 2014-03-17 – 2014-03-19 (×3): 75 ug via ORAL
  Filled 2014-03-17 (×4): qty 1

## 2014-03-17 MED ORDER — IPRATROPIUM-ALBUTEROL 0.5-2.5 (3) MG/3ML IN SOLN: 3.0000 mL | RESPIRATORY_TRACT | Status: DC

## 2014-03-17 MED ORDER — PANTOPRAZOLE SODIUM 40 MG PO TBEC
40.0000 mg | DELAYED_RELEASE_TABLET | Freq: Every day | ORAL | Status: DC
  Administered 2014-03-17 – 2014-03-19 (×3): 40 mg via ORAL
  Filled 2014-03-17 (×2): qty 1

## 2014-03-17 MED ORDER — FLUTICASONE PROPIONATE 50 MCG/ACT NA SUSP
2.0000 | Freq: Every day | NASAL | Status: DC | PRN
  Filled 2014-03-17: qty 16

## 2014-03-17 MED ORDER — LEVOFLOXACIN IN D5W 750 MG/150ML IV SOLN
750.0000 mg | Freq: Once | INTRAVENOUS | Status: AC
  Administered 2014-03-17: 750 mg via INTRAVENOUS
  Filled 2014-03-17: qty 150

## 2014-03-17 MED ORDER — POTASSIUM CHLORIDE CRYS ER 20 MEQ PO TBCR
20.0000 meq | EXTENDED_RELEASE_TABLET | Freq: Every day | ORAL | Status: DC
  Administered 2014-03-17 – 2014-03-19 (×3): 20 meq via ORAL
  Filled 2014-03-17 (×4): qty 1

## 2014-03-17 MED ORDER — ALPRAZOLAM 0.25 MG PO TABS
0.2500 mg | ORAL_TABLET | Freq: Two times a day (BID) | ORAL | Status: DC | PRN
  Administered 2014-03-18: 0.25 mg via ORAL
  Filled 2014-03-17: qty 1

## 2014-03-17 MED ORDER — SODIUM CHLORIDE 0.9 % IJ SOLN
3.0000 mL | Freq: Two times a day (BID) | INTRAMUSCULAR | Status: DC
  Administered 2014-03-17: 3 mL via INTRAVENOUS

## 2014-03-17 MED ORDER — PREDNISONE 20 MG PO TABS
40.0000 mg | ORAL_TABLET | Freq: Once | ORAL | Status: AC
  Administered 2014-03-17: 40 mg via ORAL
  Filled 2014-03-17: qty 2

## 2014-03-17 MED ORDER — FUROSEMIDE 10 MG/ML IJ SOLN
40.0000 mg | Freq: Two times a day (BID) | INTRAMUSCULAR | Status: DC
  Administered 2014-03-17: 40 mg via INTRAVENOUS
  Filled 2014-03-17: qty 4

## 2014-03-17 MED ORDER — ONDANSETRON HCL 4 MG/2ML IJ SOLN: 4.0000 mg | Freq: Four times a day (QID) | INTRAMUSCULAR | Status: DC | PRN

## 2014-03-17 MED ORDER — FUROSEMIDE 10 MG/ML IJ SOLN: 80.0000 mg | Freq: Once | INTRAMUSCULAR | Status: DC

## 2014-03-17 MED ORDER — GUAIFENESIN ER 600 MG PO TB12
1200.0000 mg | ORAL_TABLET | Freq: Two times a day (BID) | ORAL | Status: DC
  Administered 2014-03-17 – 2014-03-19 (×4): 1200 mg via ORAL
  Filled 2014-03-17 (×5): qty 2

## 2014-03-17 MED ORDER — ASPIRIN EC 81 MG PO TBEC: 81.0000 mg | DELAYED_RELEASE_TABLET | Freq: Every day | ORAL | Status: DC

## 2014-03-17 MED ORDER — LEVALBUTEROL HCL 0.63 MG/3ML IN NEBU: 0.6300 mg | INHALATION_SOLUTION | RESPIRATORY_TRACT | Status: DC | PRN

## 2014-03-17 MED ORDER — POTASSIUM CHLORIDE CRYS ER 20 MEQ PO TBCR: 20.0000 meq | EXTENDED_RELEASE_TABLET | Freq: Two times a day (BID) | ORAL | Status: DC

## 2014-03-17 MED ORDER — IPRATROPIUM BROMIDE 0.02 % IN SOLN
0.5000 mg | Freq: Three times a day (TID) | RESPIRATORY_TRACT | Status: DC
  Administered 2014-03-17 – 2014-03-18 (×2): 0.5 mg via RESPIRATORY_TRACT
  Filled 2014-03-17 (×2): qty 2.5

## 2014-03-17 NOTE — ED Notes (Addendum)
Pt presents with fall this morning, reports falling in bathroom.  Pt reports he has been weak "for a while", reports falling due to weakness.  Pt denies any LOC, reports falling on buttocks, but reports shortness of breath.

## 2014-03-17 NOTE — ED Provider Notes (Signed)
CSN: [id number]     Arrival date & time 03/17/14  7829 History   First MD Initiated Contact with Patient 03/17/14 1006     No chief complaint on file.    (Consider location/radiation/quality/duration/timing/severity/associated sxs/prior Treatment) HPI Comments: Patient reports falling this morning while trying to get to the bathroom.  Reports his leg gave out, and endorses chronic bilateral lower pain.  Denies any chest pain, palpitations, or syncope.  He reports sitting down on the floor.  He did not hit his head.  He denies any muscle skeletal pain: Specifically denies headache, neck pain, lumbar pain or hand/wrist pain.  He reports compliance with Eliquis.  Denies any history of stroke, or recent unilateral weakness, vision changes, or speech problems.  He denies any abdominal pain, melena, or hematochezia; no history of GI bleed.  Denies history of stroke.  History of MI; and NSTEMI approximately 1 year with stent placement.  Recent hospitalization one month ago for A. fib and heart failure.  Additionally, he endorses productive cough and shortness of breath over the last week.  Denies any fevers, chills, or URI symptoms.  Denies dysuria.  No recent antibiotic use.   Patient is a 80 y.o. male presenting with fall and shortness of breath. The history is provided by the patient.  Fall This is a new problem. The current episode started today. Associated symptoms include coughing and weakness. Pertinent negatives include no abdominal pain, arthralgias, change in bowel habit, chest pain, chills, congestion, diaphoresis, fever, headaches, nausea, neck pain, numbness, rash, sore throat, urinary symptoms, vertigo or vomiting. Nothing aggravates the symptoms. He has tried nothing for the symptoms. The treatment provided no relief.  Shortness of Breath Severity:  Moderate Onset quality:  Gradual Duration:  1 week Timing:  Constant Progression:  Unchanged Chronicity:  Recurrent Context: activity    Context: not URI   Relieved by:  Rest Worsened by:  Activity and coughing Ineffective treatments:  None tried Associated symptoms: cough and sputum production   Associated symptoms: no abdominal pain, no chest pain, no diaphoresis, no fever, no headaches, no neck pain, no rash, no sore throat, no syncope, no vomiting and no wheezing   Risk factors: hx of cancer   Risk factors: no hx of PE/DVT and no recent surgery     Past Medical History  Diagnosis Date  . Coronary artery disease     s/p CABG 1996 then PCI of SVG to OM 2007  . Diverticulosis   . Dyslipidemia   . Renal artery stenosis     s/p bilateral stents  . Hiatal hernia   . Colon polyps   . Carotid artery stenosis     50% BILATERAL 2014  . Peripheral neuropathy   . Anemia   . Hypothyroidism   . Nasal polyps   . Hypertension   . Diabetes mellitus   . Throat cancer S/P TRACHECTOMY   . Myocardial infarction   . Peripheral vascular disease     LOWER EXT  . Acute myocardial infarction, unspecified site, initial episode of care   . Atrial fibrillation/flutter   . GERD (gastroesophageal reflux disease)   . Shortness of breath     Hx: of  . HOH (hard of hearing) VERY    Past Surgical History  Procedure Laterality Date  . Coronary artery bypass graft      1996  . Cardiac catheterization      PCI of SVG to OM 2007  . Renal artery stent    .  Tracheostomy    . Appendectomy    . Tonsillectomy     Family History  Problem Relation Age of Onset  . Hypertension Mother   . Heart attack Brother   . Diabetes Son    History  Substance Use Topics  . Smoking status: Former Smoker -- 0.30 packs/day    Types: Cigarettes    Quit date: 06/18/1946  . Smokeless tobacco: Not on file  . Alcohol Use: No    Review of Systems  Constitutional: Negative for fever, chills and diaphoresis.  HENT: Negative for congestion and sore throat.   Respiratory: Positive for cough, sputum production and shortness of breath. Negative for  wheezing.   Cardiovascular: Negative for chest pain and syncope.  Gastrointestinal: Negative for nausea, vomiting, abdominal pain and change in bowel habit.  Musculoskeletal: Negative for arthralgias and neck pain.  Skin: Negative for rash.  Neurological: Positive for weakness. Negative for vertigo, numbness and headaches.      Allergies  Review of patient's allergies indicates no known allergies.  Home Medications   Prior to Admission medications   Medication Sig Start Date End Date Taking? Authorizing Provider  albuterol (PROVENTIL HFA;VENTOLIN HFA) 108 (90 BASE) MCG/ACT inhaler Inhale 2-3 puffs into the lungs at bedtime.    Yes Historical Provider, MD  apixaban (ELIQUIS) 5 MG TABS tablet Take 2.5 mg by mouth 2 (two) times daily.   Yes Historical Provider, MD  aspirin EC 81 MG tablet Take 81 mg by mouth 2 (two) times a week. Mondays and thursdays   Yes Historical Provider, MD  atorvastatin (LIPITOR) 10 MG tablet Take 10 mg by mouth daily.   Yes Historical Provider, MD  carvedilol (COREG) 6.25 MG tablet Take 6.25 mg by mouth 2 (two) times daily with a meal.   Yes Historical Provider, MD  citalopram (CELEXA) 10 MG tablet Take 10 mg by mouth daily.   Yes Historical Provider, MD  ferrous sulfate 325 (65 FE) MG tablet Take 325 mg by mouth daily with breakfast.   Yes Historical Provider, MD  finasteride (PROSCAR) 5 MG tablet Take 5 mg by mouth daily.    Yes Historical Provider, MD  fluticasone (FLONASE) 50 MCG/ACT nasal spray Place 2 sprays into both nostrils daily as needed for rhinitis or allergies.    Yes Historical Provider, MD  furosemide (LASIX) 20 MG tablet Take 1 tablet (20 mg total) by mouth daily. 02/15/14  Yes Domenic Polite, MD  glipiZIDE (GLUCOTROL) 10 MG tablet Take 20 mg by mouth 2 (two) times daily before a meal.    Yes Historical Provider, MD  levothyroxine (SYNTHROID) 75 MCG tablet Take 1 tablet (75 mcg total) by mouth daily before breakfast. 02/09/14  Yes Donne Hazel, MD   lisinopril (PRINIVIL,ZESTRIL) 5 MG tablet Take 1 tablet (5 mg total) by mouth daily. 02/15/14  Yes Domenic Polite, MD  metFORMIN (GLUCOPHAGE) 500 MG tablet Take 2 tablets (1,000 mg total) by mouth 2 (two) times daily with a meal. 11/09/12  Yes [name], MD  potassium chloride SA (K-DUR,KLOR-CON) 20 MEQ tablet Take 20 mEq by mouth 2 (two) times daily.   Yes Historical Provider, MD  terbinafine (LAMISIL) 1 % cream Apply topically 2 (two) times daily. 02/09/14  Yes Donne Hazel, MD   BP 130/71  Pulse 97  Temp(Src) 98.2 F (36.8 C) (Oral)  Resp 18  SpO2 99% Physical Exam  Constitutional: He is oriented to person, place, and time. He appears well-developed. No distress.  HENT:  Head:  Atraumatic.  Mouth/Throat: Oropharynx is clear and moist.  No abrasions  Eyes: EOM are normal. Pupils are equal, round, and reactive to light.  Neck: Normal range of motion.  Tracheostomy present.  No cervical spine tenderness  Cardiovascular: Intact distal pulses.   Irregularly irregular rhythm without RVR; 3/6 systolic murmur  Pulmonary/Chest: Effort normal. No respiratory distress. He has wheezes.  Coarse rhonchi throughout left lobe  Abdominal: Soft. Bowel sounds are normal. There is no tenderness.  Musculoskeletal: He exhibits no edema.  Neurological: He is alert and oriented to person, place, and time. No cranial nerve deficit. He exhibits normal muscle tone. Coordination normal.  Cerebellar testing is normal in upper and lower extremity with point-to-point testing, and rapid alternating movements  Skin: Skin is warm. No rash noted. He is not diaphoretic.    ED Course  Procedures (including critical care time) Labs Review Labs Reviewed  COMPREHENSIVE METABOLIC PANEL - Abnormal; Notable for the following:    Glucose, Bld 165 (*)    Calcium 8.2 (*)    Albumin 3.2 (*)    GFR calc non Af Amer 75 (*)    GFR calc Af Amer 87 (*)    All other components within normal limits  CBC - Abnormal;  Notable for the following:    RBC 3.48 (*)    Hemoglobin 11.1 (*)    HCT 33.2 (*)    All other components within normal limits  PRO B NATRIURETIC PEPTIDE - Abnormal; Notable for the following:    Pro B Natriuretic peptide (BNP) 5030.0 (*)    All other components within normal limits  TROPONIN I    Imaging Review Dg Chest Portable 1 View  03/17/2014   CLINICAL DATA:  Chest pain and congestion.  EXAM: PORTABLE CHEST - 1 VIEW  COMPARISON:  02/12/2014.  FINDINGS: 1048 hrs. There is persistent bibasilar interstitial airspace opacity with probable small left pleural effusion. Cardiopericardial silhouette is at upper limits of normal for size. Imaged bony structures of the thorax are intact. Telemetry leads overlie the chest.  IMPRESSION: Stable. Bibasilar interstitial and alveolar opacities may be related to infection although dependent edema could have this appearance.   Electronically Signed   By: Misty Stanley M.D.   On: 03/17/2014 11:13     EKG Interpretation   Date/Time:  Wednesday March 17 2014 10:30:39 EDT Ventricular Rate:  99 PR Interval:    QRS Duration: 158 QT Interval:  433 QTC Calculation: 556 R Axis:   -70 Text Interpretation:  Atrial flutter Left bundle branch block Similar to  prior Confirmed by [facility]  MD, [place] ([id number]) on 03/17/2014 10:48:48 AM      MDM   Final diagnoses:  COPD exacerbation  Acute exacerbation of congestive heart failure  SOB (shortness of breath)  Atrial fibrillation, unspecified  Fall, initial encounter   Patient presents with worsening dyspnea and sputum production consistent with COPD exacerbation.  Chest x-ray with questionable infection versus pulmonary edema.  BNP elevated at 5000 consistent with acute heart failure.  Recent fall  this morning due to weakness.  Do to medical comorbidities, A. Fib HFrEF, Diabetes, and falls at home .  Will admit for treatment of COPD exacerbation and heart failure.  Patient denied hitting his head during  the fall and denies any headache or neck pain; although on Eliquis will defer CT skin at this time due to normal mental status and neurological exam.  We'll treat COPD exacerbation with prednisone and Levaquin.  Given Lasix IV 80  mg for hardware for acute heart failure.    Olam Idler, MD 03/17/14 [phone]

## 2014-03-17 NOTE — ED Provider Notes (Signed)
I saw and evaluated the patient, reviewed the resident's note and I agree with the findings and plan.   EKG Interpretation   Date/Time:  Wednesday March 17 2014 10:30:39 EDT Ventricular Rate:  99 PR Interval:    QRS Duration: 158 QT Interval:  433 QTC Calculation: 556 R Axis:   -70 Text Interpretation:  Atrial flutter Left bundle branch block Similar to  prior Confirmed by [facility]  MD, [place] ([id number]) on 03/17/2014 10:48:48 AM      78M with cough, general malaise for one week. Had fall this morning, mechanical, no head injury. Some SOB here, wheezes on exam without acute respiratory distress. Will treat for COPD exacerbation. Infection vs edema on exam, with increased sputum production will treat for HCAP. Admitted.  Evelina Bucy, MD 03/17/14 [phone]

## 2014-03-17 NOTE — Consult Note (Signed)
Reason for Consult:   Elevated BNP  Requesting Physician: Mary Sella  HPI: This is a 80 y.o. male with a past medical history significant for CAD, s/p CABG in '96, PCI in '07. He has ICM with an EF of 25-30% and mild to moderate AS Aug 2015. He presented 02/07/14 with AF and was started on Eliquis with plans for an OP cardioversion after 4 weeks. He was discharged 02/09/14 and re admitted 02/13/14 with CHF (BNP 9000). He was given Lasix and sent home the next day 02/14/14. He lives alone. He has not been to the office as an OP yet. Today he was visiting his wife in the nursing home when he fell in the bathroom. This is his third fall in the last two months. He came to the ER and his BNP was 5000. He had some markings on his CXR that could have been from CHF. He was not SOB. He was treated with a diuretic in the ER as well as breathing treatments and is seen now in consult. He is currently comfortable in bed.   PMHx:  Past Medical History  Diagnosis Date  . Coronary artery disease     s/p CABG 1996 then PCI of SVG to OM 2007  . Diverticulosis   . Dyslipidemia   . Renal artery stenosis     s/p bilateral stents  . Hiatal hernia   . Colon polyps   . Carotid artery stenosis     50% BILATERAL 2014  . Peripheral neuropathy   . Anemia   . Hypothyroidism   . Nasal polyps   . Hypertension   . Diabetes mellitus   . Throat cancer S/P TRACHECTOMY   . Myocardial infarction   . Peripheral vascular disease     LOWER EXT  . Atrial fibrillation/flutter Aug 2015  . GERD (gastroesophageal reflux disease)   . Shortness of breath     Hx: of  . HOH (hard of hearing) VERY   . Left bundle branch block (LBBB) on electrocardiogram 03/17/2014  . Ischemic cardiomyopathy Aug 2015    EF 25-30%   . History of throat cancer     s/p trach  . Aortic stenosis Aug 2015    mild to moderate  . Chronic anticoagulation Aug 2015    Eliquis    Past Surgical History  Procedure Laterality Date  .  Coronary artery bypass graft      1996  . Cardiac catheterization      PCI of SVG to OM 2007  . Renal artery stent    . Tracheostomy    . Appendectomy    . Tonsillectomy    . Sp pta peripheral  March 2013    Lt SFA (Dr Saundra Shelling)  . Sp pta peripheral  May 2013    Rt SFA (Dr Bridgett Larsson)    SOCHx:  reports that he quit smoking about 67 years ago. His smoking use included Cigarettes. He smoked 0.30 packs per day. He does not have any smokeless tobacco history on file. He reports that he does not drink alcohol or use illicit drugs.  FAMHx: Family History  Problem Relation Age of Onset  . Hypertension Mother   . Heart attack Brother   . Diabetes Son     ALLERGIES: No Known Allergies  ROS: Pertinent items are noted in HPI. See H&P for complete details. He denies angina, his falls seem to be mechanical. His daughter says his gait is "shuffling"  and unsteady.   HOME MEDICATIONS: Prior to Admission medications   Medication Sig Start Date End Date Taking? Authorizing Provider  albuterol (PROVENTIL HFA;VENTOLIN HFA) 108 (90 BASE) MCG/ACT inhaler Inhale 2-3 puffs into the lungs at bedtime.    Yes Historical Provider, MD  apixaban (ELIQUIS) 5 MG TABS tablet Take 2.5 mg by mouth 2 (two) times daily.   Yes Historical Provider, MD  aspirin EC 81 MG tablet Take 81 mg by mouth 2 (two) times a week. Mondays and thursdays   Yes Historical Provider, MD  atorvastatin (LIPITOR) 10 MG tablet Take 10 mg by mouth daily.   Yes Historical Provider, MD  carvedilol (COREG) 6.25 MG tablet Take 6.25 mg by mouth 2 (two) times daily with a meal.   Yes Historical Provider, MD  citalopram (CELEXA) 10 MG tablet Take 10 mg by mouth daily.   Yes Historical Provider, MD  ferrous sulfate 325 (65 FE) MG tablet Take 325 mg by mouth daily with breakfast.   Yes Historical Provider, MD  finasteride (PROSCAR) 5 MG tablet Take 5 mg by mouth daily.    Yes Historical Provider, MD  fluticasone (FLONASE) 50 MCG/ACT nasal spray Place 2  sprays into both nostrils daily as needed for rhinitis or allergies.    Yes Historical Provider, MD  furosemide (LASIX) 20 MG tablet Take 1 tablet (20 mg total) by mouth daily. 02/15/14  Yes Domenic Polite, MD  glipiZIDE (GLUCOTROL) 10 MG tablet Take 20 mg by mouth 2 (two) times daily before a meal.    Yes Historical Provider, MD  levothyroxine (SYNTHROID) 75 MCG tablet Take 1 tablet (75 mcg total) by mouth daily before breakfast. 02/09/14  Yes Donne Hazel, MD  lisinopril (PRINIVIL,ZESTRIL) 5 MG tablet Take 1 tablet (5 mg total) by mouth daily. 02/15/14  Yes Domenic Polite, MD  metFORMIN (GLUCOPHAGE) 500 MG tablet Take 2 tablets (1,000 mg total) by mouth 2 (two) times daily with a meal. 11/09/12  Yes [name], MD  potassium chloride SA (K-DUR,KLOR-CON) 20 MEQ tablet Take 20 mEq by mouth 2 (two) times daily.   Yes Historical Provider, MD  terbinafine (LAMISIL) 1 % cream Apply topically 2 (two) times daily. 02/09/14  Yes Donne Hazel, MD    HOSPITAL MEDICATIONS: I have reviewed the patient's current medications.  VITALS: Blood pressure 112/66, pulse 97, temperature 98.2 F (36.8 C), temperature source Oral, resp. rate 24, SpO2 100.00%.  PHYSICAL EXAM: General appearance: alert, cooperative and no distress Neck: no carotid bruit, no JVD and s/p trach Lungs: few scattered rhonchi Heart: irregularly irregular rhythm Abdomen: soft, non-tender; bowel sounds normal; no masses,  no organomegaly Extremities: extremities normal, atraumatic, no cyanosis or edema Pulses: 2+ and symmetric Skin: Skin color, texture, turgor normal. No rashes or lesions Neurologic: Grossly normal  LABS: Results for orders placed during the hospital encounter of 03/17/14 (from the past 24 hour(s))  COMPREHENSIVE METABOLIC PANEL     Status: Abnormal   Collection Time    03/17/14 10:30 AM      Result Value Ref Range   Sodium 140  137 - 147 mEq/L   Potassium 4.4  3.7 - 5.3 mEq/L   Chloride 101  96 - 112  mEq/L   CO2 25  19 - 32 mEq/L   Glucose, Bld 165 (*) 70 - 99 mg/dL   BUN 18  6 - 23 mg/dL   Creatinine, Ser 0.85  0.50 - 1.35 mg/dL   Calcium 8.2 (*) 8.4 - 10.5 mg/dL  Total Protein 6.5  6.0 - 8.3 g/dL   Albumin 3.2 (*) 3.5 - 5.2 g/dL   AST 24  0 - 37 U/L   ALT 44  0 - 53 U/L   Alkaline Phosphatase 72  39 - 117 U/L   Total Bilirubin 0.3  0.3 - 1.2 mg/dL   GFR calc non Af Amer 75 (*) >90 mL/min   GFR calc Af Amer 87 (*) >90 mL/min   Anion gap 14  5 - 15  TROPONIN I     Status: None   Collection Time    03/17/14 10:30 AM      Result Value Ref Range   Troponin I <0.30  <0.30 ng/mL  CBC     Status: Abnormal   Collection Time    03/17/14 10:30 AM      Result Value Ref Range   WBC 5.0  4.0 - 10.5 K/uL   RBC 3.48 (*) 4.22 - 5.81 MIL/uL   Hemoglobin 11.1 (*) 13.0 - 17.0 g/dL   HCT 33.2 (*) 39.0 - 52.0 %   MCV 95.4  78.0 - 100.0 fL   MCH 31.9  26.0 - 34.0 pg   MCHC 33.4  30.0 - 36.0 g/dL   RDW 13.8  11.5 - 15.5 %   Platelets 241  150 - 400 K/uL  PRO B NATRIURETIC PEPTIDE     Status: Abnormal   Collection Time    03/17/14 10:30 AM      Result Value Ref Range   Pro B Natriuretic peptide (BNP) 5030.0 (*) 0 - 450 pg/mL    EKG: AF with LBBB  IMAGING: Ct Head Wo Contrast 03/17/2014     IMPRESSION: Mild diffuse cortical atrophy. Mild chronic ischemic white matter disease. Old left periventricular white matter infarction. No occur acute intracranial abnormality seen.  There is complete opacification of the left frontal and maxillary sinuses, as well as significant opacification of the left ethmoid sinuses. This is concerning for left frontal and maxillary mucoceles most likely due to severe mucosal hypertrophy and inflammation of the left nasal turbinates.   Electronically Signed   By: Sabino Dick M.D.   On: 03/17/2014 14:15   Dg Chest Portable 1 View 03/17/2014   CLINICAL DATA:  Chest pain and congestion.  EXAM: PORTABLE CHEST - IMPRESSION: Stable. Bibasilar interstitial and alveolar  opacities may be related to infection although dependent edema could have this appearance.   Electronically Signed   By: Misty Stanley M.D.   On: 03/17/2014 11:13    IMPRESSION: Principal Problem:   Acute on chronic systolic CHF (congestive heart failure) Active Problems:   A-fib   Fall   DM (diabetes mellitus)   COPD with acute bronchitis   Cardiomyopathy- EF 25-30%   Chronic anticoagulation   Hypertension   Hypothyroidism   Peripheral vascular disease   Mixed hyperlipidemia   Left bundle branch block (LBBB) on electrocardiogram   Aortic stenosis   History of throat cancer   RECOMMENDATION: MD to see.   Time Spent Directly with Patient: 40 minutes  Erlene Quan [phone] beeper 03/17/2014, 4:03 PM  As above, patient seen and examined. Briefly he is an 80 year old male with past medical history of coronary artery disease, PCI, mild to moderate aortic stenosis, history of throat cancer status post tracheotomy, hypertension, hyperlipidemia for evaluation of atrial flutter and congestive heart failure. Patient's last echocardiogram showed decreased LV function compared to previous. Ejection fraction was 25-30%. Mild to moderate aortic stenosis.  Moderate to  severe left atrial enlargement and mild to moderate tricuspid regurgitation. Patient noted to be in atypical flutter in August of 2015. Patient was initiated on apixaban 5 mg by mouth twice a day on August 25. Plan was for outpatient cardioversion. He was admitted with congestive heart failure. The patient describes mild dyspnea on exertion and increased fatigue. He fell today because of unsteady gait. He denies syncope. No chest pain or pedal edema. Cardiology asked to evaluate. Patient appears to have increased CHF symptoms with atrial arrhythmias. His daughter states that he has been taking apixaban 5 mg twice a day since August 25. I would therefore recommend proceeding with cardioversion tomorrow (daughter will check bottles at  home and verify dose). He would need to remain on this medication at least 4 weeks afterwards. There is some question as to whether he can't afford his medication moving forward. They have agreed to continue for at least 4 weeks afterwards and then change to Coumadin in the future if he is unable to afford. If he does not hold sinus rhythm he would likely need amiodarone. Would then plan on repeating echocardiogram in approximately 8 weeks. If ejection fraction remains decreased would consider nuclear study to screen for ischemia. Note he did fall earlier today but attributes this to unsteady gait. I have asked him to carry a cane. If he has more frequent falls in the future we would need to consider discontinuing anticoagulation. Otherwise would continue present cardiac medications. Kirk Ruths

## 2014-03-17 NOTE — H&P (Signed)
[facility] History and Physical  [name] XBD:[id number] DOB: September 24, 1922 DOA: 03/17/2014  Referring physician: Dr. Mingo Amber PCP: Mathews Argyle, MD   Chief Complaint: Fall  HPI: [name] is a 80 y.o. male  With history of A. fib on anticoagulation, coronary artery disease status post CABG in 1996 to PCI of the SVG to the OM in 2007, cardiomyopathy with EF of 25-30% per 2-D echo of 01/2014, bilateral carotid artery stenosis 50% bilaterally, diabetes, hypertension, gastroesophageal reflux disease, status post tracheostomy who presents to the ED from home with a fall. Patient recently hospitalized from 02/12/2014 to 02/14/2053 acute on chronic systolic CHF exacerbation. Patient states when to use the bathroom on the morning of admission and subsequently fell down and laid back down. Daughter-in-law stated that when she got to the house he was in the living room trying to put his pants on which was unable to do with generalized weakness. Patient endorses compliance with his medications. Patient endorses a heart healthy diet. Patient denies any fevers, no chills, no nausea, no vomiting, no shortness of breath, no weight gain, no lower extremity edema, no syncope, no melena, no hematemesis, no hematochezia. Patient denies any orthopnea, no paroxysmal nocturnal dyspnea. Patient does endorse a productive cough whitish sputum ongoing for the past several weeks. Patient was seen in the emergency room comprehensive metabolic profile and albumin of 3.2 otherwise was within normal limits. Versed of troponin was negative. Pro BNP was 5030. CBC had a hemoglobin of 11.1 otherwise was within normal limits. Patient was seen in the emergency room, chest x-ray showed stable bibasilar interstitial and alveolar opacities be related to infection although dependent edema could have this appearance. Patient was given a dose of IV Levaquin. [facility] were called to admit the patient for further  evaluation and management.   Review of Systems: As per history of present illness otherwise negative. Constitutional:  No weight loss, night sweats, Fevers, chills, fatigue.  HEENT:  No headaches, Difficulty swallowing,Tooth/dental problems,Sore throat,  No sneezing, itching, ear ache, nasal congestion, post nasal drip,  Cardio-vascular:  No chest pain, Orthopnea, PND, swelling in lower extremities, anasarca, dizziness, palpitations  GI:  No heartburn, indigestion, abdominal pain, nausea, vomiting, diarrhea, change in bowel habits, loss of appetite  Resp:  No shortness of breath with exertion or at rest. No excess mucus, no productive cough, No non-productive cough, No coughing up of blood.No change in color of mucus.No wheezing.No chest wall deformity  Skin:  no rash or lesions.  GU:  no dysuria, change in color of urine, no urgency or frequency. No flank pain.  Musculoskeletal:  No joint pain or swelling. No decreased range of motion. No back pain.  Psych:  No change in mood or affect. No depression or anxiety. No memory loss.   Past Medical History  Diagnosis Date  . Coronary artery disease     s/p CABG 1996 then PCI of SVG to OM 2007  . Diverticulosis   . Dyslipidemia   . Renal artery stenosis     s/p bilateral stents  . Hiatal hernia   . Colon polyps   . Carotid artery stenosis     50% BILATERAL 2014  . Peripheral neuropathy   . Anemia   . Hypothyroidism   . Nasal polyps   . Hypertension   . Diabetes mellitus   . Throat cancer S/P TRACHECTOMY   . Myocardial infarction   . Peripheral vascular disease     LOWER EXT  . Acute myocardial infarction,  unspecified site, initial episode of care   . Atrial fibrillation/flutter   . GERD (gastroesophageal reflux disease)   . Shortness of breath     Hx: of  . HOH (hard of hearing) VERY   . Left bundle branch block (LBBB) on electrocardiogram 03/17/2014   Past Surgical History  Procedure Laterality Date  . Coronary  artery bypass graft      1996  . Cardiac catheterization      PCI of SVG to OM 2007  . Renal artery stent    . Tracheostomy    . Appendectomy    . Tonsillectomy     Social History:  reports that he quit smoking about 67 years ago. His smoking use included Cigarettes. He smoked 0.30 packs per day. He does not have any smokeless tobacco history on file. He reports that he does not drink alcohol or use illicit drugs.  No Known Allergies  Family History  Problem Relation Age of Onset  . Hypertension Mother   . Heart attack Brother   . Diabetes Son      Prior to Admission medications   Medication Sig Start Date End Date Taking? Authorizing Provider  albuterol (PROVENTIL HFA;VENTOLIN HFA) 108 (90 BASE) MCG/ACT inhaler Inhale 2-3 puffs into the lungs at bedtime.    Yes Historical Provider, MD  apixaban (ELIQUIS) 5 MG TABS tablet Take 2.5 mg by mouth 2 (two) times daily.   Yes Historical Provider, MD  aspirin EC 81 MG tablet Take 81 mg by mouth 2 (two) times a week. Mondays and thursdays   Yes Historical Provider, MD  atorvastatin (LIPITOR) 10 MG tablet Take 10 mg by mouth daily.   Yes Historical Provider, MD  carvedilol (COREG) 6.25 MG tablet Take 6.25 mg by mouth 2 (two) times daily with a meal.   Yes Historical Provider, MD  citalopram (CELEXA) 10 MG tablet Take 10 mg by mouth daily.   Yes Historical Provider, MD  ferrous sulfate 325 (65 FE) MG tablet Take 325 mg by mouth daily with breakfast.   Yes Historical Provider, MD  finasteride (PROSCAR) 5 MG tablet Take 5 mg by mouth daily.    Yes Historical Provider, MD  fluticasone (FLONASE) 50 MCG/ACT nasal spray Place 2 sprays into both nostrils daily as needed for rhinitis or allergies.    Yes Historical Provider, MD  furosemide (LASIX) 20 MG tablet Take 1 tablet (20 mg total) by mouth daily. 02/15/14  Yes Domenic Polite, MD  glipiZIDE (GLUCOTROL) 10 MG tablet Take 20 mg by mouth 2 (two) times daily before a meal.    Yes Historical Provider,  MD  levothyroxine (SYNTHROID) 75 MCG tablet Take 1 tablet (75 mcg total) by mouth daily before breakfast. 02/09/14  Yes Donne Hazel, MD  lisinopril (PRINIVIL,ZESTRIL) 5 MG tablet Take 1 tablet (5 mg total) by mouth daily. 02/15/14  Yes Domenic Polite, MD  metFORMIN (GLUCOPHAGE) 500 MG tablet Take 2 tablets (1,000 mg total) by mouth 2 (two) times daily with a meal. 11/09/12  Yes [name], MD  potassium chloride SA (K-DUR,KLOR-CON) 20 MEQ tablet Take 20 mEq by mouth 2 (two) times daily.   Yes Historical Provider, MD  terbinafine (LAMISIL) 1 % cream Apply topically 2 (two) times daily. 02/09/14  Yes Donne Hazel, MD   Physical Exam: Filed Vitals:   03/17/14 1259 03/17/14 1300 03/17/14 1315 03/17/14 1330  BP: 97/48 118/73 113/63 115/66  Pulse: 100 101 101 97  Temp:      TempSrc:  Resp: 21 19 20 10   SpO2: 100% 97% 98% 99%    Wt Readings from Last 3 Encounters:  02/14/14 60.782 kg (134 lb)  02/09/14 66.724 kg (147 lb 1.6 oz)  08/20/13 67.586 kg (149 lb)    General:  Elderly gentleman laying on a gurney in no acute cardiopulmonary distress.  Eyes: PERRLA, EOMI, normal lids, irises & conjunctiva ENT: grossly normal hearing, lips & tongue. Stoma site is clear. Neck: no LAD, masses or thyromegaly. + jvd Cardiovascular: Irregularly irregular, no m/r/g. 1+ bilateral LE edema. Telemetry: Atrial fibrillation/atrial flutter Respiratory: Bibasilar crackles/coarse breath sounds. Inspiratory wheezing.  Abdomen: soft, ntnd, positive bowel sounds, no rebound, no guarding Skin: no rash or induration seen on limited exam Musculoskeletal: grossly normal tone BUE/BLE Psychiatric: grossly normal mood and affect, speech fluent and appropriate Neurologic: Alert and oriented x3. Cranial nerves II through XII are grossly intact. No focal deficits.           Labs on Admission:  Basic Metabolic Panel:  Recent Labs Lab 03/17/14 1030  NA 140  K 4.4  CL 101  CO2 25  GLUCOSE 165*  BUN  18  CREATININE 0.85  CALCIUM 8.2*   Liver Function Tests:  Recent Labs Lab 03/17/14 1030  AST 24  ALT 44  ALKPHOS 72  BILITOT 0.3  PROT 6.5  ALBUMIN 3.2*   No results found for this basename: LIPASE, AMYLASE,  in the last 168 hours No results found for this basename: AMMONIA,  in the last 168 hours CBC:  Recent Labs Lab 03/17/14 1030  WBC 5.0  HGB 11.1*  HCT 33.2*  MCV 95.4  PLT 241   Cardiac Enzymes:  Recent Labs Lab 03/17/14 1030  TROPONINI <0.30    BNP (last 3 results)  Recent Labs  05/17/13 1825 02/12/14 0728 03/17/14 1030  PROBNP 983.2* 9440.0* 5030.0*   CBG: No results found for this basename: GLUCAP,  in the last 168 hours  Radiological Exams on Admission: Dg Chest Portable 1 View  03/17/2014   CLINICAL DATA:  Chest pain and congestion.  EXAM: PORTABLE CHEST - 1 VIEW  COMPARISON:  02/12/2014.  FINDINGS: 1048 hrs. There is persistent bibasilar interstitial airspace opacity with probable small left pleural effusion. Cardiopericardial silhouette is at upper limits of normal for size. Imaged bony structures of the thorax are intact. Telemetry leads overlie the chest.  IMPRESSION: Stable. Bibasilar interstitial and alveolar opacities may be related to infection although dependent edema could have this appearance.   Electronically Signed   By: Misty Stanley M.D.   On: 03/17/2014 11:13    EKG: Independently reviewed. atrial fibrillation/atrial flutter, left bundle branch block.  Assessment/Plan Principal Problem:   Acute on chronic systolic CHF (congestive heart failure) Active Problems:   Hypertension   Hypothyroidism   Peripheral vascular disease   Mixed hyperlipidemia   DM (diabetes mellitus)   A-fib   COPD with acute bronchitis   Fall   Left bundle branch block (LBBB) on electrocardiogram   Cardiomyopathy   Acute exacerbation of CHF (congestive heart failure)  #1 acute on chronic systolic heart failure Questionable etiology. Patient  endorses compliant with his medication with his diet. Patient has had a cough for several weeks. Will admit to telemetry. BNP is elevated at 5030. First set of troponin is negative. Cycle cardiac enzymes every 6x3. Check a TSH. Patient with a recent 2-D echo 02/08/2014 with EF of 25-30% with diffuse hypokinesis,and as such will not repeat. Place on Lasix 40 mg IV  every 12 hours. Continue home dose of Coreg, lisinopril, aspirin, eliquis, Lipitor. Will consult cardiology for further evaluation and management.  #2 hypertension Stable. Continue Coreg and lisinopril.  #3 acute bronchitis and COPD Patient with a chronic productive cough over the last several weeks. Chest x-ray would by basilar infiltrates could be edema versus infectious etiology. Patient is afebrile. Will check a sputum Gram stain and culture. Will place empirically on IV Levaquin. Place on oxygen, nebulizers, Mucinex, chest PT. Tracheostomy care per respiratory. Follow.  #4 diabetes mellitus Hemoglobin A1c was 7.9 on 02/07/2014. Hold oral hypoglycemic agents. Place on sliding scale insulin.  #5 atrial fibrillation We'll cycle cardiac enzymes every 6 hours x3. Patient with recent 2-D echo and a such will not repeat. Check a TSH. Continue home regimen of Coreg for rate control. Continue adequate anticoagulation. Cardiology consult. Per family patient has had 2 falls over the past one to 2 weeks and a such will reassess to see whether patient is a anticoagulation candidate. Will defer this to cardiology.  #6 chronic left bundle branch block/cardiomyopathy  #7 falls Personable etiology. Patient denies any syncopal episode. Will check a head CT. PT/OT.  #8 hyperlipidemia Stable. Continue Lipitor.  #9 status post tracheostomy Trach care per respiratory therapy. Patient was stating history of these to be downsized or so to see his oncologist for this. Will consult with pulmonary for further evaluation.  #10 prophylaxis PPI for GI  prophylaxis. eliquis for DVT prophylaxis  Code Status: Full DVT Prophylaxis: On ELIQUIS Family Communication: Updated patient on daughter-in-law at bedside. Disposition Plan: ADMIT TO TELEMETRY  Time spent: [place] MD [facility] Pager [phone]

## 2014-03-18 ENCOUNTER — Encounter (HOSPITAL_COMMUNITY)
Admission: EM | Disposition: A | Discharge status: Home Health | Payer: Self-pay | Source: Home / Self Care | Attending: Internal Medicine

## 2014-03-18 DIAGNOSIS — I10 Essential (primary) hypertension: Secondary | ICD-10-CM

## 2014-03-18 DIAGNOSIS — I480 Paroxysmal atrial fibrillation: Secondary | ICD-10-CM

## 2014-03-18 DIAGNOSIS — J441 Chronic obstructive pulmonary disease with (acute) exacerbation: Secondary | ICD-10-CM

## 2014-03-18 DIAGNOSIS — I5023 Acute on chronic systolic (congestive) heart failure: Principal | ICD-10-CM

## 2014-03-18 DIAGNOSIS — I509 Heart failure, unspecified: Secondary | ICD-10-CM

## 2014-03-18 HISTORY — PX: CARDIOVERSION: SHX1299

## 2014-03-18 LAB — IRON AND TIBC
Iron: 54 ug/dL (ref 42–135)
SATURATION RATIOS: 20 % (ref 20–55)
TIBC: 265 ug/dL (ref 215–435)
UIBC: 211 ug/dL (ref 125–400)

## 2014-03-18 LAB — BASIC METABOLIC PANEL
Anion gap: 14 (ref 5–15)
BUN: 23 mg/dL (ref 6–23)
CO2: 27 mEq/L (ref 19–32)
CREATININE: 1.06 mg/dL (ref 0.50–1.35)
Calcium: 8.4 mg/dL (ref 8.4–10.5)
Chloride: 95 mEq/L — ABNORMAL LOW (ref 96–112)
GFR calc Af Amer: 70 mL/min — ABNORMAL LOW (ref >90)
GFR calc non Af Amer: 60 mL/min — ABNORMAL LOW (ref >90)
Glucose, Bld: 307 mg/dL — ABNORMAL HIGH (ref 70–99)
Potassium: 4.6 mEq/L (ref 3.7–5.3)
Sodium: 136 mEq/L — ABNORMAL LOW (ref 137–147)

## 2014-03-18 LAB — PRO B NATRIURETIC PEPTIDE: Pro B Natriuretic peptide (BNP): 7135 pg/mL — ABNORMAL HIGH (ref 0–450)

## 2014-03-18 LAB — GLUCOSE, CAPILLARY
GLUCOSE-CAPILLARY: 241 mg/dL — AB (ref 70–99)
Glucose-Capillary: 213 mg/dL — ABNORMAL HIGH (ref 70–99)
Glucose-Capillary: 202 mg/dL — ABNORMAL HIGH (ref 70–99)
Glucose-Capillary: 286 mg/dL — ABNORMAL HIGH (ref 70–99)

## 2014-03-18 LAB — FERRITIN: Ferritin: 88 ng/mL (ref 22–322)

## 2014-03-18 LAB — VITAMIN B12: VITAMIN B 12: 237 pg/mL (ref 211–911)

## 2014-03-18 LAB — TROPONIN I

## 2014-03-18 LAB — FOLATE: Folate: 15.9 ng/mL

## 2014-03-18 SURGERY — CARDIOVERSION
Anesthesia: LOCAL

## 2014-03-18 MED ORDER — AMIODARONE IV BOLUS ONLY 150 MG/100ML
150.0000 mg | Freq: Once | INTRAVENOUS | Status: AC
  Administered 2014-03-18: 150 mg via INTRAVENOUS
  Filled 2014-03-18 (×2): qty 100

## 2014-03-18 MED ORDER — SODIUM CHLORIDE 0.9 % IV SOLN: 250.0000 mL | INTRAVENOUS | Status: DC | PRN

## 2014-03-18 MED ORDER — MIDAZOLAM HCL 5 MG/5ML IJ SOLN
INTRAMUSCULAR | Status: AC
  Filled 2014-03-18: qty 5

## 2014-03-18 MED ORDER — LEVALBUTEROL HCL 0.63 MG/3ML IN NEBU
0.6300 mg | INHALATION_SOLUTION | Freq: Two times a day (BID) | RESPIRATORY_TRACT | Status: DC
  Administered 2014-03-18 – 2014-03-19 (×2): 0.63 mg via RESPIRATORY_TRACT
  Filled 2014-03-18 (×4): qty 3

## 2014-03-18 MED ORDER — SODIUM CHLORIDE 0.9 % IJ SOLN: 3.0000 mL | INTRAMUSCULAR | Status: DC | PRN

## 2014-03-18 MED ORDER — ACETAMINOPHEN 325 MG PO TABS: 650.0000 mg | ORAL_TABLET | ORAL | Status: DC | PRN

## 2014-03-18 MED ORDER — ONDANSETRON HCL 4 MG/2ML IJ SOLN: 4.0000 mg | Freq: Four times a day (QID) | INTRAMUSCULAR | Status: DC | PRN

## 2014-03-18 MED ORDER — AMIODARONE HCL 200 MG PO TABS
400.0000 mg | ORAL_TABLET | Freq: Two times a day (BID) | ORAL | Status: DC
  Administered 2014-03-18 – 2014-03-19 (×3): 400 mg via ORAL
  Filled 2014-03-18 (×4): qty 2

## 2014-03-18 MED ORDER — SODIUM CHLORIDE 0.9 % IJ SOLN: 3.0000 mL | Freq: Two times a day (BID) | INTRAMUSCULAR | Status: DC

## 2014-03-18 MED ORDER — METOPROLOL TARTRATE 1 MG/ML IV SOLN
INTRAVENOUS | Status: AC
  Filled 2014-03-18: qty 5

## 2014-03-18 MED ORDER — IPRATROPIUM BROMIDE 0.02 % IN SOLN
0.5000 mg | Freq: Two times a day (BID) | RESPIRATORY_TRACT | Status: DC
  Administered 2014-03-18 – 2014-03-19 (×2): 0.5 mg via RESPIRATORY_TRACT
  Filled 2014-03-18 (×2): qty 2.5

## 2014-03-18 MED ORDER — FENTANYL CITRATE 0.05 MG/ML IJ SOLN
INTRAMUSCULAR | Status: AC
  Filled 2014-03-18: qty 2

## 2014-03-18 NOTE — CV Procedure (Signed)
Electrophysiology procedure note  Procedure: DC cardioversion  Indication: Symptomatic atrial fibrillation  Preoperative diagnosis: atrial fibrillation  Postoperative diagnosis: Atrial fibrillation  Description of procedure: After informed consent was obtained, the patient was taken to the diagnostic electrophysiology laboratory in the fasting state. After the usual preparation, he was sedated with intravenous Versed and fentanyl. He was cardioverted with 200 J of synchronized biphasic energy applied through the electrode dispersive pad in the anterior posterior position.. Sinus rhythm was restored. He was allowed to awaken, and returned to the recovery area in satisfactory condition.  Complications: There were no immediate procedure complications  Conclusion: Successful DC cardioversion and the patient was symptomatic atrial fibrillation.  Cristopher Peru, M.D.

## 2014-03-18 NOTE — Progress Notes (Signed)
Inpatient Diabetes Program Recommendations  AACE/ADA: New Consensus Statement on Inpatient Glycemic Control (2013)  Target Ranges:  Prepandial:   less than 140 mg/dL      Peak postprandial:   less than 180 mg/dL (1-2 hours)      Critically ill patients:  140 - 180 mg/dL   Inpatient Diabetes Program Recommendations Correction (SSI): change to Q4 while NPO  Thank you  Raoul Pitch BSN, RN,CDE Inpatient Diabetes Coordinator [phone] (team pager)

## 2014-03-18 NOTE — Progress Notes (Signed)
[facility] PROGRESS NOTE  Assessment/Plan: A-fib with RVR: - Plan electrical cardioversion today. - On eluquis. - recent 2-D echo 02/08/2014 with EF of 25-30% with diffuse hypokinesis,and as such will not repeat  Acute on chronic systolic CHF (congestive heart failure) - Place on Lasix 40 mg IV every 12 hours. Continue home dose of Coreg, lisinopril, aspirin, eliquis, Lipitor.  - Good UOP. - Monitor electrolytes and replete. - Cardiac markers negative.  Hypertension  Stable. Continue Coreg and lisinopril  COPD with acute bronchitis/s/p post tracheostomy: - started on IV abx, cont inhalers antibiotics and steroids. Chest x-ray would by basilar infiltrates, Patient is afebrile - Trach care per respiratory therapy. Patient was stating history of these to be downsized.  Diabetes mellitus  - Hemoglobin A1c was 7.9 on 02/07/2014. Hold oral hypoglycemic agents. - cont SSI.  Hyperlipidemia: Stable. Continue Lipitor.  Falls  - Unclear etiology.  - PT/OT rec Home health PT   Code Status: Full  DVT Prophylaxis: On ELIQUIS  Family Communication: Updated patient on daughter-in-law at bedside.  Disposition Plan: ADMIT TO TELEMETRY    Consultants:  cardiology  Procedures:  DCCV  Antibiotics:  levaquin  HPI/Subjective: Feels good  Objective: Filed Vitals:   03/18/14 0142 03/18/14 0504 03/18/14 0720 03/18/14 0907  BP: 112/59 115/72    Pulse: 112 112  115  Temp: 97.6 F (36.4 C) 97.5 F (36.4 C)    TempSrc: Oral Oral    Resp: 20 20    Height:      Weight:  62.6 kg (138 lb 0.1 oz)    SpO2: 100% 100% 100% 99%    Intake/Output Summary (Last 24 hours) at 03/18/14 1032 Last data filed at 03/18/14 0509  Gross per 24 hour  Intake    758 ml  Output   3100 ml  Net  -2342 ml   Filed Weights   03/17/14 1500 03/18/14 0504  Weight: 64.683 kg (142 lb 9.6 oz) 62.6 kg (138 lb 0.1 oz)    Exam:  General: Alert, awake, oriented x3, in no acute distress.    HEENT: No bruits, no goiter.  Heart: irregular rate and rhythm. Lungs: Good air movement, clear Abdomen: Soft, nontender, nondistended, positive bowel sounds.    Data Reviewed: Basic Metabolic Panel:  Recent Labs Lab 03/17/14 1030 03/17/14 1655 03/18/14 0320  NA 140  --  136*  K 4.4  --  4.6  CL 101  --  95*  CO2 25  --  27  GLUCOSE 165*  --  307*  BUN 18  --  23  CREATININE 0.85  --  1.06  CALCIUM 8.2*  --  8.4  MG  --  1.1*  --    Liver Function Tests:  Recent Labs Lab 03/17/14 1030  AST 24  ALT 44  ALKPHOS 72  BILITOT 0.3  PROT 6.5  ALBUMIN 3.2*   No results found for this basename: LIPASE, AMYLASE,  in the last 168 hours No results found for this basename: AMMONIA,  in the last 168 hours CBC:  Recent Labs Lab 03/17/14 1030  WBC 5.0  HGB 11.1*  HCT 33.2*  MCV 95.4  PLT 241   Cardiac Enzymes:  Recent Labs Lab 03/17/14 1030 03/17/14 1655 03/17/14 2129 03/18/14 0320  TROPONINI <0.30 <0.30 <0.30 <0.30   BNP (last 3 results)  Recent Labs  02/12/14 0728 03/17/14 1030 03/18/14 0320  PROBNP 9440.0* 5030.0* 7135.0*   CBG:  Recent Labs Lab 03/17/14 1901 03/17/14 2144 03/18/14 0744  GLUCAP 358* 127* 213*    Recent Results (from the past 240 hour(s))  CULTURE, RESPIRATORY (NON-EXPECTORATED)     Status: None   Collection Time    03/17/14  2:24 PM      Result Value Ref Range Status   Specimen Description SPUTUM   Final   Special Requests NONE   Final   Gram Stain     Final   Value: NO WBC SEEN     RARE SQUAMOUS EPITHELIAL CELLS PRESENT     RARE GRAM POSITIVE COCCI     IN PAIRS IN CLUSTERS     Performed at [facility]   Culture PENDING   Incomplete   Report Status PENDING   Incomplete     Studies: Ct Head Wo Contrast  03/17/2014   CLINICAL DATA:  Weakness, fall.  EXAM: CT HEAD WITHOUT CONTRAST  TECHNIQUE: Contiguous axial images were obtained from the base of the skull through the vertex without intravenous contrast.   COMPARISON:  CT scan of February 26, 2006.  FINDINGS: Complete opacification of the left frontal and maxillary sinus is noted as well significant opacification of the left ethmoid sinuses. Prominent mucosal thickening of the left nasal turbinates is noted. Bony calvarium is otherwise intact. Mild diffuse cortical atrophy is noted. Mild chronic ischemic white matter disease is noted. Old left periventricular white matter infarction is noted. No mass effect or midline shift is noted. Ventricular size is within normal limits. There is no evidence of mass lesion, hemorrhage or acute infarction.  IMPRESSION: Mild diffuse cortical atrophy. Mild chronic ischemic white matter disease. Old left periventricular white matter infarction. No occur acute intracranial abnormality seen.  There is complete opacification of the left frontal and maxillary sinuses, as well as significant opacification of the left ethmoid sinuses. This is concerning for left frontal and maxillary mucoceles most likely due to severe mucosal hypertrophy and inflammation of the left nasal turbinates.   Electronically Signed   By: Sabino Dick M.D.   On: 03/17/2014 14:15   Dg Chest Portable 1 View  03/17/2014   CLINICAL DATA:  Chest pain and congestion.  EXAM: PORTABLE CHEST - 1 VIEW  COMPARISON:  02/12/2014.  FINDINGS: 1048 hrs. There is persistent bibasilar interstitial airspace opacity with probable small left pleural effusion. Cardiopericardial silhouette is at upper limits of normal for size. Imaged bony structures of the thorax are intact. Telemetry leads overlie the chest.  IMPRESSION: Stable. Bibasilar interstitial and alveolar opacities may be related to infection although dependent edema could have this appearance.   Electronically Signed   By: Misty Stanley M.D.   On: 03/17/2014 11:13    Scheduled Meds: . amiodarone  150 mg Intravenous Once  . amiodarone  400 mg Oral BID  . apixaban  5 mg Oral BID  . aspirin EC  81 mg Oral Once per day  on Mon Thu  . atorvastatin  10 mg Oral q1800  . carvedilol  6.25 mg Oral BID WC  . citalopram  10 mg Oral Daily  . ferrous sulfate  325 mg Oral Q breakfast  . finasteride  5 mg Oral Daily  . furosemide  40 mg Oral Daily  . guaiFENesin  1,200 mg Oral BID  . insulin aspart  0-9 Units Subcutaneous TID WC  . ipratropium  0.5 mg Nebulization BID  . levalbuterol  0.63 mg Nebulization BID  . levofloxacin (LEVAQUIN) IV  500 mg Intravenous Q24H  . levothyroxine  75 mcg Oral QAC breakfast  .  lisinopril  5 mg Oral Daily  . pantoprazole  40 mg Oral Daily  . potassium chloride SA  20 mEq Oral Daily  . predniSONE  60 mg Oral QAC breakfast  . sodium chloride  3 mL Intravenous Q12H   Continuous Infusions:    Charlynne Cousins  [facility] Pager [phone]. If 8PM-8AM, please contact night-coverage at [email or web address], password [facility] 03/18/2014, 10:32 AM  LOS: 1 day

## 2014-03-18 NOTE — Progress Notes (Signed)
Patient evaluated for community based chronic disease management services with [place] [facility] as a benefit of patient's [employer]. Spoke with patient, son, and daughter in law [name]) at bedside to explain [place] [facility] services.  Services have been accepted with written consent.  Patient would like to complete his HCPOA while admitted.  Also needs pharmacy assistance to reduce his out of pocket cost for Eliquist.  Wife recently admitted to [facility].  He has been married to her for 3 years and is grieving the change and needs disease process education.  Daughter in law has requested RN home visits to learn more about CAD/AFIB management.  Patient may be moving in with her for support but this is too be determined.  Patient will receive a post discharge transition of care call and will be evaluated for monthly home visits for assessments and disease process education.  Left contact information and [facility] literature at bedside. Made Inpatient Case Manager aware that [place] [facility] following. Of note, [facility] Care Management services does not replace or interfere with any services that are arranged by inpatient case management or social work.  For additional questions or referrals please contact Corliss Blacker BSN RN [facility] Liaison at [phone].

## 2014-03-18 NOTE — Evaluation (Signed)
Occupational Therapy Evaluation Patient Details Name: [name] MRN: [id number] DOB: 03-30-1923 Today's Date: 03/18/2014    History of Present Illness 80 y.o. male with a past medical history significant for CAD, s/p CABG in '96, PCI in '07. He has ICM with an EF of 25-30% and mild to moderate AS Aug 2015. He presented 02/07/14 with AF and was started on Eliquis with plans for an OP cardioversion after 4 weeks. He was discharged 02/09/14 and re admitted 02/13/14 with CHF (BNP 9000). He was given Lasix and sent home the next day 02/14/14. He lives alone. He has not been to the office as an OP yet. Today he was visiting his wife in the nursing home when he fell in the bathroom. This is his third fall in the last two months. He came to the ER and his BNP was 5000. He had some markings on his CXR that could have been from CHF. He was not SOB. He was treated with a diuretic in the ER as well as breathing treatments and is seen now in consult. He is currently comfortable in bed.    Clinical Impression   Pt was living independently prior to admission and driving.  Pt presents with impaired memory and safety requiring supervision and use of RW for transfers,ambulation and ADL. Pt will have 24 hour care of his family upon discharge.  Recommend HHOT for a safety eval with IADL at home.  Will defer further OT to [facility].    Follow Up Recommendations  Home health OT;Supervision/Assistance - 24 hour (safety eval)    Equipment Recommendations  None recommended by OT    Recommendations for Other Services       Precautions / Restrictions Precautions Precautions: Fall Precaution Comments: old tracheostomy Restrictions Weight Bearing Restrictions: No      Mobility Bed Mobility Overal bed mobility: Needs Assistance Bed Mobility: Supine to Sit     Supine to sit: Supervision     General bed mobility comments: S for safety with min use of bed rail, but HOB flat to simulate home  Transfers Overall  transfer level: Needs assistance Equipment used: Rolling walker (2 wheeled) Transfers: Sit to/from Stand Sit to Stand: Supervision         General transfer comment: verbal cues for hand placement    Balance Overall balance assessment: Needs assistance Sitting-balance support: Feet supported Sitting balance-Leahy Scale: Good     Standing balance support: During functional activity;Bilateral upper extremity supported Standing balance-Leahy Scale: Fair                              ADL Overall ADL's : Needs assistance/impaired Eating/Feeding: Independent;Sitting (currently NPO)   Grooming: Wash/dry hands;Standing;Supervision/safety   Upper Body Bathing: Supervision/ safety;Standing   Lower Body Bathing: Supervison/ safety;Sit to/from stand   Upper Body Dressing : Supervision/safety;Standing   Lower Body Dressing: Supervision/safety;Sit to/from stand   Toilet Transfer: Supervision/safety;Regular Toilet;RW   Toileting- Water quality scientist and Hygiene: Supervision/safety;Sit to/from stand       Functional mobility during ADLs: Supervision/safety;Rolling walker;Cueing for sequencing       Vision                     Perception     Praxis      Pertinent Vitals/Pain Pain Assessment: No/denies pain     Hand Dominance Right   Extremity/Trunk Assessment Upper Extremity Assessment Upper Extremity Assessment: Overall WFL for tasks assessed  Lower Extremity Assessment Lower Extremity Assessment: Defer to PT evaluation   Cervical / Trunk Assessment Cervical / Trunk Assessment: Kyphotic   Communication Communication Communication: HOH   Cognition Arousal/Alertness: Awake/alert Behavior During Therapy: WFL for tasks assessed/performed Overall Cognitive Status: History of cognitive impairments - at baseline Area of Impairment: Safety/judgement;Memory     Memory: Decreased recall of precautions;Decreased short-term memory    Safety/Judgement: Decreased awareness of safety;Decreased awareness of deficits     General Comments: pt repeatedly asking to eat (NPO for procedure), ambulating without walker not recalling PTs education this morning.   General Comments       Exercises       Shoulder Instructions      Home Living Family/patient expects to be discharged to:: Private residence Living Arrangements: Alone Available Help at Discharge: Family;Available 24 hours/day Type of Home: House Home Access: Stairs to enter [employer] of Steps: 3-4 at sons house, ramp to enter pts home Entrance Stairs-Rails: Right;Left Home Layout: One level     Bathroom Shower/Tub: Tub/shower unit;Walk-in shower Shower/tub characteristics: Architectural technologist: Standard Bathroom Accessibility: Yes How Accessible: Accessible via walker Home Equipment: Cane - single point;Walker - 4 wheels   Additional Comments: pt prepares own breakfast, goes to daughter's for supper      Prior Functioning/Environment Level of Independence: Independent        Comments: still drives, hx of tracheostomy    OT Diagnosis: Generalized weakness;Cognitive deficits   OT Problem List: Impaired balance (sitting and/or standing);Decreased cognition;Decreased safety awareness;Decreased knowledge of use of DME or AE   OT Treatment/Interventions:      OT Goals(Current goals can be found in the care plan section) Acute Rehab OT Goals Patient Stated Goal: eat  OT Frequency:     Barriers to D/C:            Co-evaluation              End of Session Equipment Utilized During Treatment: Rolling walker  Activity Tolerance: Patient tolerated treatment well Patient left: in bed;with call bell/phone within reach;with family/visitor present   Time: 1142-1200 OT Time Calculation (min): 18 min Charges:  OT General Charges $OT Visit: 1 Procedure OT Evaluation $Initial OT Evaluation Tier I: 1 Procedure OT  Treatments $Self Care/Home Management : 8-22 mins G-Codes:    Malka So 03/18/2014, 12:29 PM [phone]

## 2014-03-18 NOTE — Care Management Note (Addendum)
Page 2 of 3   03/19/2014     2:44:15 PM CARE MANAGEMENT NOTE 03/19/2014  Patient:  [name], [name]   Account Number:  [id number]  Date Initiated:  03/18/2014  Documentation initiated by:  [name]  Subjective/Objective Assessment:   Patient was admitted with CHF, a-fib.  Lives at home alone.     Action/Plan:   Will follow for discharge needs   Anticipated DC Date:  03/19/2014   Anticipated DC Plan:  HOME/SELF CARE  In-house referral  [place]  CM consult  Other      [place]   Choice offered to / List presented to:  [name]   DME arranged  [name]      DME agency  [place] arranged  HH-1 RN  [place] [facility]      [place].   Status of service:  Completed, signed off Medicare Important Message given?  NA - LOS <3 / Initial given by admissions (If response is "NO", the following Medicare IM given date fields will be blank) Date Medicare IM given:   Medicare IM given by:   Date Additional Medicare IM given:   Additional Medicare IM given by:    Discharge Disposition:  [place]  Per UR Regulation:  Reviewed for med. necessity/level of care/duration of stay  If discussed at [place] of Stay Meetings, dates discussed:    Comments:  [name] Abate RN, BSN, MSHL, CCM  Nurse - Case Manager,  (Unit [place])  [phone]  03/18/2014 DME RECS:  RW ([facility]/[name] notifed) PT RECS:  PT Dispo Plan: Home with HHS:  RN, PT - Disease, Medication MGMT and Fall Prevention  [facility] / [name]  notified) Patient will d/c to Son and DTRinlaws home: [name] [address], [place]  [address] Phone:  [phone]  Medication Eliquis Patient attempted to fill Rx prior to admission and cost was $104 and patient did not fill. Patient is not able to recall if 30 day free card has been  used. CM provided Son / [name] who will assist with application for medication asssitance.  CM provided instrucftion on completion.  Son to take application to prescribing MD office to finalized MD section and MD office to fax to Med asssitance program. Son has purchased Rx that patient needs  Benefits Check: apixaban (ELIQUIS) tablet 5 mg  :  Dose 5 mg  :  Oral  :  2 times daily Please contact provider to determine why patients med cost $104 on last attempted refill.  Is patient in Donut hole? Thanks, Diplomatic Services operational officer, BSN, MSHL, CCM 03/19/2014 Update:  S/W [name] @ [facility]  [phone] APIXABAN  : NOT ON FORMULARY ELIQUIS  5 MG COVER-YES CO-PAY- 30 % TIER- 1 DRUG PRIOR APPROVAL -NO PHARMACY : [facility], [facility] NO [place]  HCPOA Son states he can get HCPOA completed post d/c and not necessary to delay d/c d/t waiting on Chaplain services. Son has Programmer, systems and has reviewed documents. Son and Patient to continue completion post hospital d/c.  Other IDT Resources:  [facility]  Ayala Ribble RN, BSN, MSHL, CCM  Nurse - Case Manager,  (Unit [place])  [phone]  03/18/2014 POC:  HCPOA IDT / [facility] updates CM that HCPOA document completion requested by patient and DTRinLaw [name].  HCPOA Package placed in patients room who is currently having cardioversion.  Charge Nurse and Nurse [name] notified to instruct patient to review overnight and [employer] will f/u tomorrow for questions and completion.  POC:  Medication assistance with Eliquis CM f/u pending.

## 2014-03-18 NOTE — Progress Notes (Signed)
INITIAL NUTRITION ASSESSMENT  DOCUMENTATION CODES Per approved criteria  -Not Applicable   INTERVENTION: Diet advancement per MD Recommend Glucerna Shakes BID when diet adv  NUTRITION DIAGNOSIS: Predicted sub optimal energy intake related to weight loss PTA and inability to eat as evidenced by 6% weight loss in the past 5 weeks and current NPO status.   Goal: Pt to meet >/= 90% of their estimated nutrition needs   Monitor:  Diet advancement, PO intake, weight trend, labs, I/O's  Reason for Assessment: Malnutrition Screening Tool (MST), score of 2  80 y.o. male  Admitting Dx: A-fib  ASSESSMENT: 80 y.o. male with a past medical history significant for COPD, DM, CHF, and CAD s/p CABG. Today he was visiting his wife in the nursing home when he fell in the bathroom. This is his third fall in the last two months. He came to the ER and his BNP was 5000. He had some markings on his CXR that could have been from CHF.   Pt out of room at time of visit and currently NPO. Per MST filed in chart pt reported recent unintentional weight loss of 14 to 23 lb; however he denied eating poorly due to a decreased appetite. Weight history below shows pt has lost 9 lbs in the past 5 weeks and 14 lbs in the past 9 months. RD will monitor diet advancement and PO adequacy.  Labs reviewed. Elevated glucose, decreased GFR, low magnesium, low calcium  Height: Ht Readings from Last 1 Encounters:  03/17/14 5\' 3"  (1.6 m)    Weight: Wt Readings from Last 1 Encounters:  03/18/14 138 lb 0.1 oz (62.6 kg)    Ideal Body Weight: 124 lbs  % Ideal Body Weight: 111%  Wt Readings from Last 10 Encounters:  03/18/14 138 lb 0.1 oz (62.6 kg)  03/18/14 138 lb 0.1 oz (62.6 kg)  02/14/14 134 lb (60.782 kg)  02/09/14 147 lb 1.6 oz (66.724 kg)  08/20/13 149 lb (67.586 kg)  07/07/13 152 lb 8 oz (69.174 kg)  06/23/13 153 lb 6 oz (69.57 kg)  05/17/13 152 lb 9.6 oz (69.219 kg)  11/07/12 152 lb 12.8 oz (69.31 kg)   11/07/12 152 lb 12.8 oz (69.31 kg)    Usual Body Weight: 152 lbs  % Usual Body Weight: 91%  BMI:  Body mass index is 24.45 kg/(m^2).  Estimated Nutritional Needs: Kcal: 4403-4742 Protein: 85-95 grams Fluid: 1.5-1.7 L/day  Skin: intact  Diet Order: NPO  EDUCATION NEEDS: -No education needs identified at this time   Intake/Output Summary (Last 24 hours) at 03/18/14 1705 Last data filed at 03/18/14 1455  Gross per 24 hour  Intake    518 ml  Output   2600 ml  Net  -2082 ml    Last BM: 9/30   Labs:   Recent Labs Lab 03/17/14 1030 03/17/14 1655 03/18/14 0320  NA 140  --  136*  K 4.4  --  4.6  CL 101  --  95*  CO2 25  --  27  BUN 18  --  23  CREATININE 0.85  --  1.06  CALCIUM 8.2*  --  8.4  MG  --  1.1*  --   GLUCOSE 165*  --  307*    CBG (last 3)   Recent Labs  03/17/14 2144 03/18/14 0744 03/18/14 1209  GLUCAP 127* 213* 202*    Scheduled Meds: . amiodarone  400 mg Oral BID  . apixaban  5 mg Oral BID  .  aspirin EC  81 mg Oral Once per day on Mon Thu  . atorvastatin  10 mg Oral q1800  . carvedilol  6.25 mg Oral BID WC  . citalopram  10 mg Oral Daily  . ferrous sulfate  325 mg Oral Q breakfast  . finasteride  5 mg Oral Daily  . furosemide  40 mg Oral Daily  . guaiFENesin  1,200 mg Oral BID  . insulin aspart  0-9 Units Subcutaneous TID WC  . ipratropium  0.5 mg Nebulization BID  . levalbuterol  0.63 mg Nebulization BID  . levofloxacin (LEVAQUIN) IV  500 mg Intravenous Q24H  . levothyroxine  75 mcg Oral QAC breakfast  . lisinopril  5 mg Oral Daily  . pantoprazole  40 mg Oral Daily  . potassium chloride SA  20 mEq Oral Daily  . predniSONE  60 mg Oral QAC breakfast    Continuous Infusions:   Past Medical History  Diagnosis Date  . Coronary artery disease     s/p CABG 1996 then PCI of SVG to OM 2007  . Diverticulosis   . Dyslipidemia   . Renal artery stenosis     s/p bilateral stents  . Hiatal hernia   . Colon polyps   . Carotid  artery stenosis     50% BILATERAL 2014  . Peripheral neuropathy   . Anemia   . Hypothyroidism   . Nasal polyps   . Hypertension   . Diabetes mellitus   . Throat cancer S/P TRACHECTOMY   . Myocardial infarction   . Peripheral vascular disease     LOWER EXT  . Atrial fibrillation/flutter Aug 2015  . GERD (gastroesophageal reflux disease)   . Shortness of breath     Hx: of  . HOH (hard of hearing) VERY   . Left bundle branch block (LBBB) on electrocardiogram 03/17/2014  . Ischemic cardiomyopathy Aug 2015    EF 25-30%   . History of throat cancer     s/p trach  . Aortic stenosis Aug 2015    mild to moderate  . Chronic anticoagulation Aug 2015    Eliquis  . CHF (congestive heart failure)     Past Surgical History  Procedure Laterality Date  . Coronary artery bypass graft      1996  . Cardiac catheterization      PCI of SVG to OM 2007  . Renal artery stent    . Tracheostomy    . Appendectomy    . Tonsillectomy    . Sp pta peripheral  March 2013    Lt SFA (Dr Saundra Shelling)  . Sp pta peripheral  May 2013    Rt SFA (Dr Bridgett Larsson)    Pryor Ochoa RD, LDN Inpatient Clinical Dietitian Pager: [phone] After Hours Pager: [phone]

## 2014-03-18 NOTE — Progress Notes (Signed)
RT Note:  RT educated patient on use of flutter valve.  Patient demonstrated good effort. Patient encouraged to use every 4 hours while awake.

## 2014-03-18 NOTE — Evaluation (Signed)
Physical Therapy Evaluation Patient Details Name: [name] MRN: [id number] DOB: 1922/08/10 Today's Date: 03/18/2014   History of Present Illness  80 y.o. male with a past medical history significant for CAD, s/p CABG in '96, PCI in '07. He has ICM with an EF of 25-30% and mild to moderate AS Aug 2015. He presented 02/07/14 with AF and was started on Eliquis with plans for an OP cardioversion after 4 weeks. He was discharged 02/09/14 and re admitted 02/13/14 with CHF (BNP 9000). He was given Lasix and sent home the next day 02/14/14. He lives alone. He has not been to the office as an OP yet. Today he was visiting his wife in the nursing home when he fell in the bathroom. This is his third fall in the last two months. He came to the ER and his BNP was 5000. He had some markings on his CXR that could have been from CHF. He was not SOB. He was treated with a diuretic in the ER as well as breathing treatments and is seen now in consult. He is currently comfortable in bed.   Clinical Impression  Pt tolerated OOB and gait in hallway with close S and use of RW.  No overt LOB noted during gait, cues for safety and proper use of RW.  Also performed stairs at min/guard level.  Feel he will benefit from continued acute services to address deficits and feel he will need 24/7 S upon D.C home.  He states he can stay with his son and daughter in law, as she does not work and can provide S.      Follow Up Recommendations Home health PT;Supervision/Assistance - 24 hour    Equipment Recommendations  Rolling walker with 5" wheels    Recommendations for Other Services       Precautions / Restrictions Precautions Precautions: Fall Precaution Comments: old tracheostomy Restrictions Weight Bearing Restrictions: No      Mobility  Bed Mobility Overal bed mobility: Needs Assistance Bed Mobility: Supine to Sit     Supine to sit: Supervision     General bed mobility comments: S for safety with min use of  bed rail, but HOB flat to simulate home  Transfers Overall transfer level: Needs assistance Equipment used: Rolling walker (2 wheeled) Transfers: Sit to/from Stand Sit to Stand: Min assist         General transfer comment: min A for forward weight shift with mod/max cues for hand placement and safety when turning with RW  Ambulation/Gait Ambulation/Gait assistance: Min guard;Supervision Ambulation Distance (Feet): 150 Feet Assistive device: Rolling walker (2 wheeled) Gait Pattern/deviations: Step-through pattern;Decreased stride length;Narrow base of support;Trunk flexed Gait velocity: decreased   General Gait Details: Pt steady with use of RW, no overt LOB, however does require cues for upright posture, remaining inside of RW with turns and maintaining position inside of RW.   Stairs Stairs: Yes Stairs assistance: Min guard (min/guard w/ 2 rails, min A with single rail) Stair Management: Two rails;One rail Right;Alternating pattern;Forwards Number of Stairs: 11 General stair comments: Pt requires min/guard steadying assist when ascending using two rails, however requires min A for safety with use of single rail.  Cues for safety and to decrease speed as he was somewhat impulsive on stairs.   Wheelchair Mobility    Modified Rankin (Stroke Patients Only)       Balance Overall balance assessment: Needs assistance Sitting-balance support: Feet supported Sitting balance-Leahy Scale: Good     Standing balance support:  During functional activity;Bilateral upper extremity supported Standing balance-Leahy Scale: Fair                               Pertinent Vitals/Pain Pain Assessment: No/denies pain    Home Living Family/patient expects to be discharged to:: Private residence Living Arrangements: Alone Available Help at Discharge: Family;Available 24 hours/day (states he could live with family when he leaves) Type of Home: House Home Access: Stairs to  enter Entrance Stairs-Rails: Psychiatric nurse of Steps: 3-4 at sons house, ramp to enter pts home Home Layout: One level Home Equipment: Cane - single point;Walker - 4 wheels      Prior Function Level of Independence: Independent         Comments: still drives, hx of tracheostomy     Hand Dominance   Dominant Hand: Right    Extremity/Trunk Assessment               Lower Extremity Assessment: Generalized weakness      Cervical / Trunk Assessment: Kyphotic  Communication   Communication: HOH  Cognition Arousal/Alertness: Awake/alert Behavior During Therapy: WFL for tasks assessed/performed Overall Cognitive Status: Within Functional Limits for tasks assessed       Memory: Decreased recall of precautions;Decreased short-term memory              General Comments      Exercises        Assessment/Plan    PT Assessment Patient needs continued PT services  PT Diagnosis Difficulty walking;Generalized weakness   PT Problem List Decreased strength;Decreased activity tolerance;Decreased balance;Decreased mobility;Decreased knowledge of use of DME;Decreased safety awareness;Decreased knowledge of precautions  PT Treatment Interventions DME instruction;Gait training;Stair training;Functional mobility training;Therapeutic activities;Therapeutic exercise;Balance training;Patient/family education   PT Goals (Current goals can be found in the Care Plan section) Acute Rehab PT Goals Patient Stated Goal: to return to son's house PT Goal Formulation: With patient Time For Goal Achievement: 03/25/14 Potential to Achieve Goals: Good    Frequency Min 3X/week   Barriers to discharge        Co-evaluation               End of Session   Activity Tolerance: Patient tolerated treatment well Patient left: in chair;with call bell/phone within reach Nurse Communication: Mobility status (notified RN student about O2 sats, she is to tell RN)          Time: 2725-3664 PT Time Calculation (min): 27 min   Charges:   PT Evaluation $Initial PT Evaluation Tier I: 1 Procedure PT Treatments $Gait Training: 23-37 mins   PT G CodesDenice Bors 03/18/2014, 9:14 AM

## 2014-03-18 NOTE — H&P (Signed)
HPI:  This is a 80 y.o. male with a past medical history significant for CAD, s/p CABG in '96, PCI in '07. He has ICM with an EF of 25-30% and mild to moderate AS Aug 2015. He presented 02/07/14 with AF and was started on Eliquis with plans for an OP cardioversion after 4 weeks. He was discharged 02/09/14 and re admitted 02/13/14 with CHF (BNP 9000). He was given Lasix and sent home the next day 02/14/14. He lives alone. He has not been to the office as an OP yet. Today he was visiting his wife in the nursing home when he fell in the bathroom. This is his third fall in the last two months. He came to the ER and his BNP was 5000. He had some markings on his CXR that could have been from CHF. He was not SOB. He was treated with a diuretic in the ER as well as breathing treatments and is seen now in consult. He is currently comfortable in bed.  PMHx:  Past Medical History   Diagnosis  Date   .  Coronary artery disease      s/p CABG 1996 then PCI of SVG to OM 2007   .  Diverticulosis    .  Dyslipidemia    .  Renal artery stenosis      s/p bilateral stents   .  Hiatal hernia    .  Colon polyps    .  Carotid artery stenosis      50% BILATERAL 2014   .  Peripheral neuropathy    .  Anemia    .  Hypothyroidism    .  Nasal polyps    .  Hypertension    .  Diabetes mellitus    .  Throat cancer S/P TRACHECTOMY    .  Myocardial infarction    .  Peripheral vascular disease      LOWER EXT   .  Atrial fibrillation/flutter  Aug 2015   .  GERD (gastroesophageal reflux disease)    .  Shortness of breath      Hx: of   .  HOH (hard of hearing) VERY    .  Left bundle branch block (LBBB) on electrocardiogram  03/17/2014   .  Ischemic cardiomyopathy  Aug 2015     EF 25-30%   .  History of throat cancer      s/p trach   .  Aortic stenosis  Aug 2015     mild to moderate   .  Chronic anticoagulation  Aug 2015     Eliquis    Past Surgical History   Procedure  Laterality  Date   .  Coronary artery bypass  graft       1996   .  Cardiac catheterization       PCI of SVG to OM 2007   .  Renal artery stent     .  Tracheostomy     .  Appendectomy     .  Tonsillectomy     .  Sp pta peripheral   March 2013     Lt SFA (Dr Saundra Shelling)   .  Sp pta peripheral   May 2013     Rt SFA (Dr Bridgett Larsson)    SOCHx:  reports that he quit smoking about 67 years ago. His smoking use included Cigarettes. He smoked 0.30 packs per day. He does not have any smokeless tobacco history on file. He reports that he  does not drink alcohol or use illicit drugs.  FAMHx:  Family History   Problem  Relation  Age of Onset   .  Hypertension  Mother    .  Heart attack  Brother    .  Diabetes  Son     ALLERGIES:  No Known Allergies  ROS:  Pertinent items are noted in HPI.  See H&P for complete details. He denies angina, his falls seem to be mechanical. His daughter says his gait is "shuffling" and unsteady.  HOME MEDICATIONS:  Prior to Admission medications   Medication  Sig  Start Date  End Date  Taking?  Authorizing Provider   albuterol (PROVENTIL HFA;VENTOLIN HFA) 108 (90 BASE) MCG/ACT inhaler  Inhale 2-3 puffs into the lungs at bedtime.    Yes  Historical Provider, MD   apixaban (ELIQUIS) 5 MG TABS tablet  Take 2.5 mg by mouth 2 (two) times daily.    Yes  Historical Provider, MD   aspirin EC 81 MG tablet  Take 81 mg by mouth 2 (two) times a week. Mondays and thursdays    Yes  Historical Provider, MD   atorvastatin (LIPITOR) 10 MG tablet  Take 10 mg by mouth daily.    Yes  Historical Provider, MD   carvedilol (COREG) 6.25 MG tablet  Take 6.25 mg by mouth 2 (two) times daily with a meal.    Yes  Historical Provider, MD   citalopram (CELEXA) 10 MG tablet  Take 10 mg by mouth daily.    Yes  Historical Provider, MD   ferrous sulfate 325 (65 FE) MG tablet  Take 325 mg by mouth daily with breakfast.    Yes  Historical Provider, MD   finasteride (PROSCAR) 5 MG tablet  Take 5 mg by mouth daily.    Yes  Historical Provider, MD   fluticasone  (FLONASE) 50 MCG/ACT nasal spray  Place 2 sprays into both nostrils daily as needed for rhinitis or allergies.    Yes  Historical Provider, MD   furosemide (LASIX) 20 MG tablet  Take 1 tablet (20 mg total) by mouth daily.  02/15/14   Yes  Domenic Polite, MD   glipiZIDE (GLUCOTROL) 10 MG tablet  Take 20 mg by mouth 2 (two) times daily before a meal.    Yes  Historical Provider, MD   levothyroxine (SYNTHROID) 75 MCG tablet  Take 1 tablet (75 mcg total) by mouth daily before breakfast.  02/09/14   Yes  Donne Hazel, MD   lisinopril (PRINIVIL,ZESTRIL) 5 MG tablet  Take 1 tablet (5 mg total) by mouth daily.  02/15/14   Yes  Domenic Polite, MD   metFORMIN (GLUCOPHAGE) 500 MG tablet  Take 2 tablets (1,000 mg total) by mouth 2 (two) times daily with a meal.  11/09/12   Yes  [name], MD   potassium chloride SA (K-DUR,KLOR-CON) 20 MEQ tablet  Take 20 mEq by mouth 2 (two) times daily.    Yes  Historical Provider, MD   terbinafine (LAMISIL) 1 % cream  Apply topically 2 (two) times daily.  02/09/14   Yes  Donne Hazel, MD   HOSPITAL MEDICATIONS:  I have reviewed the patient's current medications.  VITALS:  Blood pressure 112/66, pulse 97, temperature 98.2 F (36.8 C), temperature source Oral, resp. rate 24, SpO2 100.00%.  PHYSICAL EXAM:  General appearance: alert, cooperative and no distress  Neck: no carotid bruit, no JVD and s/p trach  Lungs: few scattered rhonchi  Heart:  irregularly irregular rhythm  Abdomen: soft, non-tender; bowel sounds normal; no masses, no organomegaly  Extremities: extremities normal, atraumatic, no cyanosis or edema  Pulses: 2+ and symmetric  Skin: Skin color, texture, turgor normal. No rashes or lesions  Neurologic: Grossly normal  LABS:  Results for orders placed during the hospital encounter of 03/17/14 (from the past 24 hour(s))   COMPREHENSIVE METABOLIC PANEL Status: Abnormal    Collection Time    03/17/14 10:30 AM   Result  Value  Ref Range    Sodium  140   137 - 147 mEq/L    Potassium  4.4  3.7 - 5.3 mEq/L    Chloride  101  96 - 112 mEq/L    CO2  25  19 - 32 mEq/L    Glucose, Bld  165 (*)  70 - 99 mg/dL    BUN  18  6 - 23 mg/dL    Creatinine, Ser  0.85  0.50 - 1.35 mg/dL    Calcium  8.2 (*)  8.4 - 10.5 mg/dL    Total Protein  6.5  6.0 - 8.3 g/dL    Albumin  3.2 (*)  3.5 - 5.2 g/dL    AST  24  0 - 37 U/L    ALT  44  0 - 53 U/L    Alkaline Phosphatase  72  39 - 117 U/L    Total Bilirubin  0.3  0.3 - 1.2 mg/dL    GFR calc non Af Amer  75 (*)  >90 mL/min    GFR calc Af Amer  87 (*)  >90 mL/min    Anion gap  14  5 - 15   TROPONIN I Status: None    Collection Time    03/17/14 10:30 AM   Result  Value  Ref Range    Troponin I  <0.30  <0.30 ng/mL   CBC Status: Abnormal    Collection Time    03/17/14 10:30 AM   Result  Value  Ref Range    WBC  5.0  4.0 - 10.5 K/uL    RBC  3.48 (*)  4.22 - 5.81 MIL/uL    Hemoglobin  11.1 (*)  13.0 - 17.0 g/dL    HCT  33.2 (*)  39.0 - 52.0 %    MCV  95.4  78.0 - 100.0 fL    MCH  31.9  26.0 - 34.0 pg    MCHC  33.4  30.0 - 36.0 g/dL    RDW  13.8  11.5 - 15.5 %    Platelets  241  150 - 400 K/uL   PRO B NATRIURETIC PEPTIDE Status: Abnormal    Collection Time    03/17/14 10:30 AM   Result  Value  Ref Range    Pro B Natriuretic peptide (BNP)  5030.0 (*)  0 - 450 pg/mL    EKG: AF with LBBB  IMAGING:  Ct Head Wo Contrast  03/17/2014 IMPRESSION: Mild diffuse cortical atrophy. Mild chronic ischemic white matter disease. Old left periventricular white matter infarction. No occur acute intracranial abnormality seen. There is complete opacification of the left frontal and maxillary sinuses, as well as significant opacification of the left ethmoid sinuses. This is concerning for left frontal and maxillary mucoceles most likely due to severe mucosal hypertrophy and inflammation of the left nasal turbinates. Electronically Signed By: Sabino Dick M.D. On: 03/17/2014 14:15  Dg Chest Portable 1 View  03/17/2014 CLINICAL  DATA: Chest pain and congestion. EXAM:  PORTABLE CHEST - IMPRESSION: Stable. Bibasilar interstitial and alveolar opacities may be related to infection although dependent edema could have this appearance. Electronically Signed By: Misty Stanley M.D. On: 03/17/2014 11:13  IMPRESSION:  Principal Problem:  Acute on chronic systolic CHF (congestive heart failure)  Active Problems:  A-fib  Fall S/p tracheostomy Rec: I have discussed the indications for DCCV with the patient and he wishes to proceed.  Mikle Bosworth.D.

## 2014-03-18 NOTE — Progress Notes (Addendum)
Patient Name: [name] Date of Encounter: 03/18/2014    SUBJECTIVE: The patient has very little insight about why he is in the hospital. He fell while visiting his wife in the nursing home yesterday. For one month he has been on anticoagulation in preparation for electrical cardioversion. He's had multiple recurrent acute bouts of CHF over the past month since the atrial fib was identified.  TELEMETRY:  Atrial fib with poor ventricular rate control Filed Vitals:   03/18/14 0142 03/18/14 0504 03/18/14 0720 03/18/14 0907  BP: 112/59 115/72    Pulse: 112 112  115  Temp: 97.6 F (36.4 C) 97.5 F (36.4 C)    TempSrc: Oral Oral    Resp: 20 20    Height:      Weight:  138 lb 0.1 oz (62.6 kg)    SpO2: 100% 100% 100% 99%    Intake/Output Summary (Last 24 hours) at 03/18/14 0921 Last data filed at 03/18/14 0509  Gross per 24 hour  Intake    758 ml  Output   3100 ml  Net  -2342 ml   LABS: Basic Metabolic Panel:  Recent Labs  03/17/14 1030 03/17/14 1655 03/18/14 0320  NA 140  --  136*  K 4.4  --  4.6  CL 101  --  95*  CO2 25  --  27  GLUCOSE 165*  --  307*  BUN 18  --  23  CREATININE 0.85  --  1.06  CALCIUM 8.2*  --  8.4  MG  --  1.1*  --    CBC:  Recent Labs  03/17/14 1030  WBC 5.0  HGB 11.1*  HCT 33.2*  MCV 95.4  PLT 241   Cardiac Enzymes:  Recent Labs  03/17/14 1655 03/17/14 2129 03/18/14 0320  TROPONINI <0.30 <0.30 <0.30   BNP    Component Value Date/Time   PROBNP 7135.0* 03/18/2014 0320     Radiology/Studies:  Probable CHF on chest x-ray  Physical Exam: Blood pressure 115/72, pulse 115, temperature 97.5 F (36.4 C), temperature source Oral, resp. rate 20, height '5\' 3"'  (1.6 m), weight 138 lb 0.1 oz (62.6 kg), SpO2 99.00%. Weight change:   Wt Readings from Last 3 Encounters:  03/18/14 138 lb 0.1 oz (62.6 kg)  02/14/14 134 lb (60.782 kg)  02/09/14 147 lb 1.6 oz (66.724 kg)    He has a tracheostomy He is sitting without  significant dyspnea Basal rales are heard Rapid irregular rate is noted. Consistent with atrial fibrillation There is no peripheral edema per  ASSESSMENT: 1. Persistent atrial fibrillation/flutter with poor rate control contributing to recurring episodes of acute on chronic systolic heart failure 2. Anticoagulation with Eliquis for at least 4 weeks now. 3. Acute on chronic systolic heart failure with a least 3 hospital or emergency room encounters over the past 4 weeks.   Plan:  1. Plan electrical cardioversion today as outlined in Dr. Jacalyn Lefevre note yesterday. 2. Possible discharge from the hospital 24 hours later his medication adjustments are adequately tolerated. 3. I will start amiodarone to give the patient an opportunity to maintain sinus rhythm given the sensitivity of heart failure 2 the presence of atrial fibrillation. Amiodarone may not be required long-term but should be continued for at least several weeks. I just met him today but generally at his age with the multiple recurrent presentations, I would use amiodarone to suppress recurrences of atrial fibrillation over long haul. 4. Hold carvedilol today and resume  tomorrow.    Demetrios Isaacs 03/18/2014, 9:21 AM

## 2014-03-18 NOTE — Progress Notes (Signed)
UR complete.  Milton Streicher RN, MSN 

## 2014-03-18 NOTE — Progress Notes (Signed)
CPT via flutter not done this round, pt. out of room.

## 2014-03-19 DIAGNOSIS — Z7901 Long term (current) use of anticoagulants: Secondary | ICD-10-CM

## 2014-03-19 DIAGNOSIS — I481 Persistent atrial fibrillation: Secondary | ICD-10-CM

## 2014-03-19 DIAGNOSIS — E039 Hypothyroidism, unspecified: Secondary | ICD-10-CM

## 2014-03-19 DIAGNOSIS — I25810 Atherosclerosis of coronary artery bypass graft(s) without angina pectoris: Secondary | ICD-10-CM

## 2014-03-19 LAB — GLUCOSE, CAPILLARY
GLUCOSE-CAPILLARY: 377 mg/dL — AB (ref 70–99)
Glucose-Capillary: 202 mg/dL — ABNORMAL HIGH (ref 70–99)

## 2014-03-19 LAB — BASIC METABOLIC PANEL
Anion gap: 14 (ref 5–15)
BUN: 27 mg/dL — ABNORMAL HIGH (ref 6–23)
CO2: 27 mEq/L (ref 19–32)
Calcium: 8.4 mg/dL (ref 8.4–10.5)
Chloride: 96 mEq/L (ref 96–112)
Creatinine, Ser: 1.19 mg/dL (ref 0.50–1.35)
GFR calc Af Amer: 61 mL/min — ABNORMAL LOW (ref >90)
GFR calc non Af Amer: 52 mL/min — ABNORMAL LOW (ref >90)
Glucose, Bld: 195 mg/dL — ABNORMAL HIGH (ref 70–99)
Potassium: 4 mEq/L (ref 3.7–5.3)
Sodium: 137 mEq/L (ref 137–147)

## 2014-03-19 MED ORDER — PREDNISONE 10 MG PO TABS: ORAL_TABLET | ORAL | Status: DC

## 2014-03-19 MED ORDER — LEVOFLOXACIN 500 MG PO TABS: 500.0000 mg | ORAL_TABLET | Freq: Every day | ORAL | Status: DC

## 2014-03-19 MED ORDER — AMIODARONE HCL 400 MG PO TABS: ORAL_TABLET | ORAL | Status: DC

## 2014-03-19 NOTE — Plan of Care (Signed)
Problem: Phase I Progression Outcomes Goal: Hemodynamically stable Outcome: Progressing Patient remains A&O x1 this shift.  He is cooperative with staff but forgetful.  He requires frequent reorientation.  VSS.  No ectopy observed.  Will continue to monitor patient condition.

## 2014-03-19 NOTE — Progress Notes (Signed)
Patient was asleep and did not want to be woke up to perform flutter valve.

## 2014-03-19 NOTE — Progress Notes (Signed)
Patient is doing better. He is holding sinus rhythm. Will need a decrease in amiodarone intensity after a couple weeks. He seems euvolemic and I agree that he can be discharged today.

## 2014-03-19 NOTE — Progress Notes (Signed)
Transitional Care Clinic:  This Case Manager reviewed patient's EMR and determined patient may benefit from chronic care management services through the [facility]. This Case Manager met with patient and patient's son to discuss the services and medical management that can be provided at the [facility].  Patient's son declined post-discharge follow-up with the [facility] and indicates patient already has needed post-discharge follow-up in place.

## 2014-03-19 NOTE — Progress Notes (Signed)
Physical Therapy Treatment Patient Details Name: [name] MRN: [id number] DOB: [birth year] Today's Date: 03/19/2014    History of Present Illness 80 y.o. male with a past medical history significant for CAD, s/p CABG in '96, PCI in '07. He has ICM with an EF of 25-30% and mild to moderate AS Aug 2015. He was admitted with CHF exacerbation after fall in bathroom at the nursing home visiting his wife.  This is third admission since August.    PT Comments    Patient progressing with mobility with walker in room and LE strength in standing.  Needs close supervision for safety in home environment with walker and reminders for safe transfers.  Son in room and confirmed he will be staying with him and his wife.  Follow Up Recommendations  Home health PT;Supervision/Assistance - 24 hour     Equipment Recommendations  Rolling walker with 5" wheels    Recommendations for Other Services       Precautions / Restrictions Precautions Precautions: Fall Precaution Comments: old tracheostomy; HOH    Mobility  Bed Mobility Overal bed mobility: Modified Independent                Transfers Overall transfer level: Needs assistance Equipment used: Rolling walker (2 wheeled) Transfers: Sit to/from Stand Sit to Stand: Supervision         General transfer comment: verbal cues for hand placement and demonstration   Ambulation/Gait Ambulation/Gait assistance: Supervision;Min guard Ambulation Distance (Feet): 200 Feet Assistive device: Rolling walker (2 wheeled) Gait Pattern/deviations: Step-to pattern;Step-through pattern;Shuffle;Decreased stride length Gait velocity: decreased   General Gait Details: donned shoes for ambulation in hallway, was more stable with increased step height than with slipper socks in room to bathroom; difficulty maneuvering walker in small space of bathroom, needed assist   Stairs            Wheelchair Mobility    Modified Rankin (Stroke  Patients Only)       Balance Overall balance assessment: Needs assistance           Standing balance-Leahy Scale: Poor Standing balance comment: UE assist for balance up on feet, reports h/o caudication pain in feet/legs with poor circulation and insensate feet, neuropathic pain                    Cognition Arousal/Alertness: Awake/alert Behavior During Therapy: WFL for tasks assessed/performed Overall Cognitive Status: History of cognitive impairments - at baseline Area of Impairment: Memory;Safety/judgement;Problem solving         Safety/Judgement: Decreased awareness of safety;Decreased awareness of deficits   Problem Solving: Difficulty sequencing;Requires verbal cues General Comments: trouble remembering where to put hands for transfers despite three reminders    Exercises General Exercises - Lower Extremity Hip ABduction/ADduction: AROM;Both;10 reps;Standing Heel Raises: AROM;Both;10 reps;Standing Mini-Sqauts: Both;AROM;10 reps;Standing Other Exercises Other Exercises: hip extension 10 reps both legs standing    General Comments        Pertinent Vitals/Pain Pain Assessment: No/denies pain    Home Living                      Prior Function            PT Goals (current goals can now be found in the care plan section) Progress towards PT goals: Progressing toward goals    Frequency  Min 3X/week    PT Plan Current plan remains appropriate    Co-evaluation  End of Session Equipment Utilized During Treatment: Gait belt Activity Tolerance: Patient tolerated treatment well Patient left: in bed;with call bell/phone within reach;with bed alarm set;with family/visitor present     Time: 1050-1117 PT Time Calculation (min): 27 min  Charges:  $Gait Training: 8-22 mins $Therapeutic Exercise: 8-22 mins                    G Codes:      Kiondra Caicedo,CYNDI 2014-04-16, 11:57 AM Magda Kiel, [place] 04-16-14

## 2014-03-19 NOTE — Discharge Summary (Addendum)
Physician Discharge Summary  [name] NIO:[id number] DOB: Jun 16, 1923 DOA: 03/17/2014  PCP: Mathews Argyle, MD  Admit date: 03/17/2014 Discharge date: 03/19/2014  Time spent: 35 minutes  Recommendations for Outpatient Follow-up:  1. Follow up with PCP in 4 weeks. 2. Follow up with cardiology in 2 weeks. 3. Repeat TSH in 6 weeks.  Discharge Diagnoses:  Principal Problem:   A-fib Active Problems:   Acute on chronic systolic CHF (congestive heart failure)   Hypertension   Hypothyroidism   Peripheral vascular disease   Mixed hyperlipidemia   DM (diabetes mellitus)   COPD with acute bronchitis   Falls   Left bundle branch block (LBBB) on electrocardiogram   Cardiomyopathy- EF 25-30%   Chronic anticoagulation   Aortic stenosis   History of throat cancer   Discharge Condition: Stable  Diet recommendation: heart healthy  Filed Weights   03/17/14 1500 03/18/14 0504 03/19/14 0503  Weight: 64.683 kg (142 lb 9.6 oz) 62.6 kg (138 lb 0.1 oz) 61.85 kg (136 lb 5.7 oz)    History of present illness:  80 y.o. male With history of A. fib on anticoagulation, coronary artery disease status post CABG in 1996 to PCI of the SVG to the OM in 2007, cardiomyopathy with EF of 25-30%presents to the ED from home with a fall. Patient recently hospitalized from 02/12/2014 to 02/14/2053 acute on chronic systolic CHF exacerbation.  Patient states when to use the bathroom on the morning of admission and subsequently fell down and laid back down. Daughter-in-law stated that when she got to the house he was in the living room trying to put his pants on which was unable to do with generalized weakness. Patient endorses compliance with his medications. Patient endorses a heart healthy diet. Patient denies any fevers, no chills, no nausea, no vomiting, no shortness of breath, no weight gain, no lower extremity edema, no syncope, no melena, no hematemesis, no hematochezia. Patient denies any orthopnea, no  paroxysmal nocturnal dyspnea.  Patient does endorse a productive cough whitish sputum ongoing for the past several weeks.   Hospital Course:  A-fib with RVR:  - Cardiac markers negative x3. - Consulted cardiology as pt was symptomatic DCCV was recommended and done on 10.1.2015 - recent 2-D echo 02/08/2014 with EF of 25-30% with diffuse hypokinesis,and as such will not repeat  - On eluquis. Started amiodarone, then cardioverted on 10.1.2015. - will cont amiodarone and follow up with PCP.  Acute on chronic systolic CHF (congestive heart failure): - Place on Lasix 40 mg IV every 12 hours. Continue home dose of Coreg, lisinopril, aspirin, eliquis, Lipitor.  - Good UOP. Transition to oral dose of lasix once Euvolemic. - Monitor electrolytes and replete.  - Cardiac markers negative.   Hypertension: - Stable. Continue Coreg and lisinopril.  COPD with acute bronchitis/s/p post tracheostomy:  - Started on IV abx, cont inhalers antibiotics and steroids. Chest x-ray would by basilar infiltrates, Patient is afebrile  - Trach care per respiratory therapy.  - transition to oral antibiotics and steroids tapered for home.  Diabetes mellitus  - Hemoglobin A1c was 7.9 on 02/07/2014. Held oral hypoglycemic agents.  - resume as an outpatient. - cont SSI.   Hyperlipidemia:  Stable. Continue Lipitor.   Falls  - Unclear etiology.  - PT/OT rec Home health PT     Procedures:  DCCV cardioversion  Consultations:  cardiology  Discharge Exam: Filed Vitals:   03/19/14 0503  BP: 131/66  Pulse: 71  Temp: 97.4 F (36.3 C)  Resp:  16    General: A&O x3 Cardiovascular: RRR Respiratory: good air movement CTA B/L  Discharge Instructions You were cared for by a hospitalist during your hospital stay. If you have any questions about your discharge medications or the care you received while you were in the hospital after you are discharged, you can call the unit and asked to speak with the  hospitalist on call if the hospitalist that took care of you is not available. Once you are discharged, your primary care physician will handle any further medical issues. Please note that NO REFILLS for any discharge medications will be authorized once you are discharged, as it is imperative that you return to your primary care physician (or establish a relationship with a primary care physician if you do not have one) for your aftercare needs so that they can reassess your need for medications and monitor your lab values.  Discharge Instructions   Diet - low sodium heart healthy    Complete by:  As directed      Increase activity slowly    Complete by:  As directed           Current Discharge Medication List    START taking these medications   Details  amiodarone (PACERONE) 400 MG tablet Take 400 mg BID for 5 days then 200 mg daily. Qty: 30 tablet, Refills: 0    levofloxacin (LEVAQUIN) 500 MG tablet Take 1 tablet (500 mg total) by mouth daily. Qty: 4 tablet, Refills: 0    predniSONE (DELTASONE) 10 MG tablet Takes 6 tablets for 1 days, then 5 tablets for 1 days, then 4 tablets for 1 days, then 3 tablets for 1 days, then 2 tabs for 1 days, then 1 tab for 1 days, and then stop. Qty: 21 tablet, Refills: 0      CONTINUE these medications which have NOT CHANGED   Details  albuterol (PROVENTIL HFA;VENTOLIN HFA) 108 (90 BASE) MCG/ACT inhaler Inhale 2-3 puffs into the lungs at bedtime.     apixaban (ELIQUIS) 5 MG TABS tablet Take 2.5 mg by mouth 2 (two) times daily.    aspirin EC 81 MG tablet Take 81 mg by mouth 2 (two) times a week. Mondays and thursdays    atorvastatin (LIPITOR) 10 MG tablet Take 10 mg by mouth daily.    carvedilol (COREG) 6.25 MG tablet Take 6.25 mg by mouth 2 (two) times daily with a meal.    citalopram (CELEXA) 10 MG tablet Take 10 mg by mouth daily.    ferrous sulfate 325 (65 FE) MG tablet Take 325 mg by mouth daily with breakfast.    finasteride (PROSCAR) 5 MG  tablet Take 5 mg by mouth daily.     fluticasone (FLONASE) 50 MCG/ACT nasal spray Place 2 sprays into both nostrils daily as needed for rhinitis or allergies.     furosemide (LASIX) 20 MG tablet Take 1 tablet (20 mg total) by mouth daily. Qty: 30 tablet, Refills: 0    glipiZIDE (GLUCOTROL) 10 MG tablet Take 20 mg by mouth 2 (two) times daily before a meal.     levothyroxine (SYNTHROID) 75 MCG tablet Take 1 tablet (75 mcg total) by mouth daily before breakfast. Qty: 30 tablet, Refills: 0    lisinopril (PRINIVIL,ZESTRIL) 5 MG tablet Take 1 tablet (5 mg total) by mouth daily. Qty: 30 tablet, Refills: 0    metFORMIN (GLUCOPHAGE) 500 MG tablet Take 2 tablets (1,000 mg total) by mouth 2 (two) times daily with a meal.  potassium chloride SA (K-DUR,KLOR-CON) 20 MEQ tablet Take 20 mEq by mouth 2 (two) times daily.    terbinafine (LAMISIL) 1 % cream Apply topically 2 (two) times daily. Qty: 30 g, Refills: 0       No Known Allergies Follow-up Information   Follow up with [name]., MD In 2 weeks. (hospital follow up)    Specialty:  Interventional Cardiology   Contact information:   [address] [place] [address] [phone]        The results of significant diagnostics from this hospitalization (including imaging, microbiology, ancillary and laboratory) are listed below for reference.    Significant Diagnostic Studies: Ct Head Wo Contrast  03/17/2014   CLINICAL DATA:  Weakness, fall.  EXAM: CT HEAD WITHOUT CONTRAST  TECHNIQUE: Contiguous axial images were obtained from the base of the skull through the vertex without intravenous contrast.  COMPARISON:  CT scan of February 26, 2006.  FINDINGS: Complete opacification of the left frontal and maxillary sinus is noted as well significant opacification of the left ethmoid sinuses. Prominent mucosal thickening of the left nasal turbinates is noted. Bony calvarium is otherwise intact. Mild diffuse cortical  atrophy is noted. Mild chronic ischemic white matter disease is noted. Old left periventricular white matter infarction is noted. No mass effect or midline shift is noted. Ventricular size is within normal limits. There is no evidence of mass lesion, hemorrhage or acute infarction.  IMPRESSION: Mild diffuse cortical atrophy. Mild chronic ischemic white matter disease. Old left periventricular white matter infarction. No occur acute intracranial abnormality seen.  There is complete opacification of the left frontal and maxillary sinuses, as well as significant opacification of the left ethmoid sinuses. This is concerning for left frontal and maxillary mucoceles most likely due to severe mucosal hypertrophy and inflammation of the left nasal turbinates.   Electronically Signed   By: Sabino Dick M.D.   On: 03/17/2014 14:15   Dg Chest Portable 1 View  03/17/2014   CLINICAL DATA:  Chest pain and congestion.  EXAM: PORTABLE CHEST - 1 VIEW  COMPARISON:  02/12/2014.  FINDINGS: 1048 hrs. There is persistent bibasilar interstitial airspace opacity with probable small left pleural effusion. Cardiopericardial silhouette is at upper limits of normal for size. Imaged bony structures of the thorax are intact. Telemetry leads overlie the chest.  IMPRESSION: Stable. Bibasilar interstitial and alveolar opacities may be related to infection although dependent edema could have this appearance.   Electronically Signed   By: Misty Stanley M.D.   On: 03/17/2014 11:13    Microbiology: Recent Results (from the past 240 hour(s))  CULTURE, RESPIRATORY (NON-EXPECTORATED)     Status: None   Collection Time    03/17/14  2:24 PM      Result Value Ref Range Status   Specimen Description SPUTUM   Final   Special Requests NONE   Final   Gram Stain     Final   Value: NO WBC SEEN     RARE SQUAMOUS EPITHELIAL CELLS PRESENT     RARE GRAM POSITIVE COCCI     IN PAIRS IN CLUSTERS     Performed at [facility]   Culture      Final   Value: MODERATE STAPHYLOCOCCUS AUREUS     Note: RIFAMPIN AND GENTAMICIN SHOULD NOT BE USED AS SINGLE DRUGS FOR TREATMENT OF STAPH INFECTIONS.     Performed at [facility]   Report Status PENDING   Incomplete  Labs: Basic Metabolic Panel:  Recent Labs Lab 03/17/14 1030 03/17/14 1655 03/18/14 0320 03/19/14 0402  NA 140  --  136* 137  K 4.4  --  4.6 4.0  CL 101  --  95* 96  CO2 25  --  27 27  GLUCOSE 165*  --  307* 195*  BUN 18  --  23 27*  CREATININE 0.85  --  1.06 1.19  CALCIUM 8.2*  --  8.4 8.4  MG  --  1.1*  --   --    Liver Function Tests:  Recent Labs Lab 03/17/14 1030  AST 24  ALT 44  ALKPHOS 72  BILITOT 0.3  PROT 6.5  ALBUMIN 3.2*   No results found for this basename: LIPASE, AMYLASE,  in the last 168 hours No results found for this basename: AMMONIA,  in the last 168 hours CBC:  Recent Labs Lab 03/17/14 1030  WBC 5.0  HGB 11.1*  HCT 33.2*  MCV 95.4  PLT 241   Cardiac Enzymes:  Recent Labs Lab 03/17/14 1030 03/17/14 1655 03/17/14 2129 03/18/14 0320  TROPONINI <0.30 <0.30 <0.30 <0.30   BNP: BNP (last 3 results)  Recent Labs  02/12/14 0728 03/17/14 1030 03/18/14 0320  PROBNP 9440.0* 5030.0* 7135.0*   CBG:  Recent Labs Lab 03/18/14 0744 03/18/14 1209 03/18/14 1548 03/18/14 2124 03/19/14 0531  GLUCAP 213* 202* 286* 241* 202*       Signed:  Charlynne Cousins  [facility] 03/19/2014, 8:39 AM

## 2014-03-19 NOTE — Progress Notes (Signed)
Patient Name: [name] Date of Encounter: 03/19/2014     Principal Problem:   A-fib Active Problems:   Hypertension   Hypothyroidism   Peripheral vascular disease   Mixed hyperlipidemia   DM (diabetes mellitus)   Acute on chronic systolic CHF (congestive heart failure)   COPD with acute bronchitis   Falls   Left bundle branch block (LBBB) on electrocardiogram   Cardiomyopathy- EF 25-30%   Chronic anticoagulation   Aortic stenosis   History of throat cancer    SUBJECTIVE  The patient has very little insight about why he is in the hospital. He fell while visiting his wife in the nursing home yesterday. For one month he has been on anticoagulation in preparation for electrical cardioversion. He's had multiple recurrent acute bouts of CHF over the past month since the atrial fib was identified.    Patient mildly confused. Lives alone. Wife in nursing home. Will need home health.   CURRENT MEDS . amiodarone  400 mg Oral BID  . apixaban  5 mg Oral BID  . aspirin EC  81 mg Oral Once per day on Mon Thu  . atorvastatin  10 mg Oral q1800  . carvedilol  6.25 mg Oral BID WC  . citalopram  10 mg Oral Daily  . ferrous sulfate  325 mg Oral Q breakfast  . finasteride  5 mg Oral Daily  . furosemide  40 mg Oral Daily  . guaiFENesin  1,200 mg Oral BID  . insulin aspart  0-9 Units Subcutaneous TID WC  . ipratropium  0.5 mg Nebulization BID  . levalbuterol  0.63 mg Nebulization BID  . levofloxacin (LEVAQUIN) IV  500 mg Intravenous Q24H  . levothyroxine  75 mcg Oral QAC breakfast  . lisinopril  5 mg Oral Daily  . pantoprazole  40 mg Oral Daily  . potassium chloride SA  20 mEq Oral Daily  . predniSONE  60 mg Oral QAC breakfast  . sodium chloride  3 mL Intravenous Q12H    OBJECTIVE  Filed Vitals:   03/18/14 2020 03/18/14 2122 03/19/14 0503 03/19/14 0818  BP:  100/51 131/66   Pulse:  64 71   Temp:  97.8 F (36.6 C) 97.4 F (36.3 C)   TempSrc:  Oral Oral   Resp:  18 16     Height:      Weight:   136 lb 5.7 oz (61.85 kg)   SpO2: 100% 100% 99% 97%    Intake/Output Summary (Last 24 hours) at 03/19/14 0844 Last data filed at 03/19/14 0815  Gross per 24 hour  Intake    363 ml  Output    650 ml  Net   -287 ml   Filed Weights   03/17/14 1500 03/18/14 0504 03/19/14 0503  Weight: 142 lb 9.6 oz (64.683 kg) 138 lb 0.1 oz (62.6 kg) 136 lb 5.7 oz (61.85 kg)    PHYSICAL EXAM  General: Pleasant, NAD. Mildly confused. Trach in placed. Thin and elderly appearing Neuro: Alert and oriented X 3. Moves all extremities spontaneously. Psych: Normal affect. HEENT:  Normal  Neck: Supple without bruits or JVD. Lungs:  Resp regular and unlabored, CTA. Heart: RRR no s3, s4, or murmurs. Abdomen: Soft, non-tender, non-distended, BS + x 4.  Extremities: No clubbing, cyanosis or edema. DP/PT/Radials 2+ and equal bilaterally.  Accessory Clinical Findings  CBC  Recent Labs  03/17/14 1030  WBC 5.0  HGB 11.1*  HCT 33.2*  MCV 95.4  PLT 241  Basic Metabolic Panel  Recent Labs  03/17/14 1655 03/18/14 0320 03/19/14 0402  NA  --  136* 137  K  --  4.6 4.0  CL  --  95* 96  CO2  --  27 27  GLUCOSE  --  307* 195*  BUN  --  23 27*  CREATININE  --  1.06 1.19  CALCIUM  --  8.4 8.4  MG 1.1*  --   --    Liver Function Tests  Recent Labs  03/17/14 1030  AST 24  ALT 44  ALKPHOS 72  BILITOT 0.3  PROT 6.5  ALBUMIN 3.2*    Cardiac Enzymes  Recent Labs  03/17/14 1655 03/17/14 2129 03/18/14 0320  TROPONINI <0.30 <0.30 <0.30   Thyroid Function Tests  Recent Labs  03/17/14 1655  TSH 9.120*    TELE  NSR   Radiology/Studies  Ct Head Wo Contrast  03/17/2014   CLINICAL DATA:  Weakness, fall.  EXAM: CT HEAD WITHOUT CONTRAST  TECHNIQUE: Contiguous axial images were obtained from the base of the skull through the vertex without intravenous contrast.  COMPARISON:  CT scan of February 26, 2006.  FINDINGS: Complete opacification of the left frontal and  maxillary sinus is noted as well significant opacification of the left ethmoid sinuses. Prominent mucosal thickening of the left nasal turbinates is noted. Bony calvarium is otherwise intact. Mild diffuse cortical atrophy is noted. Mild chronic ischemic white matter disease is noted. Old left periventricular white matter infarction is noted. No mass effect or midline shift is noted. Ventricular size is within normal limits. There is no evidence of mass lesion, hemorrhage or acute infarction.  IMPRESSION: Mild diffuse cortical atrophy. Mild chronic ischemic white matter disease. Old left periventricular white matter infarction. No occur acute intracranial abnormality seen.  There is complete opacification of the left frontal and maxillary sinuses, as well as significant opacification of the left ethmoid sinuses. This is concerning for left frontal and maxillary mucoceles most likely due to severe mucosal hypertrophy and inflammation of the left nasal turbinates.   Electronically Signed   By: Sabino Dick M.D.   On: 03/17/2014 14:15   Dg Chest Portable 1 View  03/17/2014   CLINICAL DATA:  Chest pain and congestion.  EXAM: PORTABLE CHEST - 1 VIEW  COMPARISON:  02/12/2014.  FINDINGS: 1048 hrs. There is persistent bibasilar interstitial airspace opacity with probable small left pleural effusion. Cardiopericardial silhouette is at upper limits of normal for size. Imaged bony structures of the thorax are intact. Telemetry leads overlie the chest.  IMPRESSION: Stable. Bibasilar interstitial and alveolar opacities may be related to infection although dependent edema could have this appearance.   Electronically Signed   By: Misty Stanley M.D.   On: 03/17/2014 11:13    ASSESSMENT AND PLAN  Persistent atrial fibrillation/flutter - s/p successful DCCV yesterday.  -- Has been on anticoagulation with Eliquis for at least 4 weeks now.  -- Started on amiodarone 400mg  BID on 03/17/14 to give the patient an opportunity to  maintain sinus rhythm given the sensitivity of heart failure to the presence of atrial fibrillation. Amiodarone may not be required long-term but should be continued for at least several weeks. Will be given 5 more days of 400mg  BID and then reduced to 200 mg qd until follow up in clinic. -- Carvedilol held yesterday and resumed today. Continue Eliquis.   Acute on chronic systolic heart failure with a least 3 hospital or emergency room encounters over the past 4  weeks.  - Placed on Lasix 40 mg IV every 12 hours. Continue home dose of Coreg, lisinopril, aspirin, eliquis, Lipitor.  - Good UOP. Transitioned to oral dose of lasix once Euvolemic.  - Cardiac markers negative.   Hypertension:  - Stable. Continue Coreg and lisinopril.   COPD with acute bronchitis/s/p post tracheostomy:  - Started on IV abx, cont inhalers antibiotics and steroids. Chest x-ray would by basilar infiltrates, Patient is afebrile  - Trach care per respiratory therapy.  - transition to oral antibiotics and steroids tapered for home.   Diabetes mellitus  - Hemoglobin A1c was 7.9 on 02/07/2014. Held oral hypoglycemic agents.  - resume as an outpatient.  - cont SSI.    Dispo-  Patient will need home health. He is followed by Dr. Irish Lack.    Judy Pimple PA-C  Pager [phone]

## 2014-03-20 LAB — CULTURE, RESPIRATORY: GRAM STAIN: NONE SEEN

## 2014-03-20 LAB — CULTURE, RESPIRATORY W GRAM STAIN

## 2014-03-26 ENCOUNTER — Encounter (HOSPITAL_COMMUNITY): Payer: Self-pay | Admitting: Emergency Medicine

## 2014-03-26 ENCOUNTER — Inpatient Hospital Stay (HOSPITAL_COMMUNITY)
Admission: EM | Admit: 2014-03-26 | Discharge: 2014-03-29 | DRG: 444 | Disposition: A | Discharge status: Home Health | Payer: Medicare Other | Attending: Internal Medicine | Admitting: Internal Medicine

## 2014-03-26 ENCOUNTER — Emergency Department (HOSPITAL_COMMUNITY): Payer: Medicare Other

## 2014-03-26 DIAGNOSIS — K859 Acute pancreatitis without necrosis or infection, unspecified: Secondary | ICD-10-CM | POA: Diagnosis present

## 2014-03-26 DIAGNOSIS — I4891 Unspecified atrial fibrillation: Secondary | ICD-10-CM | POA: Diagnosis present

## 2014-03-26 DIAGNOSIS — E119 Type 2 diabetes mellitus without complications: Secondary | ICD-10-CM | POA: Diagnosis present

## 2014-03-26 DIAGNOSIS — Z9861 Coronary angioplasty status: Secondary | ICD-10-CM | POA: Diagnosis not present

## 2014-03-26 DIAGNOSIS — H9190 Unspecified hearing loss, unspecified ear: Secondary | ICD-10-CM | POA: Diagnosis present

## 2014-03-26 DIAGNOSIS — Z85819 Personal history of malignant neoplasm of unspecified site of lip, oral cavity, and pharynx: Secondary | ICD-10-CM | POA: Diagnosis not present

## 2014-03-26 DIAGNOSIS — E872 Acidosis, unspecified: Secondary | ICD-10-CM

## 2014-03-26 DIAGNOSIS — I350 Nonrheumatic aortic (valve) stenosis: Secondary | ICD-10-CM | POA: Diagnosis present

## 2014-03-26 DIAGNOSIS — I429 Cardiomyopathy, unspecified: Secondary | ICD-10-CM

## 2014-03-26 DIAGNOSIS — E785 Hyperlipidemia, unspecified: Secondary | ICD-10-CM | POA: Diagnosis present

## 2014-03-26 DIAGNOSIS — K219 Gastro-esophageal reflux disease without esophagitis: Secondary | ICD-10-CM | POA: Diagnosis present

## 2014-03-26 DIAGNOSIS — E039 Hypothyroidism, unspecified: Secondary | ICD-10-CM | POA: Diagnosis present

## 2014-03-26 DIAGNOSIS — I10 Essential (primary) hypertension: Secondary | ICD-10-CM | POA: Diagnosis present

## 2014-03-26 DIAGNOSIS — I4819 Other persistent atrial fibrillation: Secondary | ICD-10-CM

## 2014-03-26 DIAGNOSIS — Z79899 Other long term (current) drug therapy: Secondary | ICD-10-CM | POA: Diagnosis not present

## 2014-03-26 DIAGNOSIS — I25810 Atherosclerosis of coronary artery bypass graft(s) without angina pectoris: Secondary | ICD-10-CM | POA: Diagnosis present

## 2014-03-26 DIAGNOSIS — E860 Dehydration: Secondary | ICD-10-CM

## 2014-03-26 DIAGNOSIS — I509 Heart failure, unspecified: Secondary | ICD-10-CM | POA: Diagnosis present

## 2014-03-26 DIAGNOSIS — Z7901 Long term (current) use of anticoagulants: Secondary | ICD-10-CM | POA: Diagnosis not present

## 2014-03-26 DIAGNOSIS — I252 Old myocardial infarction: Secondary | ICD-10-CM | POA: Diagnosis not present

## 2014-03-26 DIAGNOSIS — I5020 Unspecified systolic (congestive) heart failure: Secondary | ICD-10-CM

## 2014-03-26 DIAGNOSIS — G629 Polyneuropathy, unspecified: Secondary | ICD-10-CM | POA: Diagnosis present

## 2014-03-26 DIAGNOSIS — Z87891 Personal history of nicotine dependence: Secondary | ICD-10-CM | POA: Diagnosis not present

## 2014-03-26 DIAGNOSIS — R5381 Other malaise: Secondary | ICD-10-CM | POA: Diagnosis present

## 2014-03-26 DIAGNOSIS — I739 Peripheral vascular disease, unspecified: Secondary | ICD-10-CM | POA: Diagnosis present

## 2014-03-26 DIAGNOSIS — Z7982 Long term (current) use of aspirin: Secondary | ICD-10-CM

## 2014-03-26 DIAGNOSIS — E46 Unspecified protein-calorie malnutrition: Secondary | ICD-10-CM | POA: Diagnosis present

## 2014-03-26 DIAGNOSIS — K5900 Constipation, unspecified: Secondary | ICD-10-CM | POA: Diagnosis present

## 2014-03-26 DIAGNOSIS — Z930 Tracheostomy status: Secondary | ICD-10-CM | POA: Diagnosis not present

## 2014-03-26 DIAGNOSIS — K8020 Calculus of gallbladder without cholecystitis without obstruction: Secondary | ICD-10-CM | POA: Diagnosis present

## 2014-03-26 DIAGNOSIS — Z85818 Personal history of malignant neoplasm of other sites of lip, oral cavity, and pharynx: Secondary | ICD-10-CM

## 2014-03-26 DIAGNOSIS — R1013 Epigastric pain: Secondary | ICD-10-CM | POA: Diagnosis present

## 2014-03-26 LAB — URINALYSIS, ROUTINE W REFLEX MICROSCOPIC
Bilirubin Urine: NEGATIVE
GLUCOSE, UA: NEGATIVE mg/dL
HGB URINE DIPSTICK: NEGATIVE
Ketones, ur: NEGATIVE mg/dL
LEUKOCYTES UA: NEGATIVE
Nitrite: NEGATIVE
PH: 5 (ref 5.0–8.0)
Protein, ur: NEGATIVE mg/dL
SPECIFIC GRAVITY, URINE: 1.016 (ref 1.005–1.030)
Urobilinogen, UA: 0.2 mg/dL (ref 0.0–1.0)

## 2014-03-26 LAB — CBC WITH DIFFERENTIAL/PLATELET
Basophils Absolute: 0 10*3/uL (ref 0.0–0.1)
Basophils Relative: 0 % (ref 0–1)
EOS ABS: 0 10*3/uL (ref 0.0–0.7)
EOS PCT: 0 % (ref 0–5)
HCT: 37.2 % — ABNORMAL LOW (ref 39.0–52.0)
Hemoglobin: 12.9 g/dL — ABNORMAL LOW (ref 13.0–17.0)
LYMPHS ABS: 0.9 10*3/uL (ref 0.7–4.0)
Lymphocytes Relative: 9 % — ABNORMAL LOW (ref 12–46)
MCH: 31.5 pg (ref 26.0–34.0)
MCHC: 34.7 g/dL (ref 30.0–36.0)
MCV: 91 fL (ref 78.0–100.0)
Monocytes Absolute: 0.8 10*3/uL (ref 0.1–1.0)
Monocytes Relative: 8 % (ref 3–12)
NEUTROS PCT: 83 % — AB (ref 43–77)
Neutro Abs: 7.9 10*3/uL — ABNORMAL HIGH (ref 1.7–7.7)
PLATELETS: 333 10*3/uL (ref 150–400)
RBC: 4.09 MIL/uL — AB (ref 4.22–5.81)
RDW: 13.5 % (ref 11.5–15.5)
WBC: 9.5 10*3/uL (ref 4.0–10.5)

## 2014-03-26 LAB — COMPREHENSIVE METABOLIC PANEL
ALBUMIN: 3.4 g/dL — AB (ref 3.5–5.2)
ALK PHOS: 62 U/L (ref 39–117)
ALT: 14 U/L (ref 0–53)
AST: 14 U/L (ref 0–37)
Anion gap: 20 — ABNORMAL HIGH (ref 5–15)
BUN: 37 mg/dL — ABNORMAL HIGH (ref 6–23)
CO2: 20 mEq/L (ref 19–32)
Calcium: 8.6 mg/dL (ref 8.4–10.5)
Chloride: 99 mEq/L (ref 96–112)
Creatinine, Ser: 1.18 mg/dL (ref 0.50–1.35)
GFR calc non Af Amer: 53 mL/min — ABNORMAL LOW (ref >90)
GFR, EST AFRICAN AMERICAN: 61 mL/min — AB (ref >90)
GLUCOSE: 52 mg/dL — AB (ref 70–99)
POTASSIUM: 4.1 meq/L (ref 3.7–5.3)
SODIUM: 139 meq/L (ref 137–147)
Total Bilirubin: 0.7 mg/dL (ref 0.3–1.2)
Total Protein: 6.2 g/dL (ref 6.0–8.3)

## 2014-03-26 LAB — I-STAT CG4 LACTIC ACID, ED: Lactic Acid, Venous: 6 mmol/L — ABNORMAL HIGH (ref 0.5–2.2)

## 2014-03-26 LAB — POC OCCULT BLOOD, ED: Fecal Occult Bld: NEGATIVE

## 2014-03-26 LAB — LIPASE, BLOOD: Lipase: 225 U/L — ABNORMAL HIGH (ref 11–59)

## 2014-03-26 MED ORDER — IOHEXOL 300 MG/ML  SOLN
80.0000 mL | Freq: Once | INTRAMUSCULAR | Status: AC | PRN
  Administered 2014-03-26: 80 mL via INTRAVENOUS

## 2014-03-26 MED ORDER — IOHEXOL 300 MG/ML  SOLN
25.0000 mL | INTRAMUSCULAR | Status: AC
  Administered 2014-03-26: 25 mL via ORAL

## 2014-03-26 MED ORDER — SODIUM CHLORIDE 0.9 % IV BOLUS (SEPSIS)
1000.0000 mL | Freq: Once | INTRAVENOUS | Status: AC
  Administered 2014-03-26: 1000 mL via INTRAVENOUS

## 2014-03-26 NOTE — ED Provider Notes (Signed)
Complains of periumbilical pain for the past 3 weeks. He been having small bowel movements no blood per rectum. He is presently asymptomatic. No fever no nausea or vomiting no other. On exam no distress abdomen normal active bowel sounds nontender genitalia normal male rectal normal tone brown stool no gross blood Lactate may be elevated secondary to patient's being on metformin  [name] Dakin, MD 03/27/14 0021

## 2014-03-26 NOTE — ED Notes (Signed)
Box lunch taken away explaInation given to pt and family member

## 2014-03-26 NOTE — ED Notes (Signed)
[name]- [phone] (daughter in law)

## 2014-03-26 NOTE — ED Notes (Signed)
To ct

## 2014-03-26 NOTE — ED Notes (Signed)
Returned from ct 

## 2014-03-26 NOTE — ED Notes (Signed)
The pt has a trach

## 2014-03-26 NOTE — ED Notes (Signed)
The pt is c/o abd pain and constipation for 2 weeks.  No n or v.. Unable to have  A bm except hard  Stool    The pt has a trach and is very hard of hearing

## 2014-03-26 NOTE — ED Notes (Signed)
Box lunch given

## 2014-03-26 NOTE — H&P (Addendum)
[facility] History and Physical  [name] ZOX:[id number] DOB: 09/13/1922 DOA: 03/26/2014  Referring physician: ED physician PCP: Mathews Argyle, MD  Specialists:   Chief Complaint: Abdominal  HPI: [name] is a 80 y.o. male with PMH of CAD s/p CABG, ICM EF of 25%, DM-II, remote h/o throat CA/trach, h/o RAS s/p stents, AFib on Apixaban, who present with abdominal pain.  At the baseline, patient uses walker and cane to walk around at home. He has been doing fine until 3 or 4 days ago when he started having constipation. He took MiraLax with little help. Then he developed nausea and abdominal pain. His abdominal pain is located at the lower abdomen. It is constant, dull, 5/10 in severity, nonradiating. It is not aggravated or alleviated with any known factors. He does not have fever, chills, vomiting, diarrhea, chest pain, cough, leg edema, dysuria, rashes.  In Ed, patient was found to have elevated lipase at 225. CT-abd showed approximate 1.5 cm calcified gallstone within the contracted gallbladder, no signs of cholecystitis. He is admitted to inpatient for further evaluation and treatment.   Review of Systems: As presented in the history of presenting illness, rest negative.  Where does patient live?  Lives alone at home Can patient participate in ADLs? Yes  Allergy: No Known Allergies  Past Medical History  Diagnosis Date  . Coronary artery disease     s/p CABG 1996 then PCI of SVG to OM 2007  . Diverticulosis   . Dyslipidemia   . Renal artery stenosis     s/p bilateral stents  . Hiatal hernia   . Colon polyps   . Carotid artery stenosis     50% BILATERAL 2014  . Peripheral neuropathy   . Anemia   . Hypothyroidism   . Nasal polyps   . Hypertension   . Diabetes mellitus   . Throat cancer S/P TRACHECTOMY   . Myocardial infarction   . Peripheral vascular disease     LOWER EXT  . Atrial fibrillation/flutter Aug 2015  . GERD (gastroesophageal reflux  disease)   . Shortness of breath     Hx: of  . HOH (hard of hearing) VERY   . Left bundle branch block (LBBB) on electrocardiogram 03/17/2014  . Ischemic cardiomyopathy Aug 2015    EF 25-30%   . History of throat cancer     s/p trach  . Aortic stenosis Aug 2015    mild to moderate  . Chronic anticoagulation Aug 2015    Eliquis  . CHF (congestive heart failure)     Past Surgical History  Procedure Laterality Date  . Coronary artery bypass graft      1996  . Cardiac catheterization      PCI of SVG to OM 2007  . Renal artery stent    . Tracheostomy    . Appendectomy    . Tonsillectomy    . Sp pta peripheral  March 2013    Lt SFA (Dr Saundra Shelling)  . Sp pta peripheral  May 2013    Rt SFA (Dr Bridgett Larsson)    Social History:  reports that he quit smoking about 67 years ago. His smoking use included Cigarettes. He smoked 0.30 packs per day. He has never used smokeless tobacco. He reports that he does not drink alcohol or use illicit drugs.  Family History:  Family History  Problem Relation Age of Onset  . Hypertension Mother   . Heart attack Brother   . Diabetes Son  Prior to Admission medications   Medication Sig Start Date End Date Taking? Authorizing Provider  amiodarone (PACERONE) 400 MG tablet Take 200 mg by mouth daily.   Yes Historical Provider, MD  furosemide (LASIX) 20 MG tablet Take 20 mg by mouth daily.   Yes Historical Provider, MD  levofloxacin (LEVAQUIN) 500 MG tablet Take 500 mg by mouth daily.   Yes Historical Provider, MD  levothyroxine (SYNTHROID, LEVOTHROID) 75 MCG tablet Take 75 mcg by mouth daily before breakfast.   Yes Historical Provider, MD  lisinopril (PRINIVIL,ZESTRIL) 5 MG tablet Take 5 mg by mouth daily.   Yes Historical Provider, MD  metFORMIN (GLUCOPHAGE) 500 MG tablet Take 1,000 mg by mouth 2 (two) times daily with a meal.   Yes Historical Provider, MD  albuterol (PROVENTIL HFA;VENTOLIN HFA) 108 (90 BASE) MCG/ACT inhaler Inhale 2-3 puffs into the lungs at  bedtime.     Historical Provider, MD  apixaban (ELIQUIS) 5 MG TABS tablet Take 2.5 mg by mouth 2 (two) times daily.    Historical Provider, MD  aspirin EC 81 MG tablet Take 81 mg by mouth 2 (two) times a week. Mondays and thursdays    Historical Provider, MD  atorvastatin (LIPITOR) 10 MG tablet Take 10 mg by mouth daily.    Historical Provider, MD  carvedilol (COREG) 6.25 MG tablet Take 6.25 mg by mouth 2 (two) times daily with a meal.    Historical Provider, MD  citalopram (CELEXA) 10 MG tablet Take 10 mg by mouth daily.    Historical Provider, MD  ferrous sulfate 325 (65 FE) MG tablet Take 325 mg by mouth daily with breakfast.    Historical Provider, MD  finasteride (PROSCAR) 5 MG tablet Take 5 mg by mouth daily.     Historical Provider, MD  fluticasone (FLONASE) 50 MCG/ACT nasal spray Place 2 sprays into both nostrils daily as needed for rhinitis or allergies.     Historical Provider, MD  glipiZIDE (GLUCOTROL) 10 MG tablet Take 20 mg by mouth 2 (two) times daily before a meal.     Historical Provider, MD  potassium chloride SA (K-DUR,KLOR-CON) 20 MEQ tablet Take 20 mEq by mouth 2 (two) times daily.    Historical Provider, MD    Physical Exam: Filed Vitals:   03/26/14 2230 03/26/14 2245 03/26/14 2300 03/26/14 2315  BP: 127/45 109/37 104/36 101/42  Pulse: 59 60 61 62  Temp:      TempSrc:      Resp: 22 20 23 25   Height:      Weight:      SpO2: 99% 99% 99% 100%   General: Not in acute distress HEENT:       Eyes: PERRL, EOMI, no scleral icterus       ENT: No discharge from the ears and nose, no pharynx injection, no tonsillar enlargement.        Neck: No JVD, no bruit, no mass felt. trach site clean. Cardiac: S1/S2, RRR, No murmurs, gallops or rubs Pulm: Good air movement bilaterally. Clear to auscultation bilaterally. No rales, wheezing, rhonchi or rubs. Abd: Soft, nondistended, very mild tender in lower abdomen, no rebound pain, no organomegaly, BS present Ext: No edema. 2+DP/PT  pulse bilaterally Musculoskeletal: No joint deformities, erythema, or stiffness, ROM full Skin: No rashes.  Neuro: Alert and oriented X3, cranial nerves II-XII grossly intact, muscle strength 5/5 in all extremeties, sensation to light touch intact. Brachial reflex 2+ bilaterally. Knee reflex 1+ bilaterally.  Psych: Patient is not psychotic, no suicidal  or hemocidal ideation.  Labs on Admission:  Basic Metabolic Panel:  Recent Labs Lab 03/26/14 1728  NA 139  K 4.1  CL 99  CO2 20  GLUCOSE 52*  BUN 37*  CREATININE 1.18  CALCIUM 8.6   Liver Function Tests:  Recent Labs Lab 03/26/14 1728  AST 14  ALT 14  ALKPHOS 62  BILITOT 0.7  PROT 6.2  ALBUMIN 3.4*    Recent Labs Lab 03/26/14 1728  LIPASE 225*   No results found for this basename: AMMONIA,  in the last 168 hours CBC:  Recent Labs Lab 03/26/14 1728  WBC 9.5  NEUTROABS 7.9*  HGB 12.9*  HCT 37.2*  MCV 91.0  PLT 333   Cardiac Enzymes: No results found for this basename: CKTOTAL, CKMB, CKMBINDEX, TROPONINI,  in the last 168 hours  BNP (last 3 results)  Recent Labs  02/12/14 0728 03/17/14 1030 03/18/14 0320  PROBNP 9440.0* 5030.0* 7135.0*   CBG: No results found for this basename: GLUCAP,  in the last 168 hours  Radiological Exams on Admission: Ct Abdomen Pelvis W Contrast  03/26/2014   CLINICAL DATA:  Lower abdominal pain involving both lower quadrants and constipation over the past 2 weeks. Chronic ventilator dependent respiratory failure.  EXAM: CT ABDOMEN AND PELVIS WITH CONTRAST  TECHNIQUE: Multidetector CT imaging of the abdomen and pelvis was performed using the standard protocol following bolus administration of intravenous contrast.  CONTRAST:  4mL OMNIPAQUE IOHEXOL 300 MG/ML IV. Oral contrast was also administered.  COMPARISON:  CTA abdomen 05/23/2009. CT abdomen 04/17/2006. No prior CT pelvis.  FINDINGS: Tortuous and redundant sigmoid colon which is predominantly decompressed. Moderate to large  stool burden throughout the colon. Sigmoid colon diverticulosis without evidence of acute diverticulitis. Normal-appearing small bowel. Stomach decompressed and unremarkable. No ascites.  Normal-appearing liver, spleen, and adrenal glands. Parapelvic cyst involving the otherwise normal-appearing right kidney. Normal left kidney. Approximate 1.5 cm calcified gallstone within the contracted gallbladder. No pericholecystic inflammation. No biliary ductal dilation. Extensive aortoiliofemoral atherosclerosis without aneurysm. Bilateral renal artery stents and left common iliac artery stent. Stenoses at the origins of the celiac, superior mesenteric and inferior mesenteric arteries. No significant lymphadenopathy.  Massively enlarged prostate gland measuring approximately 6.5 x 6.9 x 6.0 cm. Mildly distended urinary bladder. Numerous pelvic phleboliths.  Bone window images demonstrate lower thoracic spondylosis, multilevel degenerative disc disease, spondylosis and facet degenerative changes throughout the lumbar spine, and mild degenerative changes involving the sacroiliac joints. Visualized lung bases clear. Heart enlarged with left circumflex and right coronary atherosclerosis and dense mitral annular calcification.  IMPRESSION: 1. No acute abnormalities involving the abdomen or pelvis. 2. Moderate to large stool burden throughout the tortuous and redundant colon. Sigmoid diverticulosis without evidence of acute diverticulitis. 3. Marked prostate gland enlargement, measurements given above. 4. Cholelithiasis without CT evidence of acute cholecystitis.   Electronically Signed   By: Evangeline Dakin M.D.   On: 03/26/2014 20:28    EKG: Independently reviewed.   Assessment/Plan Principal Problem:   Pancreatitis, acute Active Problems:   Hypertension   Hypothyroidism   CHF (congestive heart failure)   CAD (coronary artery disease) of artery bypass graft   DM (diabetes mellitus)   A-fib  Acute pancreatitis:  Lipase elevated at 225. CT-abd showed approximate 1.5 cm calcified gallstone within the contracted gallbladder, no signs of cholecystitis. Received 1L of NS in ED. currently patient is a hemodynamically stable. Hemoglobin is 12.9. - will admit to tele bed - IVF: will not give normal IVF given  his EF 25 to 30%.  - will hold Lasix tonight - NPO - Zofran for nausea - US-abdomen to further evaluate biliary system.  CHF: 2-D echo on 02/12/14 showed EF 25-30%. He is on Lasix. Currently he is euvolemic clinically. -will hold lasix tonight in the setting of acute pancreatitis -Watch volume status carefully  Hypothyroidism -continue synthroid  CAD (coronary artery disease) of artery bypass graft: h/o PCI of SVG to OM in 2007. No chest pain now. -continue ASA/Coreg/statin  DM (diabetes mellitus): last A1c was 7.9 on 02/07/14. Lactic acid is elevated at 6.00 which is likely due to starvation 2/2 to abdominal pain or Metformin use. -hold oral meds - SSI  A-fib: HR well controlled -continue coreg and amiodarone - continue Eliquis  HTN: bp is 111/43 on admission - continue home meds.  Remote h/o Throat cancer: -s/p tach, trach care  DVT ppx: on Eliquis, SCD Code Status: Full code Family Communication:  Yes, patient's  daughter-in-law  at bed side Disposition Plan: Admit to inpatient   Date of Service 03/26/2014    Ivor Costa [facility] Pager [phone]  If 7PM-7AM, please contact night-coverage [email or web address] Password [email or web address] 03/26/2014, 11:30 PM

## 2014-03-27 ENCOUNTER — Inpatient Hospital Stay (HOSPITAL_COMMUNITY): Payer: Medicare Other

## 2014-03-27 ENCOUNTER — Encounter (HOSPITAL_COMMUNITY): Payer: Self-pay

## 2014-03-27 DIAGNOSIS — E860 Dehydration: Secondary | ICD-10-CM

## 2014-03-27 DIAGNOSIS — K859 Acute pancreatitis, unspecified: Secondary | ICD-10-CM

## 2014-03-27 DIAGNOSIS — I429 Cardiomyopathy, unspecified: Secondary | ICD-10-CM

## 2014-03-27 LAB — GLUCOSE, CAPILLARY
GLUCOSE-CAPILLARY: 120 mg/dL — AB (ref 70–99)
GLUCOSE-CAPILLARY: 50 mg/dL — AB (ref 70–99)
GLUCOSE-CAPILLARY: 117 mg/dL — AB (ref 70–99)
GLUCOSE-CAPILLARY: 129 mg/dL — AB (ref 70–99)
Glucose-Capillary: 42 mg/dL — CL (ref 70–99)
Glucose-Capillary: 145 mg/dL — ABNORMAL HIGH (ref 70–99)

## 2014-03-27 LAB — COMPREHENSIVE METABOLIC PANEL
ALBUMIN: 2.8 g/dL — AB (ref 3.5–5.2)
ALT: 12 U/L (ref 0–53)
AST: 12 U/L (ref 0–37)
Alkaline Phosphatase: 55 U/L (ref 39–117)
Anion gap: 13 (ref 5–15)
BUN: 31 mg/dL — ABNORMAL HIGH (ref 6–23)
CALCIUM: 7.4 mg/dL — AB (ref 8.4–10.5)
CO2: 25 meq/L (ref 19–32)
Chloride: 100 mEq/L (ref 96–112)
Creatinine, Ser: 1.17 mg/dL (ref 0.50–1.35)
GFR calc Af Amer: 62 mL/min — ABNORMAL LOW (ref >90)
GFR calc non Af Amer: 53 mL/min — ABNORMAL LOW (ref >90)
Glucose, Bld: 90 mg/dL (ref 70–99)
Potassium: 3.5 mEq/L — ABNORMAL LOW (ref 3.7–5.3)
SODIUM: 138 meq/L (ref 137–147)
TOTAL PROTEIN: 5.5 g/dL — AB (ref 6.0–8.3)
Total Bilirubin: 0.5 mg/dL (ref 0.3–1.2)

## 2014-03-27 LAB — CBC
HEMATOCRIT: 33.1 % — AB (ref 39.0–52.0)
Hemoglobin: 11.4 g/dL — ABNORMAL LOW (ref 13.0–17.0)
MCH: 31.4 pg (ref 26.0–34.0)
MCHC: 34.4 g/dL (ref 30.0–36.0)
MCV: 91.2 fL (ref 78.0–100.0)
Platelets: 267 10*3/uL (ref 150–400)
RBC: 3.63 MIL/uL — ABNORMAL LOW (ref 4.22–5.81)
RDW: 13.4 % (ref 11.5–15.5)
WBC: 7.3 10*3/uL (ref 4.0–10.5)

## 2014-03-27 LAB — LIPASE, BLOOD: Lipase: 71 U/L — ABNORMAL HIGH (ref 11–59)

## 2014-03-27 LAB — LACTIC ACID, PLASMA: Lactic Acid, Venous: 2.3 mmol/L — ABNORMAL HIGH (ref 0.5–2.2)

## 2014-03-27 MED ORDER — SODIUM CHLORIDE 0.9 % IV SOLN: 250.0000 mL | INTRAVENOUS | Status: DC | PRN

## 2014-03-27 MED ORDER — FINASTERIDE 5 MG PO TABS
5.0000 mg | ORAL_TABLET | Freq: Every day | ORAL | Status: DC
  Administered 2014-03-27 – 2014-03-28 (×2): 5 mg via ORAL
  Filled 2014-03-27 (×2): qty 1

## 2014-03-27 MED ORDER — LEVOTHYROXINE SODIUM 75 MCG PO TABS
75.0000 ug | ORAL_TABLET | Freq: Every day | ORAL | Status: DC
  Administered 2014-03-27 – 2014-03-29 (×3): 75 ug via ORAL
  Filled 2014-03-27 (×4): qty 1

## 2014-03-27 MED ORDER — ONDANSETRON HCL 4 MG PO TABS: 4.0000 mg | ORAL_TABLET | Freq: Four times a day (QID) | ORAL | Status: DC | PRN

## 2014-03-27 MED ORDER — DEXTROSE 50 % IV SOLN
25.0000 mL | Freq: Once | INTRAVENOUS | Status: AC | PRN
  Administered 2014-03-27: 25 mL via INTRAVENOUS

## 2014-03-27 MED ORDER — SODIUM CHLORIDE 0.9 % IJ SOLN
3.0000 mL | INTRAMUSCULAR | Status: DC | PRN
  Administered 2014-03-29: 3 mL via INTRAVENOUS

## 2014-03-27 MED ORDER — ONDANSETRON HCL 4 MG/2ML IJ SOLN: 4.0000 mg | Freq: Four times a day (QID) | INTRAMUSCULAR | Status: DC | PRN

## 2014-03-27 MED ORDER — ALBUTEROL SULFATE (2.5 MG/3ML) 0.083% IN NEBU
3.0000 mL | INHALATION_SOLUTION | Freq: Every day | RESPIRATORY_TRACT | Status: DC
  Administered 2014-03-27 (×2): 3 mL via RESPIRATORY_TRACT
  Filled 2014-03-27 (×2): qty 3

## 2014-03-27 MED ORDER — SODIUM CHLORIDE 0.9 % IJ SOLN
3.0000 mL | Freq: Two times a day (BID) | INTRAMUSCULAR | Status: DC
  Administered 2014-03-27 – 2014-03-29 (×4): 3 mL via INTRAVENOUS

## 2014-03-27 MED ORDER — AMIODARONE HCL 200 MG PO TABS
200.0000 mg | ORAL_TABLET | Freq: Every day | ORAL | Status: DC
  Administered 2014-03-27 – 2014-03-29 (×3): 200 mg via ORAL
  Filled 2014-03-27 (×3): qty 1

## 2014-03-27 MED ORDER — SENNOSIDES-DOCUSATE SODIUM 8.6-50 MG PO TABS
1.0000 | ORAL_TABLET | Freq: Every evening | ORAL | Status: DC | PRN
  Filled 2014-03-27: qty 1

## 2014-03-27 MED ORDER — ASPIRIN EC 81 MG PO TBEC
81.0000 mg | DELAYED_RELEASE_TABLET | ORAL | Status: DC
  Administered 2014-03-29: 81 mg via ORAL
  Filled 2014-03-27 (×2): qty 1

## 2014-03-27 MED ORDER — LISINOPRIL 5 MG PO TABS
5.0000 mg | ORAL_TABLET | Freq: Every day | ORAL | Status: DC
  Administered 2014-03-27: 5 mg via ORAL
  Filled 2014-03-27 (×2): qty 1

## 2014-03-27 MED ORDER — BISACODYL 10 MG RE SUPP
10.0000 mg | Freq: Every day | RECTAL | Status: DC
  Administered 2014-03-27 – 2014-03-29 (×3): 10 mg via RECTAL
  Filled 2014-03-27 (×3): qty 1

## 2014-03-27 MED ORDER — DEXTROSE 50 % IV SOLN
12.5000 g | Freq: Once | INTRAVENOUS | Status: AC
  Administered 2014-03-27 (×2): 12.5 g via INTRAVENOUS

## 2014-03-27 MED ORDER — APIXABAN 2.5 MG PO TABS
2.5000 mg | ORAL_TABLET | Freq: Two times a day (BID) | ORAL | Status: DC
  Administered 2014-03-27 – 2014-03-29 (×6): 2.5 mg via ORAL
  Filled 2014-03-27 (×7): qty 1

## 2014-03-27 MED ORDER — CITALOPRAM HYDROBROMIDE 10 MG PO TABS
10.0000 mg | ORAL_TABLET | Freq: Every day | ORAL | Status: DC
  Administered 2014-03-27 – 2014-03-29 (×3): 10 mg via ORAL
  Filled 2014-03-27 (×3): qty 1

## 2014-03-27 MED ORDER — ATORVASTATIN CALCIUM 10 MG PO TABS
10.0000 mg | ORAL_TABLET | Freq: Every day | ORAL | Status: DC
  Administered 2014-03-27 – 2014-03-29 (×3): 10 mg via ORAL
  Filled 2014-03-27 (×3): qty 1

## 2014-03-27 MED ORDER — INSULIN ASPART 100 UNIT/ML ~~LOC~~ SOLN
0.0000 [IU] | Freq: Three times a day (TID) | SUBCUTANEOUS | Status: DC
  Administered 2014-03-27 – 2014-03-28 (×2): 1 [IU] via SUBCUTANEOUS
  Administered 2014-03-28: 3 [IU] via SUBCUTANEOUS
  Administered 2014-03-28: 17:00:00 via SUBCUTANEOUS
  Administered 2014-03-29: 3 [IU] via SUBCUTANEOUS
  Administered 2014-03-29: 2 [IU] via SUBCUTANEOUS

## 2014-03-27 MED ORDER — FERROUS SULFATE 325 (65 FE) MG PO TABS
325.0000 mg | ORAL_TABLET | Freq: Every day | ORAL | Status: DC
  Administered 2014-03-27 – 2014-03-29 (×3): 325 mg via ORAL
  Filled 2014-03-27 (×4): qty 1

## 2014-03-27 MED ORDER — CARVEDILOL 6.25 MG PO TABS
6.2500 mg | ORAL_TABLET | Freq: Two times a day (BID) | ORAL | Status: DC
  Administered 2014-03-27 – 2014-03-28 (×3): 6.25 mg via ORAL
  Filled 2014-03-27 (×5): qty 1

## 2014-03-27 MED ORDER — DEXTROSE 50 % IV SOLN
INTRAVENOUS | Status: AC
  Filled 2014-03-27: qty 50

## 2014-03-27 MED ORDER — SODIUM CHLORIDE 0.9 % IJ SOLN
3.0000 mL | Freq: Two times a day (BID) | INTRAMUSCULAR | Status: DC
  Administered 2014-03-27 – 2014-03-28 (×4): 3 mL via INTRAVENOUS

## 2014-03-27 MED ORDER — DEXTROSE 50 % IV SOLN
INTRAVENOUS | Status: AC
  Administered 2014-03-27: 12.5 g via INTRAVENOUS
  Filled 2014-03-27: qty 50

## 2014-03-27 NOTE — Progress Notes (Signed)
The patient's bed alarm was turned onto the second most sensitive setting.  The patient was educated on using the call bell if he needed to get up and was showed the red button on the call bell.  The patient verbalized understanding.

## 2014-03-27 NOTE — Progress Notes (Signed)
AM glucose was 47 pt npo dextrose given. Glucose after dextrose was 120

## 2014-03-27 NOTE — Progress Notes (Signed)
PT Cancellation Note  Patient Details Name: [name] MRN: [id number] DOB: 1923/04/21   Cancelled Treatment:    Reason Eval/Treat Not Completed: Patient not medically ready  Pt with MD order for strict bedrest written at same time as PT order. Please update activity order when appropriate for PT to proceed with eval.   Lorriane Shire 03/27/2014, 12:48 PM

## 2014-03-27 NOTE — Progress Notes (Addendum)
[facility] PROGRESS NOTE  [name] ZOX:[id number] DOB: 1923/02/26 DOA: 03/26/2014 PCP: Mathews Argyle, MD  Assessment/Plan: Acute pancreatitis Lipase improved. CT and abdominal US showing cholelithiasis without cholecystitis. ? Possibly for gall stone pancreatitis.  clinically improving  will start on clears and monitor Serial abd exam  CHF:   EF 25-30%. He is on Lasix. Currently euvolemic  -resume lasix, continue coreg and statin  constipation  ordered suppository. Will order enema if not improved  Hypothyroidism  -continue synthroid   CAD (coronary artery disease) of artery bypass graft -continue ASA, coreg and statin   DM (diabetes mellitus):  last A1c was 7.9 . Lactic acid is elevated at 6.00 on admission, now improved.  hold metformin - SSI   A-fib:  HR controlled  -continue coreg and amiodarone  - continue Eliquis   HTN:   - continue home meds.   Remote h/o Throat cancer:  -s/p tach, trach care  malnutrition  nutrition consult once tolerating  advanced diet  DVT prophylaxis: on eliquis  Diet: clear liquid  Code Status: full Family Communication: spoke with son [name] on the phone Disposition Plan: home once improved   Consultants:  none  Procedures:  CT abd   [place] abd  Antibiotics:  none  HPI/Subjective: reports some soreness in his abdomen but much improved Objective: Filed Vitals:   03/27/14 1332  BP: 110/45  Pulse: 64  Temp: 97.3 F (36.3 C)  Resp: 24    Intake/Output Summary (Last 24 hours) at 03/27/14 1437 Last data filed at 03/27/14 1339  Gross per 24 hour  Intake    450 ml  Output    650 ml  Net   -200 ml   Filed Weights   03/26/14 1718 03/27/14 0012  Weight: 65.772 kg (145 lb) 61.735 kg (136 lb 1.6 oz)    Exam:   General:  Elderly thin built male in NAD  Heent: trach site appears clean  Cardiovascular: S1 &S2 irregular, no murmurs  Respiratory: clear b/l  Abdomen:  Soft, ND, NT,  BS+  Musculoskeletal: warm, no edema  Data Reviewed: Basic Metabolic Panel:  Recent Labs Lab 03/26/14 1728 03/27/14 0103  NA 139 138  K 4.1 3.5*  CL 99 100  CO2 20 25  GLUCOSE 52* 90  BUN 37* 31*  CREATININE 1.18 1.17  CALCIUM 8.6 7.4*   Liver Function Tests:  Recent Labs Lab 03/26/14 1728 03/27/14 0103  AST 14 12  ALT 14 12  ALKPHOS 62 55  BILITOT 0.7 0.5  PROT 6.2 5.5*  ALBUMIN 3.4* 2.8*    Recent Labs Lab 03/26/14 1728 03/27/14 0103  LIPASE 225* 71*   No results found for this basename: AMMONIA,  in the last 168 hours CBC:  Recent Labs Lab 03/26/14 1728 03/27/14 0103  WBC 9.5 7.3  NEUTROABS 7.9*  --   HGB 12.9* 11.4*  HCT 37.2* 33.1*  MCV 91.0 91.2  PLT 333 267   Cardiac Enzymes: No results found for this basename: CKTOTAL, CKMB, CKMBINDEX, TROPONINI,  in the last 168 hours BNP (last 3 results)  Recent Labs  02/12/14 0728 03/17/14 1030 03/18/14 0320  PROBNP 9440.0* 5030.0* 7135.0*   CBG:  Recent Labs Lab 03/27/14 0628 03/27/14 0652 03/27/14 1207 03/27/14 1342  GLUCAP 42* 120* 50* 117*    No results found for this or any previous visit (from the past 240 hour(s)).   Studies: US Abdomen Complete  03/27/2014   CLINICAL DATA:  Subsequent encounter for pancreatitis.  EXAM: ULTRASOUND  ABDOMEN COMPLETE  COMPARISON:  CT abdomen and pelvis 03/26/2014.  FINDINGS: Gallbladder: ML bowel shadowing stone in the gallbladder measures 1.9 cm. Gallbladder wall thickness is within normal limits at 2.6 mm. There is no sonographic Murphy sign.  Common bile duct: Diameter: 3.9 mm, within normal limits.  Liver: No focal lesion identified. Within normal limits in parenchymal echogenicity.  IVC: No abnormality visualized.  Pancreas: Visualized portion unremarkable.  Spleen: A minimal amount of free fluid surrounds the spleen. The spleen parenchyma is normal. Maximal length is 6.2 cm, within normal limits.  Right Kidney: Length: 11.1 cm, within normal limits.  A parapelvic cyst is stable measuring 1.2 x 1.1 x 1.5 cm.  Left Kidney: Length: 11.2 cm, within normal limits. Echogenicity within normal limits. No mass or hydronephrosis visualized.  Abdominal aorta: No aneurysm visualized.  Other findings: A small amount of free fluid is noted about the spleen. A small left pleural effusion is evident as well.  IMPRESSION: 1. Cholelithiasis without evidence for cholecystitis. 2. A small amount of free fluid is noted about the spleen. 3. Small left pleural effusion. 4. Right parapelvic renal cyst measures 1.5 cm maximally.   Electronically Signed   By: Lawrence Santiago M.D.   On: 03/27/2014 12:01   Ct Abdomen Pelvis W Contrast  03/26/2014   CLINICAL DATA:  Lower abdominal pain involving both lower quadrants and constipation over the past 2 weeks. Chronic ventilator dependent respiratory failure.  EXAM: CT ABDOMEN AND PELVIS WITH CONTRAST  TECHNIQUE: Multidetector CT imaging of the abdomen and pelvis was performed using the standard protocol following bolus administration of intravenous contrast.  CONTRAST:  81mL OMNIPAQUE IOHEXOL 300 MG/ML IV. Oral contrast was also administered.  COMPARISON:  CTA abdomen 05/23/2009. CT abdomen 04/17/2006. No prior CT pelvis.  FINDINGS: Tortuous and redundant sigmoid colon which is predominantly decompressed. Moderate to large stool burden throughout the colon. Sigmoid colon diverticulosis without evidence of acute diverticulitis. Normal-appearing small bowel. Stomach decompressed and unremarkable. No ascites.  Normal-appearing liver, spleen, and adrenal glands. Parapelvic cyst involving the otherwise normal-appearing right kidney. Normal left kidney. Approximate 1.5 cm calcified gallstone within the contracted gallbladder. No pericholecystic inflammation. No biliary ductal dilation. Extensive aortoiliofemoral atherosclerosis without aneurysm. Bilateral renal artery stents and left common iliac artery stent. Stenoses at the origins of the celiac,  superior mesenteric and inferior mesenteric arteries. No significant lymphadenopathy.  Massively enlarged prostate gland measuring approximately 6.5 x 6.9 x 6.0 cm. Mildly distended urinary bladder. Numerous pelvic phleboliths.  Bone window images demonstrate lower thoracic spondylosis, multilevel degenerative disc disease, spondylosis and facet degenerative changes throughout the lumbar spine, and mild degenerative changes involving the sacroiliac joints. Visualized lung bases clear. Heart enlarged with left circumflex and right coronary atherosclerosis and dense mitral annular calcification.  IMPRESSION: 1. No acute abnormalities involving the abdomen or pelvis. 2. Moderate to large stool burden throughout the tortuous and redundant colon. Sigmoid diverticulosis without evidence of acute diverticulitis. 3. Marked prostate gland enlargement, measurements given above. 4. Cholelithiasis without CT evidence of acute cholecystitis.   Electronically Signed   By: Evangeline Dakin M.D.   On: 03/26/2014 20:28    Scheduled Meds: . albuterol  3 mL Inhalation QHS  . amiodarone  200 mg Oral Daily  . apixaban  2.5 mg Oral BID  . [START ON 03/29/2014] aspirin EC  81 mg Oral Once per day on Mon Thu  . atorvastatin  10 mg Oral Daily  . bisacodyl  10 mg Rectal Daily  . carvedilol  6.25 mg Oral BID WC  . citalopram  10 mg Oral Daily  . ferrous sulfate  325 mg Oral Q breakfast  . finasteride  5 mg Oral Daily  . insulin aspart  0-9 Units Subcutaneous TID WC  . levothyroxine  75 mcg Oral QAC breakfast  . lisinopril  5 mg Oral Daily  . sodium chloride  3 mL Intravenous Q12H  . sodium chloride  3 mL Intravenous Q12H   Continuous Infusions:    Time spent: 25 minutes    [name], [place]  [facility] Pager [phone]. If 7PM-7AM, please contact night-coverage at [email or web address], password [facility] 03/27/2014, 2:37 PM  LOS: 1 day

## 2014-03-28 DIAGNOSIS — I481 Persistent atrial fibrillation: Secondary | ICD-10-CM

## 2014-03-28 LAB — BASIC METABOLIC PANEL
Anion gap: 14 (ref 5–15)
BUN: 22 mg/dL (ref 6–23)
CALCIUM: 7.6 mg/dL — AB (ref 8.4–10.5)
CO2: 25 meq/L (ref 19–32)
Chloride: 97 mEq/L (ref 96–112)
Creatinine, Ser: 1.1 mg/dL (ref 0.50–1.35)
GFR calc Af Amer: 67 mL/min — ABNORMAL LOW (ref >90)
GFR calc non Af Amer: 57 mL/min — ABNORMAL LOW (ref >90)
GLUCOSE: 125 mg/dL — AB (ref 70–99)
POTASSIUM: 3.6 meq/L — AB (ref 3.7–5.3)
Sodium: 136 mEq/L — ABNORMAL LOW (ref 137–147)

## 2014-03-28 LAB — GLUCOSE, CAPILLARY
GLUCOSE-CAPILLARY: 143 mg/dL — AB (ref 70–99)
GLUCOSE-CAPILLARY: 238 mg/dL — AB (ref 70–99)
Glucose-Capillary: 228 mg/dL — ABNORMAL HIGH (ref 70–99)
Glucose-Capillary: 97 mg/dL (ref 70–99)

## 2014-03-28 LAB — LIPASE, BLOOD: Lipase: 111 U/L — ABNORMAL HIGH (ref 11–59)

## 2014-03-28 MED ORDER — CARVEDILOL 3.125 MG PO TABS
3.1250 mg | ORAL_TABLET | Freq: Two times a day (BID) | ORAL | Status: DC
  Administered 2014-03-28 – 2014-03-29 (×2): 3.125 mg via ORAL
  Filled 2014-03-28 (×4): qty 1

## 2014-03-28 MED ORDER — POTASSIUM CHLORIDE CRYS ER 20 MEQ PO TBCR
40.0000 meq | EXTENDED_RELEASE_TABLET | Freq: Once | ORAL | Status: AC
  Administered 2014-03-28: 40 meq via ORAL
  Filled 2014-03-28: qty 2

## 2014-03-28 MED ORDER — ALBUTEROL SULFATE (2.5 MG/3ML) 0.083% IN NEBU: 3.0000 mL | INHALATION_SOLUTION | RESPIRATORY_TRACT | Status: DC | PRN

## 2014-03-28 NOTE — Evaluation (Signed)
Physical Therapy Evaluation Patient Details Name: Coda Mathey MRN: [id number] DOB: 1923-03-05 Today's Date: 03/28/2014   History of Present Illness  80 y.o. male with a past medical history significant for CAD, s/p CABG in '96, PCI in '07. He has ICM with an EF of 25-30% and mild to moderate AS Aug 2015. He was admitted with CHF exacerbation after fall in bathroom at the nursing home visiting his wife.  This is third admission since August.  Clinical Impression   Pt admitted with above. Pt currently with functional limitations due to the deficits listed below (see PT Problem List).  Pt will benefit from skilled PT to increase their independence and safety with mobility to allow discharge to the venue listed below.       Follow Up Recommendations Home health PT;Supervision/Assistance - 24 hour    Equipment Recommendations  Rolling walker with 5" wheels    Recommendations for Other Services       Precautions / Restrictions Precautions Precautions: Other (comment) (discussed fall risk with RN) Precaution Comments: old tracheostomy; HOH      Mobility  Bed Mobility Overal bed mobility: Modified Independent Bed Mobility: Supine to Sit              Transfers Overall transfer level: Needs assistance Equipment used: Rolling walker (2 wheeled) Transfers: Sit to/from Stand Sit to Stand: Supervision         General transfer comment: verbal cues for hand placement and demonstration   Ambulation/Gait Ambulation/Gait assistance: Supervision Ambulation Distance (Feet): 200 Feet Assistive device: Rolling walker (2 wheeled) Gait Pattern/deviations: Step-through pattern;Trunk flexed     General Gait Details: continued cues for posture and RW proximity; no overt Loss of balance  Stairs Stairs: Yes Stairs assistance: Min guard Stair Management: Two rails;Step to pattern;Forwards Number of Stairs: 4 General stair comments: Pt requires min/guard steadying assist when  ascending using two rails;   Cues for safety and to decrease speed as he was somewhat impulsive on stairs.   Wheelchair Mobility    Modified Rankin (Stroke Patients Only)       Balance                                             Pertinent Vitals/Pain Pain Assessment: No/denies pain    Home Living Family/patient expects to be discharged to:: Private residence Living Arrangements: Spouse/significant other Available Help at Discharge: Other (Comment) (had been staying with son and wife since ver recent admissio) Type of Home: House Home Access: Stairs to enter Entrance Stairs-Rails: Psychiatric nurse of Steps: 3-4 at sons house, ramp to enter pts home Home Layout: One level Home Equipment: Cane - single point;Walker - 4 wheels Additional Comments: pt prepares own breakfast, goes to daughter's for supper    Prior Function Level of Independence: Independent         Comments: still drives, hx of tracheostomy     Hand Dominance   Dominant Hand: Right    Extremity/Trunk Assessment   Upper Extremity Assessment: Overall WFL for tasks assessed           Lower Extremity Assessment: Overall WFL for tasks assessed (for simple mobility tasks)      Cervical / Trunk Assessment: Kyphotic  Communication   Communication: HOH  Cognition Arousal/Alertness: Awake/alert Behavior During Therapy: WFL for tasks assessed/performed Overall Cognitive Status: History of cognitive impairments - at  baseline                 General Comments: continued trouble remembering where to put hands for transfers despite three reminders    General Comments      Exercises        Assessment/Plan    PT Assessment Patient needs continued PT services  PT Diagnosis Difficulty walking   PT Problem List Decreased strength;Decreased activity tolerance;Decreased balance;Decreased mobility;Decreased knowledge of use of DME;Decreased safety  awareness;Decreased knowledge of precautions  PT Treatment Interventions DME instruction;Gait training;Stair training;Functional mobility training;Therapeutic activities;Therapeutic exercise;Balance training;Patient/family education   PT Goals (Current goals can be found in the Care Plan section) Acute Rehab PT Goals Patient Stated Goal: reports he hopes to dc home tomorrow PT Goal Formulation: With patient Time For Goal Achievement: 04/04/14 Potential to Achieve Goals: Good    Frequency Min 3X/week   Barriers to discharge        Co-evaluation               End of Session Equipment Utilized During Treatment: Gait belt Activity Tolerance: Patient tolerated treatment well Patient left: in bed;with call bell/phone within reach Nurse Communication: Mobility status         Time: 9924-2683 PT Time Calculation (min): 9 min   Charges:   PT Evaluation $Initial PT Evaluation Tier I: 1 Procedure     PT G CodesRoney Marion Hamff 03/28/2014, 12:46 PM  Roney Marion, [place] Pager [phone] Office [phone]

## 2014-03-28 NOTE — Progress Notes (Signed)
Occupational Therapy Evaluation Patient Details Name: [name] MRN: [id number] DOB: Feb 25, 1923 Today's Date: 03/28/2014    History of Present Illness 80 y.o. male with a past medical history significant for CAD, s/p CABG in '96, PCI in '07. He has ICM with an EF of 25-30% and mild to moderate AS Aug 2015. He was admitted with CHF exacerbation after fall in bathroom at the nursing home visiting his wife.  This is third admission since August.   Clinical Impression   PTA pt reports that he lived at home and his son and daughter-in-law came by to assist him. Pt presents with impaired balance and cognitive impairments, but is overall at a Supervision level for ADLs. Encouraged pt to contact his family regularly and he reports that they bring him meals daily. Pt will benefit from [facility] to address balance and safety with ADLs. No further acute OT needs.      Follow Up Recommendations  Home health OT;Supervision/Assistance - 24 hour    Equipment Recommendations  None recommended by OT    Recommendations for Other Services       Precautions / Restrictions Precautions Precautions: Other (comment) (discussed fall risk with RN) Precaution Comments: old tracheostomy; HOH Restrictions Weight Bearing Restrictions: No      Mobility Bed Mobility Overal bed mobility: Modified Independent Bed Mobility: Supine to Sit              Transfers Overall transfer level: Needs assistance Equipment used: None Transfers: Sit to/from Stand Sit to Stand: Supervision         General transfer comment: Pt able to stand with UEs on bed. Likely has better technique when RW is not directly in front of him as he is required to push up from the bed to stand rather than pulling on the walker.     Balance Overall balance assessment: Needs assistance Sitting-balance support: No upper extremity supported;Feet supported Sitting balance-Leahy Scale: Good     Standing balance support: During  functional activity;No upper extremity supported Standing balance-Leahy Scale: Fair Standing balance comment: Pt able to static stand without UE support and no LOB. Requires UE support for dynamic standing.                             ADL Overall ADL's : Needs assistance/impaired                                       General ADL Comments: pt overall at Supervision level for ADLs and functional mobility with use of RW. Pt was able to safely manage in his room for grooming tasks at sink. He reports that he sponge bathes at home and is independent with all ADLs.      Vision  Pt wears glasses at all times and reports no change from baseline.                    Perception Perception Perception Tested?: No   Praxis Praxis Praxis tested?: Within functional limits    Pertinent Vitals/Pain Pain Assessment: No/denies pain     Hand Dominance Right   Extremity/Trunk Assessment Upper Extremity Assessment Upper Extremity Assessment: Overall WFL for tasks assessed   Lower Extremity Assessment Lower Extremity Assessment: Overall WFL for tasks assessed   Cervical / Trunk Assessment Cervical / Trunk Assessment: Kyphotic   Communication Communication Communication: [facility]  Cognition Arousal/Alertness: Awake/alert Behavior During Therapy: WFL for tasks assessed/performed Overall Cognitive Status: History of cognitive impairments - at baseline       Memory: Decreased short-term memory         General Comments: continued trouble remembering where to put hands for transfers despite three reminders              Home Living Family/patient expects to be discharged to:: Private residence Living Arrangements: Spouse/significant other (wife unable to assist) Available Help at Discharge: Family;Available PRN/intermittently (has assistance from his sons and their wives) Type of Home: House Home Access: Stairs to enter [employer] of Steps:  3-4 at sons house, ramp to enter pts home Entrance Stairs-Rails: Right;Left Home Layout: One level     Bathroom Shower/Tub: Tub/shower unit;Walk-in shower Shower/tub characteristics: Architectural technologist: Standard     Home Equipment: Cane - single point;Walker - 4 wheels   Additional Comments: pt prepares own breakfast, goes to daughter's for supper      Prior Functioning/Environment Level of Independence: Independent        Comments: still drives, performs sponge baths, hx of tracheostomy    OT Diagnosis: Generalized weakness;Cognitive deficits   OT Problem List: Decreased strength;Impaired balance (sitting and/or standing);Decreased cognition;Decreased safety awareness;Decreased knowledge of use of DME or AE             End of Session Equipment Utilized During Treatment: Gait belt;Rolling walker  Activity Tolerance: Patient tolerated treatment well Patient left: in bed;with call bell/phone within reach   Time: 1520-1530 OT Time Calculation (min): 10 min Charges:  OT General Charges $OT Visit: 1 Procedure OT Evaluation $Initial OT Evaluation Tier I: 1 Procedure  Villa Herb M 03/28/2014, 3:36 PM  Cyndie Chime, OTR/L Occupational Therapist [phone] (pager)

## 2014-03-28 NOTE — Progress Notes (Signed)
[facility] PROGRESS NOTE  [name] ELF:[id number] DOB: 1923/01/25 DOA: 03/26/2014 PCP: Mathews Argyle, MD   brief narrative 80 y.o. male with PMH of CAD s/p CABG, ICM EF of 25%, DM-II, remote h/o throat CA/trach, h/o RAS s/p stents, AFib on Apixaban, who presented with acute abdominal pain. Patient had been constipated for past few days. He had acute onset nausea and epigastric pain constant, dull, 5/10 in severity, nonradiating.  In the ED, patient was found to have elevated lipase of  225. CT-abd showed approximate 1.5 cm calcified gallstone within the contracted gallbladder, no signs of cholecystitis. He is admitted to inpatient for further evaluation and treatment.     Assessment/Plan: Acute pancreatitis Lipase improving. CT and abdominal US showing cholelithiasis without cholecystitis. ? Possibly for gall stone pancreatitis. clinically improving. Advanced to full liquid diet. US abdomen again shows cholelithiasis without cholecystitis  CHF:   EF 25-30%.  on Lasix. Currently euvolemic  - continue coreg and statin  constipation Had good BM after receiving suppository  Hypothyroidism  -continue synthroid   CAD (coronary artery disease) of artery bypass graft -continue ASA, coreg and statin   DM (diabetes mellitus):  last A1c was 7.9 .   hold metformin - SSI   A-fib:  HR controlled  -continue coreg and amiodarone  - continue Eliquis   HTN:   - continue home meds.   Remote h/o Throat cancer:  -s/p trach.  trach care  malnutrition  nutrition consult once tolerating  advanced diet  DVT prophylaxis: on eliquis  Diet: full liquid  Code Status: full Family Communication: spoke with son and daughter in law at bedside Disposition Plan: home possibly in 1-2 days if improving   Consultants:  none  Procedures:  CT abd   [place] abd  Antibiotics:  none  HPI/Subjective: Denies further abd pain today. Had good BM yesterday  Objective: Filed  Vitals:   03/28/14 1338  BP: 110/38  Pulse: 60  Temp: 96 F (35.6 C)  Resp: 18    Intake/Output Summary (Last 24 hours) at 03/28/14 1343 Last data filed at 03/28/14 1300  Gross per 24 hour  Intake   1650 ml  Output    701 ml  Net    949 ml   Filed Weights   03/26/14 1718 03/27/14 0012 03/28/14 0623  Weight: 65.772 kg (145 lb) 61.735 kg (136 lb 1.6 oz) 58.7 kg (129 lb 6.6 oz)    Exam:   General:  Elderly thin built male in NAD  Heent: trach site appears clean  Cardiovascular: S1 &S2 irregular, no murmurs  Respiratory: clear b/l  Abdomen:  Soft, ND, NT, BS+  Musculoskeletal: warm, no edema  Data Reviewed: Basic Metabolic Panel:  Recent Labs Lab 03/26/14 1728 03/27/14 0103 03/28/14 0332  NA 139 138 136*  K 4.1 3.5* 3.6*  CL 99 100 97  CO2 20 25 25   GLUCOSE 52* 90 125*  BUN 37* 31* 22  CREATININE 1.18 1.17 1.10  CALCIUM 8.6 7.4* 7.6*   Liver Function Tests:  Recent Labs Lab 03/26/14 1728 03/27/14 0103  AST 14 12  ALT 14 12  ALKPHOS 62 55  BILITOT 0.7 0.5  PROT 6.2 5.5*  ALBUMIN 3.4* 2.8*    Recent Labs Lab 03/26/14 1728 03/27/14 0103 03/28/14 0332  LIPASE 225* 71* 111*   No results found for this basename: AMMONIA,  in the last 168 hours CBC:  Recent Labs Lab 03/26/14 1728 03/27/14 0103  WBC 9.5 7.3  NEUTROABS 7.9*  --  HGB 12.9* 11.4*  HCT 37.2* 33.1*  MCV 91.0 91.2  PLT 333 267   Cardiac Enzymes: No results found for this basename: CKTOTAL, CKMB, CKMBINDEX, TROPONINI,  in the last 168 hours BNP (last 3 results)  Recent Labs  02/12/14 0728 03/17/14 1030 03/18/14 0320  PROBNP 9440.0* 5030.0* 7135.0*   CBG:  Recent Labs Lab 03/27/14 1342 03/27/14 1646 03/27/14 2053 03/28/14 0644 03/28/14 1101  GLUCAP 117* 129* 145* 143* 228*    No results found for this or any previous visit (from the past 240 hour(s)).   Studies: US Abdomen Complete  03/27/2014   CLINICAL DATA:  Subsequent encounter for pancreatitis.   EXAM: ULTRASOUND ABDOMEN COMPLETE  COMPARISON:  CT abdomen and pelvis 03/26/2014.  FINDINGS: Gallbladder: ML bowel shadowing stone in the gallbladder measures 1.9 cm. Gallbladder wall thickness is within normal limits at 2.6 mm. There is no sonographic Murphy sign.  Common bile duct: Diameter: 3.9 mm, within normal limits.  Liver: No focal lesion identified. Within normal limits in parenchymal echogenicity.  IVC: No abnormality visualized.  Pancreas: Visualized portion unremarkable.  Spleen: A minimal amount of free fluid surrounds the spleen. The spleen parenchyma is normal. Maximal length is 6.2 cm, within normal limits.  Right Kidney: Length: 11.1 cm, within normal limits. A parapelvic cyst is stable measuring 1.2 x 1.1 x 1.5 cm.  Left Kidney: Length: 11.2 cm, within normal limits. Echogenicity within normal limits. No mass or hydronephrosis visualized.  Abdominal aorta: No aneurysm visualized.  Other findings: A small amount of free fluid is noted about the spleen. A small left pleural effusion is evident as well.  IMPRESSION: 1. Cholelithiasis without evidence for cholecystitis. 2. A small amount of free fluid is noted about the spleen. 3. Small left pleural effusion. 4. Right parapelvic renal cyst measures 1.5 cm maximally.   Electronically Signed   By: Lawrence Santiago M.D.   On: 03/27/2014 12:01   Ct Abdomen Pelvis W Contrast  03/26/2014   CLINICAL DATA:  Lower abdominal pain involving both lower quadrants and constipation over the past 2 weeks. Chronic ventilator dependent respiratory failure.  EXAM: CT ABDOMEN AND PELVIS WITH CONTRAST  TECHNIQUE: Multidetector CT imaging of the abdomen and pelvis was performed using the standard protocol following bolus administration of intravenous contrast.  CONTRAST:  86mL OMNIPAQUE IOHEXOL 300 MG/ML IV. Oral contrast was also administered.  COMPARISON:  CTA abdomen 05/23/2009. CT abdomen 04/17/2006. No prior CT pelvis.  FINDINGS: Tortuous and redundant sigmoid colon  which is predominantly decompressed. Moderate to large stool burden throughout the colon. Sigmoid colon diverticulosis without evidence of acute diverticulitis. Normal-appearing small bowel. Stomach decompressed and unremarkable. No ascites.  Normal-appearing liver, spleen, and adrenal glands. Parapelvic cyst involving the otherwise normal-appearing right kidney. Normal left kidney. Approximate 1.5 cm calcified gallstone within the contracted gallbladder. No pericholecystic inflammation. No biliary ductal dilation. Extensive aortoiliofemoral atherosclerosis without aneurysm. Bilateral renal artery stents and left common iliac artery stent. Stenoses at the origins of the celiac, superior mesenteric and inferior mesenteric arteries. No significant lymphadenopathy.  Massively enlarged prostate gland measuring approximately 6.5 x 6.9 x 6.0 cm. Mildly distended urinary bladder. Numerous pelvic phleboliths.  Bone window images demonstrate lower thoracic spondylosis, multilevel degenerative disc disease, spondylosis and facet degenerative changes throughout the lumbar spine, and mild degenerative changes involving the sacroiliac joints. Visualized lung bases clear. Heart enlarged with left circumflex and right coronary atherosclerosis and dense mitral annular calcification.  IMPRESSION: 1. No acute abnormalities involving the abdomen or pelvis. 2.  Moderate to large stool burden throughout the tortuous and redundant colon. Sigmoid diverticulosis without evidence of acute diverticulitis. 3. Marked prostate gland enlargement, measurements given above. 4. Cholelithiasis without CT evidence of acute cholecystitis.   Electronically Signed   By: Evangeline Dakin M.D.   On: 03/26/2014 20:28    Scheduled Meds: . albuterol  3 mL Inhalation QHS  . amiodarone  200 mg Oral Daily  . apixaban  2.5 mg Oral BID  . [START ON 03/29/2014] aspirin EC  81 mg Oral Once per day on Mon Thu  . atorvastatin  10 mg Oral Daily  . bisacodyl   10 mg Rectal Daily  . carvedilol  3.125 mg Oral BID WC  . citalopram  10 mg Oral Daily  . ferrous sulfate  325 mg Oral Q breakfast  . insulin aspart  0-9 Units Subcutaneous TID WC  . levothyroxine  75 mcg Oral QAC breakfast  . sodium chloride  3 mL Intravenous Q12H  . sodium chloride  3 mL Intravenous Q12H   Continuous Infusions:    Time spent: 25 minutes    Eavan Gonterman, [place]  [facility] Pager [phone]. If 7PM-7AM, please contact night-coverage at [email or web address], password [facility] 03/28/2014, 1:43 PM  LOS: 2 days

## 2014-03-29 ENCOUNTER — Encounter (HOSPITAL_COMMUNITY): Payer: Self-pay | Admitting: *Deleted

## 2014-03-29 ENCOUNTER — Ambulatory Visit: Payer: Medicare Other | Admitting: Interventional Cardiology

## 2014-03-29 DIAGNOSIS — E872 Acidosis, unspecified: Secondary | ICD-10-CM | POA: Diagnosis present

## 2014-03-29 LAB — LACTIC ACID, PLASMA: LACTIC ACID, VENOUS: 2 mmol/L (ref 0.5–2.2)

## 2014-03-29 LAB — GLUCOSE, CAPILLARY
GLUCOSE-CAPILLARY: 120 mg/dL — AB (ref 70–99)
GLUCOSE-CAPILLARY: 177 mg/dL — AB (ref 70–99)
Glucose-Capillary: 246 mg/dL — ABNORMAL HIGH (ref 70–99)

## 2014-03-29 LAB — LIPASE, BLOOD: Lipase: 47 U/L (ref 11–59)

## 2014-03-29 MED ORDER — SENNOSIDES-DOCUSATE SODIUM 8.6-50 MG PO TABS: 1.0000 | ORAL_TABLET | Freq: Every evening | ORAL | Status: DC | PRN

## 2014-03-29 NOTE — Discharge Summary (Addendum)
Physician Discharge Summary  [name]:[id number] DOB: 1923-02-09 DOA: 03/26/2014  PCP: Mathews Argyle, MD  Admit date: 03/26/2014 Discharge date: 03/29/2014  Time spent: 35  minutes  Recommendations for Outpatient Follow-up:  1. Discharge home with outpt PCP and cardiology follow up  Discharge Diagnoses:  Principal Problem:   Pancreatitis, acute  Active Problems:   Hypertension   Hypothyroidism   CHF (congestive heart failure)   CAD (coronary artery disease) of artery bypass graft   DM (diabetes mellitus)   A-fib   Lactic acidosis   Discharge Condition: fair  Diet recommendation: heart healthy/ diabetic  Filed Weights   03/27/14 0012 03/28/14 0623 03/29/14 0635  Weight: 61.735 kg (136 lb 1.6 oz) 58.7 kg (129 lb 6.6 oz) 58.695 kg (129 lb 6.4 oz)    History of present illness:  80 y.o. male with PMH of CAD s/p CABG, ICM EF of 25%, DM-II, remote h/o throat CA/trach, h/o RAS s/p stents, AFib on Apixaban, who presented with acute abdominal pain. Patient had been constipated for past few days. He had acute onset nausea and epigastric pain constant, dull, 5/10 in severity, nonradiating.  In the ED, patient was found to have elevated lipase of 225. CT-abd showed approximate 1.5 cm calcified gallstone within the contracted gallbladder, no signs of cholecystitis. He is admitted to inpatient for further evaluation and treatment.      Hospital Course:  Acute pancreatitis   CT and abdominal US showing cholelithiasis without cholecystitis. ? Possibly for gall stone pancreatitis. clinically improving and doesnot have any symptoms. Advanced to regular diet and tolerating it. US abdomen again shows cholelithiasis without cholecystitis .  Lactic acidosis  level of 6 on admission with AG of 20, now improved. is 2 on discharge. Will discontinue metformin. Follow as outpt  CHF:  EF 25-30%. on Lasix. Currently euvolemic  - continue coreg and statin  Has appt with his  cardiologist  Dr Irish Lack on 10/15  constipation  Had good BM after receiving suppository   Hypothyroidism  -continue synthroid   CAD (coronary artery disease) of artery bypass graft  -continue ASA, coreg and statin   DM (diabetes mellitus):  last A1c was 7.9 .  Discontinue metformin given lactic acidosis. Continue glipizide and acarbose. Since acarbose has higher incidence of abdominal pain and bloating, may ned to discontinue it as well if abdominal pain recurs.   A-fib:  HR controlled  -continue coreg and amiodarone  - continue Eliquis   HTN:  - continue home meds.   Remote h/o Throat cancer:  -s/p trach. Stable  Malnutrition and deconditioning nutrition consult as outpt. Seen by PT and recommends home with HHPT   Code Status: full   Family Communication: spoke with son and daughter in law on 10/11  Disposition Plan: home with HHPT  Consultants:  None   Procedures:  CT abd  [place] abd   Antibiotics:  none   Discharge Exam: Filed Vitals:   03/29/14 0635  BP: 106/48  Pulse: 69  Temp: 97 F (36.1 C)  Resp: 20   General: Elderly thin built male in NAD  Heent: trach site appears clean  Cardiovascular: S1 &S2 irregular, no murmurs  Respiratory: clear b/l  Abdomen: Soft, ND, NT, BS+  Musculoskeletal: warm, no edema    Discharge Instructions You were cared for by a hospitalist during your hospital stay. If you have any questions about your discharge medications or the care you received while you were in the hospital after you are discharged, you  can call the unit and asked to speak with the hospitalist on call if the hospitalist that took care of you is not available. Once you are discharged, your primary care physician will handle any further medical issues. Please note that NO REFILLS for any discharge medications will be authorized once you are discharged, as it is imperative that you return to your primary care physician (or establish a relationship  with a primary care physician if you do not have one) for your aftercare needs so that they can reassess your need for medications and monitor your lab values.   Current Discharge Medication List    START taking these medications   Details  senna-docusate (SENOKOT-S) 8.6-50 MG per tablet Take 1 tablet by mouth at bedtime as needed for mild constipation. Qty: 20 tablet, Refills: 0      CONTINUE these medications which have NOT CHANGED   Details  acarbose (PRECOSE) 100 MG tablet Take 100 mg by mouth 3 (three) times daily.    acetaminophen (TYLENOL) 500 MG tablet Take 500 mg by mouth every 6 (six) hours as needed for headache.    amiodarone (PACERONE) 400 MG tablet Take 200 mg by mouth daily.    apixaban (ELIQUIS) 5 MG TABS tablet Take 2.5 mg by mouth 2 (two) times daily.    aspirin EC 81 MG tablet Take 81 mg by mouth 2 (two) times a week. Mondays and thursdays    atorvastatin (LIPITOR) 10 MG tablet Take 10 mg by mouth daily.    carvedilol (COREG) 6.25 MG tablet Take 6.25 mg by mouth 2 (two) times daily with a meal.    citalopram (CELEXA) 10 MG tablet Take 10 mg by mouth daily.    ferrous sulfate 325 (65 FE) MG tablet Take 325 mg by mouth daily with breakfast.    finasteride (PROSCAR) 5 MG tablet Take 5 mg by mouth daily.     furosemide (LASIX) 20 MG tablet Take 20 mg by mouth daily.    glipiZIDE (GLUCOTROL) 10 MG tablet Take 20 mg by mouth 2 (two) times daily before a meal.     levothyroxine (SYNTHROID, LEVOTHROID) 75 MCG tablet Take 75 mcg by mouth daily before breakfast.    lisinopril (PRINIVIL,ZESTRIL) 5 MG tablet Take 5 mg by mouth daily.    polyethylene glycol (MIRALAX / GLYCOLAX) packet Take 17 g by mouth daily as needed for mild constipation.    potassium chloride SA (K-DUR,KLOR-CON) 20 MEQ tablet Take 20 mEq by mouth 2 (two) times daily.      STOP taking these medications     metFORMIN (GLUCOPHAGE) 500 MG tablet      albuterol (PROVENTIL HFA;VENTOLIN HFA) 108  (90 BASE) MCG/ACT inhaler        No Known Allergies Follow-up Information   Follow up with Mathews Argyle, MD On 04/05/2014. (appointment already scheduled for Monday @ 9:45 AM)    Specialty:  Internal Medicine   Contact information:   [address] [place] [address] [phone]       Follow up with [name]., MD On 04/01/2014. (@10  am)    Specialty:  Interventional Cardiology   Contact information:   [address] [place] [address] [phone]        The results of significant diagnostics from this hospitalization (including imaging, microbiology, ancillary and laboratory) are listed below for reference.    Significant Diagnostic Studies: Ct Head Wo Contrast  03/17/2014   CLINICAL DATA:  Weakness, fall.  EXAM: CT HEAD WITHOUT CONTRAST  TECHNIQUE: Contiguous axial images were obtained from the base of the skull through the vertex without intravenous contrast.  COMPARISON:  CT scan of February 26, 2006.  FINDINGS: Complete opacification of the left frontal and maxillary sinus is noted as well significant opacification of the left ethmoid sinuses. Prominent mucosal thickening of the left nasal turbinates is noted. Bony calvarium is otherwise intact. Mild diffuse cortical atrophy is noted. Mild chronic ischemic white matter disease is noted. Old left periventricular white matter infarction is noted. No mass effect or midline shift is noted. Ventricular size is within normal limits. There is no evidence of mass lesion, hemorrhage or acute infarction.  IMPRESSION: Mild diffuse cortical atrophy. Mild chronic ischemic white matter disease. Old left periventricular white matter infarction. No occur acute intracranial abnormality seen.  There is complete opacification of the left frontal and maxillary sinuses, as well as significant opacification of the left ethmoid sinuses. This is concerning for left frontal and maxillary mucoceles most  likely due to severe mucosal hypertrophy and inflammation of the left nasal turbinates.   Electronically Signed   By: Sabino Dick M.D.   On: 03/17/2014 14:15   US Abdomen Complete  03/27/2014   CLINICAL DATA:  Subsequent encounter for pancreatitis.  EXAM: ULTRASOUND ABDOMEN COMPLETE  COMPARISON:  CT abdomen and pelvis 03/26/2014.  FINDINGS: Gallbladder: ML bowel shadowing stone in the gallbladder measures 1.9 cm. Gallbladder wall thickness is within normal limits at 2.6 mm. There is no sonographic Murphy sign.  Common bile duct: Diameter: 3.9 mm, within normal limits.  Liver: No focal lesion identified. Within normal limits in parenchymal echogenicity.  IVC: No abnormality visualized.  Pancreas: Visualized portion unremarkable.  Spleen: A minimal amount of free fluid surrounds the spleen. The spleen parenchyma is normal. Maximal length is 6.2 cm, within normal limits.  Right Kidney: Length: 11.1 cm, within normal limits. A parapelvic cyst is stable measuring 1.2 x 1.1 x 1.5 cm.  Left Kidney: Length: 11.2 cm, within normal limits. Echogenicity within normal limits. No mass or hydronephrosis visualized.  Abdominal aorta: No aneurysm visualized.  Other findings: A small amount of free fluid is noted about the spleen. A small left pleural effusion is evident as well.  IMPRESSION: 1. Cholelithiasis without evidence for cholecystitis. 2. A small amount of free fluid is noted about the spleen. 3. Small left pleural effusion. 4. Right parapelvic renal cyst measures 1.5 cm maximally.   Electronically Signed   By: Lawrence Santiago M.D.   On: 03/27/2014 12:01   Ct Abdomen Pelvis W Contrast  03/26/2014   CLINICAL DATA:  Lower abdominal pain involving both lower quadrants and constipation over the past 2 weeks. Chronic ventilator dependent respiratory failure.  EXAM: CT ABDOMEN AND PELVIS WITH CONTRAST  TECHNIQUE: Multidetector CT imaging of the abdomen and pelvis was performed using the standard protocol following bolus  administration of intravenous contrast.  CONTRAST:  41mL OMNIPAQUE IOHEXOL 300 MG/ML IV. Oral contrast was also administered.  COMPARISON:  CTA abdomen 05/23/2009. CT abdomen 04/17/2006. No prior CT pelvis.  FINDINGS: Tortuous and redundant sigmoid colon which is predominantly decompressed. Moderate to large stool burden throughout the colon. Sigmoid colon diverticulosis without evidence of acute diverticulitis. Normal-appearing small bowel. Stomach decompressed and unremarkable. No ascites.  Normal-appearing liver, spleen, and adrenal glands. Parapelvic cyst involving the otherwise normal-appearing right kidney. Normal left kidney. Approximate 1.5 cm calcified gallstone within the contracted gallbladder. No pericholecystic inflammation. No biliary ductal dilation. Extensive aortoiliofemoral atherosclerosis without  aneurysm. Bilateral renal artery stents and left common iliac artery stent. Stenoses at the origins of the celiac, superior mesenteric and inferior mesenteric arteries. No significant lymphadenopathy.  Massively enlarged prostate gland measuring approximately 6.5 x 6.9 x 6.0 cm. Mildly distended urinary bladder. Numerous pelvic phleboliths.  Bone window images demonstrate lower thoracic spondylosis, multilevel degenerative disc disease, spondylosis and facet degenerative changes throughout the lumbar spine, and mild degenerative changes involving the sacroiliac joints. Visualized lung bases clear. Heart enlarged with left circumflex and right coronary atherosclerosis and dense mitral annular calcification.  IMPRESSION: 1. No acute abnormalities involving the abdomen or pelvis. 2. Moderate to large stool burden throughout the tortuous and redundant colon. Sigmoid diverticulosis without evidence of acute diverticulitis. 3. Marked prostate gland enlargement, measurements given above. 4. Cholelithiasis without CT evidence of acute cholecystitis.   Electronically Signed   By: Evangeline Dakin M.D.   On:  03/26/2014 20:28   Dg Chest Portable 1 View  03/17/2014   CLINICAL DATA:  Chest pain and congestion.  EXAM: PORTABLE CHEST - 1 VIEW  COMPARISON:  02/12/2014.  FINDINGS: 1048 hrs. There is persistent bibasilar interstitial airspace opacity with probable small left pleural effusion. Cardiopericardial silhouette is at upper limits of normal for size. Imaged bony structures of the thorax are intact. Telemetry leads overlie the chest.  IMPRESSION: Stable. Bibasilar interstitial and alveolar opacities may be related to infection although dependent edema could have this appearance.   Electronically Signed   By: Misty Stanley M.D.   On: 03/17/2014 11:13    Microbiology: No results found for this or any previous visit (from the past 240 hour(s)).   Labs: Basic Metabolic Panel:  Recent Labs Lab 03/26/14 1728 03/27/14 0103 03/28/14 0332  NA 139 138 136*  K 4.1 3.5* 3.6*  CL 99 100 97  CO2 20 25 25   GLUCOSE 52* 90 125*  BUN 37* 31* 22  CREATININE 1.18 1.17 1.10  CALCIUM 8.6 7.4* 7.6*   Liver Function Tests:  Recent Labs Lab 03/26/14 1728 03/27/14 0103  AST 14 12  ALT 14 12  ALKPHOS 62 55  BILITOT 0.7 0.5  PROT 6.2 5.5*  ALBUMIN 3.4* 2.8*    Recent Labs Lab 03/26/14 1728 03/27/14 0103 03/28/14 0332 03/29/14 0410  LIPASE 225* 71* 111* 47   No results found for this basename: AMMONIA,  in the last 168 hours CBC:  Recent Labs Lab 03/26/14 1728 03/27/14 0103  WBC 9.5 7.3  NEUTROABS 7.9*  --   HGB 12.9* 11.4*  HCT 37.2* 33.1*  MCV 91.0 91.2  PLT 333 267   Cardiac Enzymes: No results found for this basename: CKTOTAL, CKMB, CKMBINDEX, TROPONINI,  in the last 168 hours BNP: BNP (last 3 results)  Recent Labs  02/12/14 0728 03/17/14 1030 03/18/14 0320  PROBNP 9440.0* 5030.0* 7135.0*   CBG:  Recent Labs Lab 03/28/14 1101 03/28/14 1630 03/28/14 2151 03/29/14 0633 03/29/14 1116  GLUCAP 228* 238* 97 177* 246*       Signed:  Kiondra Caicedo  [facility] 03/29/2014, 12:57 PM

## 2014-03-29 NOTE — Care Management Note (Signed)
    Page 1 of 1   03/29/2014     2:38:34 PM CARE MANAGEMENT NOTE 03/29/2014  Patient:  [name], [name]   Account Number:  [id number]  Date Initiated:  03/29/2014  Documentation initiated by:  [name]  Subjective/Objective Assessment:   80 y.o. male with PMH of CAD s/p CABG, ICM EF of 25%, DM-II, remote h/o throat CA/trach, h/o RAS s/p stents, AFib on Apixaban, who present with abdominal pain.//Home with spouse.     Action/Plan:   will admit to tele bed  - IVF: will not give normal IVF given his EF 25 to 30%.  - will hold Lasix tonight  - NPO//Access for disposition needs   Anticipated DC Date:  03/29/2014   Anticipated DC Plan:  [name]  CM consult      Choice offered to / List presented to:  [name]        [place] arranged  [place]   Status of service:  Completed, signed off Medicare Important Message given?  YES (If response is "NO", the following Medicare IM given date fields will be blank) Date Medicare IM given:  03/29/2014 Medicare IM given by:  [facility] Date Additional Medicare IM given:   Additional Medicare IM given by:    Discharge Disposition:    Per UR Regulation:    If discussed at Long Length of Stay Meetings, dates discussed:    Comments:  03/29/14 1100 [name] J. [name] Laming, RN, BSN, [employer] [phone] Spoke with pt at bedside regarding discharge planning for [name]. Offered pt list of home health agencies to choose from.  Pt chose [name] to render services. [name] of [facility] notified.  No DME needs identified at this time.

## 2014-03-29 NOTE — Discharge Instructions (Signed)

## 2014-03-29 NOTE — Progress Notes (Signed)
Patient is currently active with [facility] for chronic disease management services.  Patient is pending for engagement by a RN [facility].  Our community based plan of care has focused on disease management, medication procurement/adherence, and community resource support.  Patient will receive a post discharge transition of care call and will be evaluated for monthly home visits for assessments and disease process education.  Made Inpatient Case Manager aware that [facility] following. Of note, [facility] Care Management services does not replace or interfere with any services that are arranged by inpatient case management or social work.  For additional questions or referrals please contact Corliss Blacker BSN RN [facility] Liaison at [phone].

## 2014-03-29 NOTE — Progress Notes (Signed)
At 1518 all d/c instructions explained and given to pt.  Verbalized understanding.  Pt d/c off floor via w/c by volunteer services.  Escorted by son.  Amareon Phung, Therapist, sports.

## 2014-04-01 ENCOUNTER — Ambulatory Visit (INDEPENDENT_AMBULATORY_CARE_PROVIDER_SITE_OTHER): Payer: Medicare Other | Admitting: Interventional Cardiology

## 2014-04-01 ENCOUNTER — Telehealth: Payer: Self-pay | Admitting: Cardiology

## 2014-04-01 ENCOUNTER — Encounter: Payer: Self-pay | Admitting: Interventional Cardiology

## 2014-04-01 VITALS — BP 110/52 | HR 67 | Ht 72.0 in | Wt 135.0 lb

## 2014-04-01 DIAGNOSIS — I10 Essential (primary) hypertension: Secondary | ICD-10-CM

## 2014-04-01 DIAGNOSIS — I25708 Atherosclerosis of coronary artery bypass graft(s), unspecified, with other forms of angina pectoris: Secondary | ICD-10-CM

## 2014-04-01 DIAGNOSIS — E782 Mixed hyperlipidemia: Secondary | ICD-10-CM

## 2014-04-01 DIAGNOSIS — I429 Cardiomyopathy, unspecified: Secondary | ICD-10-CM

## 2014-04-01 DIAGNOSIS — I213 ST elevation (STEMI) myocardial infarction of unspecified site: Secondary | ICD-10-CM

## 2014-04-01 NOTE — Telephone Encounter (Signed)
Continue 5 mg BID for at least a month post cardioversion.  OK to drop to 2.5 mg BID after that time if he notices any bruising or bleeding, or if his weight drops below 60 kg.

## 2014-04-01 NOTE — Telephone Encounter (Signed)
Pt came to the office today and he is currently taking Eliquis 2.5 mg 1 tablet twice a day. Pt states he used to take 5 mg BID. Can you verify which dosage is correct?

## 2014-04-01 NOTE — Telephone Encounter (Signed)
Pt meets criteria for 5mg  BID but weight is boarderline (~61kg).  Would watch carefully.  If he notices any excessive bruising or bleeding may need to decrease to 2.5mg  BID.

## 2014-04-01 NOTE — Progress Notes (Signed)
Patient ID: [name], male   DOB: 02/19/1923, 80 y.o.   MRN: [id number] Patient ID: [name], male   DOB: [birth year], 80 y.o.   MRN: [id number]    [place], [place], [place]  [address] Phone: [phone] Fax:  [phone]  Date:  04/01/2014   ID:  [name], DOB 01-22-1923, MRN [id number]  PCP:  Mathews Argyle, MD      History of Present Illness: [name] is a 80 y.o. male with CAD who most recently had a NSTEMI in 8/14. He had a stent to the SVG to OM. He has returned to walking. He was recently hospitalized for pancreatitis.  He lost some weight. CAD/ASCVD:  Denies : Chest pain.  Diaphoresis.  Dizziness.  Dyspnea on exertion.  Fatigue.  Leg edema.  Nitroglycerin.  Orthopnea.  Palpitations.  Syncope.     Wt Readings from Last 3 Encounters:  04/01/14 195 lb 1.9 oz (88.506 kg)  03/29/14 129 lb 6.4 oz (58.695 kg)  03/19/14 136 lb 5.7 oz (61.85 kg)     Past Medical History  Diagnosis Date  . Coronary artery disease     s/p CABG 1996 then PCI of SVG to OM 2007  . Diverticulosis   . Dyslipidemia   . Renal artery stenosis     s/p bilateral stents  . Hiatal hernia   . Colon polyps   . Carotid artery stenosis     50% BILATERAL 2014  . Peripheral neuropathy   . Anemia   . Hypothyroidism   . Nasal polyps   . Hypertension   . Diabetes mellitus   . Throat cancer S/P TRACHECTOMY   . Myocardial infarction   . Peripheral vascular disease     LOWER EXT  . Atrial fibrillation/flutter Aug 2015  . GERD (gastroesophageal reflux disease)   . Shortness of breath     Hx: of  . HOH (hard of hearing) VERY   . Left bundle branch block (LBBB) on electrocardiogram 03/17/2014  . Ischemic cardiomyopathy Aug 2015    EF 25-30%   . History of throat cancer     s/p trach  . Aortic stenosis Aug 2015    mild to moderate  . Chronic anticoagulation Aug 2015    Eliquis  . CHF (congestive heart failure)     Current Outpatient Prescriptions    Medication Sig Dispense Refill  . acarbose (PRECOSE) 100 MG tablet Take 100 mg by mouth 3 (three) times daily.      Marland Kitchen acetaminophen (TYLENOL) 500 MG tablet Take 500 mg by mouth every 6 (six) hours as needed for headache.      Marland Kitchen amiodarone (PACERONE) 400 MG tablet Take 200 mg by mouth daily.      Marland Kitchen apixaban (ELIQUIS) 5 MG TABS tablet Take 2.5 mg by mouth 2 (two) times daily.      Marland Kitchen aspirin EC 81 MG tablet Take 81 mg by mouth 2 (two) times a week. Mondays and thursdays      . atorvastatin (LIPITOR) 10 MG tablet Take 10 mg by mouth daily.      . carvedilol (COREG) 6.25 MG tablet Take 6.25 mg by mouth 2 (two) times daily with a meal.      . citalopram (CELEXA) 10 MG tablet Take 10 mg by mouth daily.      . ferrous sulfate 325 (65 FE) MG tablet Take 325 mg by mouth daily with breakfast.      . finasteride (PROSCAR) 5  MG tablet Take 5 mg by mouth daily.       . furosemide (LASIX) 20 MG tablet Take 20 mg by mouth daily.      Marland Kitchen glipiZIDE (GLUCOTROL) 10 MG tablet Take 20 mg by mouth 2 (two) times daily before a meal.       . levothyroxine (SYNTHROID, LEVOTHROID) 75 MCG tablet Take 75 mcg by mouth daily before breakfast.      . lisinopril (PRINIVIL,ZESTRIL) 5 MG tablet Take 5 mg by mouth daily.      . metFORMIN (GLUCOPHAGE) 500 MG tablet Take 500 mg by mouth daily.      . polyethylene glycol (MIRALAX / GLYCOLAX) packet Take 17 g by mouth daily as needed for mild constipation.      . potassium chloride SA (K-DUR,KLOR-CON) 20 MEQ tablet Take 20 mEq by mouth 2 (two) times daily.      Marland Kitchen senna-docusate (SENOKOT-S) 8.6-50 MG per tablet Take 1 tablet by mouth at bedtime as needed for mild constipation.  20 tablet  0  . [DISCONTINUED] enalapril (VASOTEC) 20 MG tablet Take 20 mg by mouth daily.       . [DISCONTINUED] glimepiride (AMARYL) 4 MG tablet Take 4 mg by mouth 2 (two) times daily before a meal.        . [DISCONTINUED] simvastatin (ZOCOR) 40 MG tablet Take 40 mg by mouth daily.         No current  facility-administered medications for this visit.    Allergies:   No Known Allergies  Social History:  The patient  reports that he quit smoking about 67 years ago. His smoking use included Cigarettes. He smoked 0.30 packs per day. He has never used smokeless tobacco. He reports that he does not drink alcohol or use illicit drugs.   Family History:  The patient's family history includes Diabetes in his son; Heart attack in his brother; Hypertension in his mother.   ROS:  Please see the history of present illness.  No nausea, vomiting.  No fevers, chills.  No focal weakness.  No dysuria.    All other systems reviewed and negative.   PHYSICAL EXAM: VS:  BP 110/52  Pulse 67  Ht 6' (1.829 m)  Wt 195 lb 1.9 oz (88.506 kg)  BMI 26.46 kg/m2 Well nourished, well developed, in no acute distress HEENT: tracheostomy Neck: no JVD, bilateral carotid bruits Cardiac:  normal S1, S2; RRR;  Lungs:  clear to auscultation bilaterally, no wheezing, rhonchi or rales Abd: soft, nontender, no hepatomegaly Ext: no edema Skin: warm and dry Neuro:   no focal abnormalities noted      ASSESSMENT AND PLAN:  Coronary atherosclerosis of native coronary artery  Continue Aspirin Tablet Chewable, 81 MG, 1 tablet, Orally, Once a day Continue Plavix Tablet, 75 MG, 1 tablet, Orally, Once a day Notes: s/p CABG. Cath report reviewed. continue dual antiplatelet therapy for at least a year without interruption. No exertional sx.  No CHF sx; he has had 2 MI in the past 7 years.  PCI of SVG in 2007 ( no reflow), then same graft in 2014.  No angina.    2. Hypertension, essential  Continue Amlodipine Besylate Tablet, 10 MG, 1 tablet, Orally, Once a day Continue Lisinopril-Hydrochlorothiazide Tablet, 20-12.5 MG, 2 tablets, Orally, Once a day  BP controlled today.  No sx of lightheadedness.   If he gets lightheadedness, would decrease amlodipine.   3. Hyperlipidemia: LDL 81, HDL 42 in 9/14.  Continue atorvastatin.  LDL  increased to 106 in 3/15. Consider repeat lipid at next vsit with PMD.   4. Carotid artery disease: Bilateral moderate disease; follow with carotid duplex.  5. Cardiomyopathy: EF reduced.  No signs of CHF.  Recent cardioversion from AFib to NSR. Eliquis for stroke prevention.   Preventive Medicine  Adult topics discussed:  Exercise: 5 days a week, at least 30 minutes of aerobic exercise.      Signed, Mina Marble, MD, [facility] 04/01/2014 10:03 AM

## 2014-04-01 NOTE — Patient Instructions (Signed)
Your physician recommends that you continue on your current medications as directed. Please refer to the Current Medication list given to you today.  Your physician wants you to follow-up in: 6 months with Dr. Varanasi.  You will receive a reminder letter in the mail two months in advance. If you don't receive a letter, please call our office to schedule the follow-up appointment.  

## 2014-04-01 NOTE — ED Provider Notes (Signed)
CSN: [id number]     Arrival date & time 03/26/14  1712 History   First MD Initiated Contact with Patient 03/26/14 1743     Chief Complaint  Patient presents with  . Abdominal Pain     (Consider location/radiation/quality/duration/timing/severity/associated sxs/prior Treatment) HPI Patient presents to the emergency department with constipation, and abdominal pain for 2 weeks.  The patient denies nausea, and vomiting.  Patient, states he has had some hard bowel movements.  Patient does have a trach in place.  Patient, states, that he has not take any medications prior to arrival.  The patient denies chest pain, shortness of breath, weakness, dizziness, headache, blurred vision, back pain, neck pain, rash, or syncope.  The patient, states, that nothing seems to make his condition, better or worse Past Medical History  Diagnosis Date  . Coronary artery disease     s/p CABG 1996 then PCI of SVG to OM 2007  . Diverticulosis   . Dyslipidemia   . Renal artery stenosis     s/p bilateral stents  . Hiatal hernia   . Colon polyps   . Carotid artery stenosis     50% BILATERAL 2014  . Peripheral neuropathy   . Anemia   . Hypothyroidism   . Nasal polyps   . Hypertension   . Diabetes mellitus   . Throat cancer S/P TRACHECTOMY   . Myocardial infarction   . Peripheral vascular disease     LOWER EXT  . Atrial fibrillation/flutter Aug 2015  . GERD (gastroesophageal reflux disease)   . Shortness of breath     Hx: of  . HOH (hard of hearing) VERY   . Left bundle branch block (LBBB) on electrocardiogram 03/17/2014  . Ischemic cardiomyopathy Aug 2015    EF 25-30%   . History of throat cancer     s/p trach  . Aortic stenosis Aug 2015    mild to moderate  . Chronic anticoagulation Aug 2015    Eliquis  . CHF (congestive heart failure)    Past Surgical History  Procedure Laterality Date  . Coronary artery bypass graft      1996  . Cardiac catheterization      PCI of SVG to OM 2007  . Renal  artery stent    . Tracheostomy    . Appendectomy    . Tonsillectomy    . Sp pta peripheral  March 2013    Lt SFA (Dr Saundra Shelling)  . Sp pta peripheral  May 2013    Rt SFA (Dr Bridgett Larsson)   Family History  Problem Relation Age of Onset  . Hypertension Mother   . Heart attack Brother   . Diabetes Son    History  Substance Use Topics  . Smoking status: Former Smoker -- 0.30 packs/day    Types: Cigarettes    Quit date: 06/18/1946  . Smokeless tobacco: Never Used  . Alcohol Use: No    Review of Systems All other systems negative except as documented in the HPI. All pertinent positives and negatives as reviewed in the HPI.    Allergies  Review of patient's allergies indicates no known allergies.  Home Medications   Prior to Admission medications   Medication Sig Start Date End Date Taking? Authorizing Provider  acarbose (PRECOSE) 100 MG tablet Take 100 mg by mouth 3 (three) times daily. 02/20/14  Yes Historical Provider, MD  acetaminophen (TYLENOL) 500 MG tablet Take 500 mg by mouth every 6 (six) hours as needed for headache.  Yes Historical Provider, MD  amiodarone (PACERONE) 400 MG tablet Take 200 mg by mouth daily.   Yes Historical Provider, MD  aspirin EC 81 MG tablet Take 81 mg by mouth 2 (two) times a week. Mondays and thursdays   Yes Historical Provider, MD  atorvastatin (LIPITOR) 10 MG tablet Take 10 mg by mouth daily.   Yes Historical Provider, MD  carvedilol (COREG) 6.25 MG tablet Take 6.25 mg by mouth 2 (two) times daily with a meal.   Yes Historical Provider, MD  citalopram (CELEXA) 10 MG tablet Take 10 mg by mouth daily.   Yes Historical Provider, MD  ferrous sulfate 325 (65 FE) MG tablet Take 325 mg by mouth daily with breakfast.   Yes Historical Provider, MD  finasteride (PROSCAR) 5 MG tablet Take 5 mg by mouth daily.    Yes Historical Provider, MD  furosemide (LASIX) 20 MG tablet Take 20 mg by mouth daily.   Yes Historical Provider, MD  glipiZIDE (GLUCOTROL) 10 MG tablet Take  20 mg by mouth 2 (two) times daily before a meal.    Yes Historical Provider, MD  levothyroxine (SYNTHROID, LEVOTHROID) 75 MCG tablet Take 75 mcg by mouth daily before breakfast.   Yes Historical Provider, MD  lisinopril (PRINIVIL,ZESTRIL) 5 MG tablet Take 5 mg by mouth daily.   Yes Historical Provider, MD  polyethylene glycol (MIRALAX / GLYCOLAX) packet Take 17 g by mouth daily as needed for mild constipation.   Yes Historical Provider, MD  potassium chloride SA (K-DUR,KLOR-CON) 20 MEQ tablet Take 20 mEq by mouth 2 (two) times daily.   Yes Historical Provider, MD  apixaban (ELIQUIS) 2.5 MG TABS tablet Take 2.5 mg by mouth 2 (two) times daily.    Historical Provider, MD  metFORMIN (GLUCOPHAGE) 500 MG tablet Take 500 mg by mouth daily. 03/12/14   Historical Provider, MD  senna-docusate (SENOKOT-S) 8.6-50 MG per tablet Take 1 tablet by mouth at bedtime as needed for mild constipation. 03/29/14   Nishant Dhungel, MD   BP 106/48  Pulse 69  Temp(Src) 97 F (36.1 C) (Oral)  Resp 20  Ht 6' (1.829 m)  Wt 129 lb 6.4 oz (58.695 kg)  BMI 17.55 kg/m2  SpO2 100% Physical Exam  Nursing note and vitals reviewed. Constitutional: He is oriented to person, place, and time. He appears well-developed and well-nourished. No distress.  HENT:  Head: Normocephalic and atraumatic.  Mouth/Throat: Oropharynx is clear and moist.  Eyes: Pupils are equal, round, and reactive to light.  Neck: Normal range of motion. Neck supple.  Cardiovascular: Normal rate, regular rhythm and normal heart sounds.  Exam reveals no gallop and no friction rub.   No murmur heard. Pulmonary/Chest: Effort normal and breath sounds normal. No respiratory distress.  Abdominal: Soft. Bowel sounds are normal. He exhibits no distension. There is tenderness. There is no rebound and no guarding.  Musculoskeletal: He exhibits no edema.  Neurological: He is alert and oriented to person, place, and time. He exhibits normal muscle tone. Coordination  normal.  Skin: Skin is warm and dry. No rash noted. No erythema.    ED Course  Procedures (including critical care time) Labs Review Labs Reviewed  CBC WITH DIFFERENTIAL - Abnormal; Notable for the following:    RBC 4.09 (*)    Hemoglobin 12.9 (*)    HCT 37.2 (*)    Neutrophils Relative % 83 (*)    Neutro Abs 7.9 (*)    Lymphocytes Relative 9 (*)    All other components  within normal limits  COMPREHENSIVE METABOLIC PANEL - Abnormal; Notable for the following:    Glucose, Bld 52 (*)    BUN 37 (*)    Albumin 3.4 (*)    GFR calc non Af Amer 53 (*)    GFR calc Af Amer 61 (*)    Anion gap 20 (*)    All other components within normal limits  LIPASE, BLOOD - Abnormal; Notable for the following:    Lipase 225 (*)    All other components within normal limits  COMPREHENSIVE METABOLIC PANEL - Abnormal; Notable for the following:    Potassium 3.5 (*)    BUN 31 (*)    Calcium 7.4 (*)    Total Protein 5.5 (*)    Albumin 2.8 (*)    GFR calc non Af Amer 53 (*)    GFR calc Af Amer 62 (*)    All other components within normal limits  CBC - Abnormal; Notable for the following:    RBC 3.63 (*)    Hemoglobin 11.4 (*)    HCT 33.1 (*)    All other components within normal limits  LACTIC ACID, PLASMA - Abnormal; Notable for the following:    Lactic Acid, Venous 2.3 (*)    All other components within normal limits  LIPASE, BLOOD - Abnormal; Notable for the following:    Lipase 71 (*)    All other components within normal limits  GLUCOSE, CAPILLARY - Abnormal; Notable for the following:    Glucose-Capillary 42 (*)    All other components within normal limits  GLUCOSE, CAPILLARY - Abnormal; Notable for the following:    Glucose-Capillary 120 (*)    All other components within normal limits  GLUCOSE, CAPILLARY - Abnormal; Notable for the following:    Glucose-Capillary 50 (*)    All other components within normal limits  GLUCOSE, CAPILLARY - Abnormal; Notable for the following:     Glucose-Capillary 117 (*)    All other components within normal limits  GLUCOSE, CAPILLARY - Abnormal; Notable for the following:    Glucose-Capillary 129 (*)    All other components within normal limits  LIPASE, BLOOD - Abnormal; Notable for the following:    Lipase 111 (*)    All other components within normal limits  BASIC METABOLIC PANEL - Abnormal; Notable for the following:    Sodium 136 (*)    Potassium 3.6 (*)    Glucose, Bld 125 (*)    Calcium 7.6 (*)    GFR calc non Af Amer 57 (*)    GFR calc Af Amer 67 (*)    All other components within normal limits  GLUCOSE, CAPILLARY - Abnormal; Notable for the following:    Glucose-Capillary 145 (*)    All other components within normal limits  GLUCOSE, CAPILLARY - Abnormal; Notable for the following:    Glucose-Capillary 143 (*)    All other components within normal limits  GLUCOSE, CAPILLARY - Abnormal; Notable for the following:    Glucose-Capillary 228 (*)    All other components within normal limits  GLUCOSE, CAPILLARY - Abnormal; Notable for the following:    Glucose-Capillary 238 (*)    All other components within normal limits  GLUCOSE, CAPILLARY - Abnormal; Notable for the following:    Glucose-Capillary 177 (*)    All other components within normal limits  GLUCOSE, CAPILLARY - Abnormal; Notable for the following:    Glucose-Capillary 120 (*)    All other components within normal limits  GLUCOSE,  CAPILLARY - Abnormal; Notable for the following:    Glucose-Capillary 246 (*)    All other components within normal limits  I-STAT CG4 LACTIC ACID, ED - Abnormal; Notable for the following:    Lactic Acid, Venous 6.00 (*)    All other components within normal limits  URINALYSIS, ROUTINE W REFLEX MICROSCOPIC  LIPASE, BLOOD  GLUCOSE, CAPILLARY  LACTIC ACID, PLASMA  POC OCCULT BLOOD, ED  CBG MONITORING, ED   Patient be admitted to the hospital for further evaluation and care.       Brent General, PA-C 04/02/14  [phone]

## 2014-04-05 NOTE — ED Provider Notes (Signed)
Medical screening examination/treatment/procedure(s) were conducted as a shared visit with non-physician practitioner(s) and myself.  I personally evaluated the patient during the encounter.   EKG Interpretation None       Orlie Dakin, MD 04/05/14 1450

## 2014-04-06 ENCOUNTER — Emergency Department (HOSPITAL_COMMUNITY)
Admission: EM | Admit: 2014-04-06 | Discharge: 2014-04-06 | Disposition: A | Discharge status: Home / Self Care | Payer: Medicare Other | Attending: Emergency Medicine | Admitting: Emergency Medicine

## 2014-04-06 ENCOUNTER — Emergency Department (HOSPITAL_COMMUNITY): Payer: Medicare Other

## 2014-04-06 ENCOUNTER — Encounter (HOSPITAL_COMMUNITY): Payer: Self-pay | Admitting: Emergency Medicine

## 2014-04-06 DIAGNOSIS — K5900 Constipation, unspecified: Secondary | ICD-10-CM | POA: Diagnosis present

## 2014-04-06 DIAGNOSIS — Z86010 Personal history of colonic polyps: Secondary | ICD-10-CM | POA: Insufficient documentation

## 2014-04-06 DIAGNOSIS — Z85818 Personal history of malignant neoplasm of other sites of lip, oral cavity, and pharynx: Secondary | ICD-10-CM | POA: Diagnosis not present

## 2014-04-06 DIAGNOSIS — E119 Type 2 diabetes mellitus without complications: Secondary | ICD-10-CM | POA: Insufficient documentation

## 2014-04-06 DIAGNOSIS — I10 Essential (primary) hypertension: Secondary | ICD-10-CM | POA: Insufficient documentation

## 2014-04-06 DIAGNOSIS — D649 Anemia, unspecified: Secondary | ICD-10-CM | POA: Insufficient documentation

## 2014-04-06 DIAGNOSIS — Z951 Presence of aortocoronary bypass graft: Secondary | ICD-10-CM | POA: Diagnosis not present

## 2014-04-06 DIAGNOSIS — Z87891 Personal history of nicotine dependence: Secondary | ICD-10-CM | POA: Insufficient documentation

## 2014-04-06 DIAGNOSIS — F419 Anxiety disorder, unspecified: Secondary | ICD-10-CM | POA: Insufficient documentation

## 2014-04-06 DIAGNOSIS — Z8719 Personal history of other diseases of the digestive system: Secondary | ICD-10-CM | POA: Insufficient documentation

## 2014-04-06 DIAGNOSIS — Z79899 Other long term (current) drug therapy: Secondary | ICD-10-CM | POA: Insufficient documentation

## 2014-04-06 DIAGNOSIS — Z8669 Personal history of other diseases of the nervous system and sense organs: Secondary | ICD-10-CM | POA: Insufficient documentation

## 2014-04-06 DIAGNOSIS — I509 Heart failure, unspecified: Secondary | ICD-10-CM | POA: Diagnosis not present

## 2014-04-06 DIAGNOSIS — I4891 Unspecified atrial fibrillation: Secondary | ICD-10-CM | POA: Insufficient documentation

## 2014-04-06 DIAGNOSIS — Z7982 Long term (current) use of aspirin: Secondary | ICD-10-CM | POA: Insufficient documentation

## 2014-04-06 DIAGNOSIS — E039 Hypothyroidism, unspecified: Secondary | ICD-10-CM | POA: Diagnosis not present

## 2014-04-06 DIAGNOSIS — I2510 Atherosclerotic heart disease of native coronary artery without angina pectoris: Secondary | ICD-10-CM | POA: Insufficient documentation

## 2014-04-06 DIAGNOSIS — I252 Old myocardial infarction: Secondary | ICD-10-CM | POA: Diagnosis not present

## 2014-04-06 DIAGNOSIS — Z7902 Long term (current) use of antithrombotics/antiplatelets: Secondary | ICD-10-CM | POA: Diagnosis not present

## 2014-04-06 DIAGNOSIS — Z7901 Long term (current) use of anticoagulants: Secondary | ICD-10-CM | POA: Insufficient documentation

## 2014-04-06 LAB — COMPREHENSIVE METABOLIC PANEL
ALT: 13 U/L (ref 0–53)
AST: 14 U/L (ref 0–37)
Albumin: 3.1 g/dL — ABNORMAL LOW (ref 3.5–5.2)
Alkaline Phosphatase: 56 U/L (ref 39–117)
Anion gap: 14 (ref 5–15)
BILIRUBIN TOTAL: 0.3 mg/dL (ref 0.3–1.2)
BUN: 21 mg/dL (ref 6–23)
CHLORIDE: 99 meq/L (ref 96–112)
CO2: 25 mEq/L (ref 19–32)
CREATININE: 1.08 mg/dL (ref 0.50–1.35)
Calcium: 7.9 mg/dL — ABNORMAL LOW (ref 8.4–10.5)
GFR calc Af Amer: 68 mL/min — ABNORMAL LOW (ref >90)
GFR calc non Af Amer: 59 mL/min — ABNORMAL LOW (ref >90)
Glucose, Bld: 222 mg/dL — ABNORMAL HIGH (ref 70–99)
POTASSIUM: 4 meq/L (ref 3.7–5.3)
SODIUM: 138 meq/L (ref 137–147)
Total Protein: 5.9 g/dL — ABNORMAL LOW (ref 6.0–8.3)

## 2014-04-06 LAB — CBG MONITORING, ED: Glucose-Capillary: 187 mg/dL — ABNORMAL HIGH (ref 70–99)

## 2014-04-06 LAB — CBC WITH DIFFERENTIAL/PLATELET
BASOS ABS: 0 10*3/uL (ref 0.0–0.1)
Basophils Relative: 0 % (ref 0–1)
Eosinophils Absolute: 0 10*3/uL (ref 0.0–0.7)
Eosinophils Relative: 1 % (ref 0–5)
HEMATOCRIT: 33.2 % — AB (ref 39.0–52.0)
Hemoglobin: 11.1 g/dL — ABNORMAL LOW (ref 13.0–17.0)
LYMPHS PCT: 18 % (ref 12–46)
Lymphs Abs: 0.8 10*3/uL (ref 0.7–4.0)
MCH: 31 pg (ref 26.0–34.0)
MCHC: 33.4 g/dL (ref 30.0–36.0)
MCV: 92.7 fL (ref 78.0–100.0)
MONO ABS: 0.4 10*3/uL (ref 0.1–1.0)
Monocytes Relative: 9 % (ref 3–12)
NEUTROS PCT: 72 % (ref 43–77)
Neutro Abs: 3.4 10*3/uL (ref 1.7–7.7)
Platelets: 235 10*3/uL (ref 150–400)
RBC: 3.58 MIL/uL — ABNORMAL LOW (ref 4.22–5.81)
RDW: 14.1 % (ref 11.5–15.5)
WBC: 4.6 10*3/uL (ref 4.0–10.5)

## 2014-04-06 LAB — URINALYSIS, ROUTINE W REFLEX MICROSCOPIC
BILIRUBIN URINE: NEGATIVE
Glucose, UA: 500 mg/dL — AB
HGB URINE DIPSTICK: NEGATIVE
Ketones, ur: NEGATIVE mg/dL
Leukocytes, UA: NEGATIVE
Nitrite: NEGATIVE
Protein, ur: NEGATIVE mg/dL
SPECIFIC GRAVITY, URINE: 1.019 (ref 1.005–1.030)
Urobilinogen, UA: 1 mg/dL (ref 0.0–1.0)
pH: 5.5 (ref 5.0–8.0)

## 2014-04-06 LAB — I-STAT TROPONIN, ED: Troponin i, poc: 0.01 ng/mL (ref 0.00–0.08)

## 2014-04-06 NOTE — ED Provider Notes (Signed)
CSN: [id number]     Arrival date & time 04/06/14  1714 History   First MD Initiated Contact with Patient 04/06/14 1717     Chief Complaint  Patient presents with  . Constipation     (Consider location/radiation/quality/duration/timing/severity/associated sxs/prior Treatment) HPI  Mr. Spikes is an 80 year old male with past medical history of CAD, hypertension, diabetes, MI who presents the ER today after an episode of anxiousness. Patient reports that he was sitting at home alone, when he states that he became scared, and very anxious. Patient's son in the room states that his wife went over to their house, and noted patient to be shaky and pale, and called 911. Patient states this episode of anxiety lasted approximately 30 minutes, and resolve on its own. Patient states he has been asymptomatic since the ambulance arrived at her house, and remains asymptomatic in the ER. Patient's son reports that patient has recently been admitted to the hospital for constipation, and pancreatitis. Patient reports since leaving the hospital he has had no problems with his bowel movements, and is taking senna on a daily basis. Patient states last bowel movement was today, he states it was a normal consistency and amount. Patient denies any associated chest pain, dizziness, weakness, blurred vision, shortness of breath, abdominal pain, nausea, vomiting, diarrhea, dysuria.  Past Medical History  Diagnosis Date  . Coronary artery disease     s/p CABG 1996 then PCI of SVG to OM 2007  . Diverticulosis   . Dyslipidemia   . Renal artery stenosis     s/p bilateral stents  . Hiatal hernia   . Colon polyps   . Carotid artery stenosis     50% BILATERAL 2014  . Peripheral neuropathy   . Anemia   . Hypothyroidism   . Nasal polyps   . Hypertension   . Diabetes mellitus   . Throat cancer S/P TRACHECTOMY   . Myocardial infarction   . Peripheral vascular disease     LOWER EXT  . Atrial fibrillation/flutter Aug  2015  . GERD (gastroesophageal reflux disease)   . Shortness of breath     Hx: of  . HOH (hard of hearing) VERY   . Left bundle branch block (LBBB) on electrocardiogram 03/17/2014  . Ischemic cardiomyopathy Aug 2015    EF 25-30%   . History of throat cancer     s/p trach  . Aortic stenosis Aug 2015    mild to moderate  . Chronic anticoagulation Aug 2015    Eliquis  . CHF (congestive heart failure)    Past Surgical History  Procedure Laterality Date  . Coronary artery bypass graft      1996  . Cardiac catheterization      PCI of SVG to OM 2007  . Renal artery stent    . Tracheostomy    . Appendectomy    . Tonsillectomy    . Sp pta peripheral  March 2013    Lt SFA (Dr Saundra Shelling)  . Sp pta peripheral  May 2013    Rt SFA (Dr Bridgett Larsson)   Family History  Problem Relation Age of Onset  . Hypertension Mother   . Heart attack Brother   . Diabetes Son    History  Substance Use Topics  . Smoking status: Former Smoker -- 0.30 packs/day    Types: Cigarettes    Quit date: 06/18/1946  . Smokeless tobacco: Never Used  . Alcohol Use: No    Review of Systems  Constitutional: Negative for  fever.  HENT: Negative for trouble swallowing.   Eyes: Negative for visual disturbance.  Respiratory: Negative for shortness of breath.   Cardiovascular: Negative for chest pain.  Gastrointestinal: Negative for nausea, vomiting and abdominal pain.  Genitourinary: Negative for dysuria.  Musculoskeletal: Negative for neck pain.  Skin: Negative for rash.  Neurological: Negative for dizziness, weakness and numbness.  Psychiatric/Behavioral: The patient is nervous/anxious.       Allergies  Review of patient's allergies indicates no known allergies.  Home Medications   Prior to Admission medications   Medication Sig Start Date End Date Taking? Authorizing Provider  acarbose (PRECOSE) 100 MG tablet Take 100 mg by mouth 3 (three) times daily. 02/20/14   Historical Provider, MD  acetaminophen (TYLENOL)  500 MG tablet Take 500 mg by mouth every 6 (six) hours as needed for headache.    Historical Provider, MD  amiodarone (PACERONE) 400 MG tablet Take 200 mg by mouth daily.    Historical Provider, MD  apixaban (ELIQUIS) 2.5 MG TABS tablet Take 2.5 mg by mouth 2 (two) times daily.    Historical Provider, MD  aspirin EC 81 MG tablet Take 81 mg by mouth 2 (two) times a week. Mondays and thursdays    Historical Provider, MD  atorvastatin (LIPITOR) 10 MG tablet Take 10 mg by mouth daily.    Historical Provider, MD  carvedilol (COREG) 6.25 MG tablet Take 6.25 mg by mouth 2 (two) times daily with a meal.    Historical Provider, MD  citalopram (CELEXA) 10 MG tablet Take 10 mg by mouth daily.    Historical Provider, MD  ferrous sulfate 325 (65 FE) MG tablet Take 325 mg by mouth daily with breakfast.    Historical Provider, MD  finasteride (PROSCAR) 5 MG tablet Take 5 mg by mouth daily.     Historical Provider, MD  furosemide (LASIX) 20 MG tablet Take 20 mg by mouth daily.    Historical Provider, MD  glipiZIDE (GLUCOTROL) 10 MG tablet Take 20 mg by mouth 2 (two) times daily before a meal.     Historical Provider, MD  levothyroxine (SYNTHROID, LEVOTHROID) 75 MCG tablet Take 75 mcg by mouth daily before breakfast.    Historical Provider, MD  lisinopril (PRINIVIL,ZESTRIL) 5 MG tablet Take 5 mg by mouth daily.    Historical Provider, MD  metFORMIN (GLUCOPHAGE) 500 MG tablet Take 500 mg by mouth daily. 03/12/14   Historical Provider, MD  polyethylene glycol (MIRALAX / GLYCOLAX) packet Take 17 g by mouth daily as needed for mild constipation.    Historical Provider, MD  potassium chloride SA (K-DUR,KLOR-CON) 20 MEQ tablet Take 20 mEq by mouth 2 (two) times daily.    Historical Provider, MD  senna-docusate (SENOKOT-S) 8.6-50 MG per tablet Take 1 tablet by mouth at bedtime as needed for mild constipation. 03/29/14   Nishant Dhungel, MD   BP 140/59  Pulse 63  Temp(Src) 97.5 F (36.4 C) (Oral)  Resp 22  Ht 5\' 7"   (1.702 m)  Wt 135 lb (61.236 kg)  BMI 21.14 kg/m2  SpO2 99% Physical Exam  Nursing note and vitals reviewed. Constitutional: He appears well-developed and well-nourished. No distress.  HENT:  Head: Normocephalic and atraumatic.  Mouth/Throat: Oropharynx is clear and moist. No oropharyngeal exudate.  Eyes: Right eye exhibits no discharge. Left eye exhibits no discharge. No scleral icterus.  Neck: Normal range of motion.    Cardiovascular: Normal rate, regular rhythm, S1 normal, S2 normal and normal heart sounds.   No murmur heard.  Pulmonary/Chest: Effort normal. No accessory muscle usage. Not tachypneic. No respiratory distress. He has no wheezes. He has rhonchi in the right upper field and the left upper field.  Abdominal: Soft. Normal appearance and bowel sounds are normal. There is no tenderness.  Musculoskeletal: Normal range of motion. He exhibits no edema and no tenderness.  Neurological: He is alert. No cranial nerve deficit. Coordination normal. GCS eye subscore is 4. GCS verbal subscore is 5. GCS motor subscore is 6.  Patient fully alert answering questions appropriately in broken sentences due to using tracheostomy to speak with.    Skin: Skin is warm and dry. No rash noted. He is not diaphoretic.  Psychiatric: He has a normal mood and affect.    ED Course  Procedures (including critical care time) Labs Review Labs Reviewed  CBC WITH DIFFERENTIAL - Abnormal; Notable for the following:    RBC 3.58 (*)    Hemoglobin 11.1 (*)    HCT 33.2 (*)    All other components within normal limits  COMPREHENSIVE METABOLIC PANEL - Abnormal; Notable for the following:    Glucose, Bld 222 (*)    Calcium 7.9 (*)    Total Protein 5.9 (*)    Albumin 3.1 (*)    GFR calc non Af Amer 59 (*)    GFR calc Af Amer 68 (*)    All other components within normal limits  URINALYSIS, ROUTINE W REFLEX MICROSCOPIC - Abnormal; Notable for the following:    Glucose, UA 500 (*)    All other components  within normal limits  CBG MONITORING, ED - Abnormal; Notable for the following:    Glucose-Capillary 187 (*)    All other components within normal limits  I-STAT TROPOININ, ED    Imaging Review Dg Chest 2 View  04/06/2014   CLINICAL DATA:  80 year old male with acute chest pain and shortness of Breath. Initial encounter.  EXAM: CHEST  2 VIEW  COMPARISON:  02/12/2014 and earlier.  FINDINGS: Stable cardiomegaly and mediastinal contours. Sequelae of CABG. Extensive calcified atherosclerosis of the aorta. Interval resolved pulmonary edema and small pleural effusions. No pneumothorax. No pleural effusion or consolidation. No acute osseous abnormality identified.  IMPRESSION: Resolved pulmonary edema and pleural effusions. No acute cardiopulmonary abnormality.   Electronically Signed   By: Lars Pinks M.D.   On: 04/06/2014 20:39     EKG Interpretation   Date/Time:  Tuesday April 06 2014 19:17:55 EDT Ventricular Rate:  66 PR Interval:  196 QRS Duration: 169 QT Interval:  512 QTC Calculation: 536 R Axis:   -27 Text Interpretation:  Sinus rhythm Left bundle branch block No significant  change was found Confirmed by Debby Freiberg [phone]) on 04/06/2014  7:21:35 PM      MDM   Final diagnoses:  Anxiety    80 year old male with past medical history of CAD, hypertension, diabetes, MI, GERD, A. fib presenting after a 30 minute episode of anxiety. Patient states he got scared while sitting at home alone. Patient denying having any other complaints. Patient asymptomatic in the ER, stating he believes he can go home. Patient febrile, non-tachycardic, nontachypneic, non-hypoxic, well-appearing and in no acute distress. Patients reporting subjective pallor and "shakiness". Patient denying any constipation for me, he states that he was constipated and in the hospital, however has been taking senna on a daily basis, and has been having regular bowel movements every day. He states he has not had any  issues since using the Senna.   9:17 PM: Patient  remaining asymptomatic. No leukocytosis, anemia from baseline the patient, negative troponin, chest x-ray unremarkable for any acute abnormality, EKG unremarkable for any changes from his baseline, I believe this episode of anxiety may have just been an isolated episode of anxiousness which resolved on its own. We will discharge patient at this time, and have him follow up with his PCP. I discussed return precautions with the patient and encourage him to call or return to the ER should his symptoms return, persist, worsen, change or should he have any questions or concerns. Patient was agreeable to this plan, and states he is "ready to go home as soon as possible".   BP 140/59  Pulse 63  Temp(Src) 97.5 F (36.4 C) (Oral)  Resp 22  Ht 5\' 7"  (1.702 m)  Wt 135 lb (61.236 kg)  BMI 21.14 kg/m2  SpO2 99%  Signed,  Dahlia Bailiff, PA-C 2:23 AM  This patient seen and discussed with Dr. Debby Freiberg, M.D.   Carrie Mew, PA-C 04/07/14 [phone]

## 2014-04-06 NOTE — Telephone Encounter (Signed)
OK to continue eliquis 2.5 mg BID

## 2014-04-06 NOTE — Telephone Encounter (Signed)
I called and spoke with the patient's daughter in law, [name] (she is on his DPR) and explained the conversation between our pharmacist and Dr. Irish Lack. She states the patient has been taking Eliquis 2.5 mg BID and won't pay for the 5 mg BID due to the increased cost on his co-pay. She does not know if he is in the donut-hole or not. She states the patient is not eating very much. His weight is already borderline and Dr. Irish Lack has mentioned the possibility of decreasing the dose of Eliquis to 2.5 mg BID any way. I advised [name] that I would let Dr. Irish Lack know he is currently taking 2.5 mg BID. She is agreeable.

## 2014-04-06 NOTE — Discharge Instructions (Signed)

## 2014-04-06 NOTE — ED Notes (Signed)
Pt back from X-Ray.  

## 2014-04-06 NOTE — Telephone Encounter (Signed)
Noted  

## 2014-04-06 NOTE — ED Notes (Signed)
EMS called out for constipation. He reports he has only had a small (like one hard ball) just every few days for months. Denies any stomach pain. States "there is something wrong with my stomach." Reports diagnosed yesterday with pancreatitis secondary to gallstones, but denies any abdominal pain. Prior to last night has not taken any medication to help with BM; last night tried Miralax and juice without results.

## 2014-04-08 NOTE — ED Provider Notes (Signed)
Medical screening examination/treatment/procedure(s) were conducted as a shared visit with non-physician practitioner(s) and myself.  I personally evaluated the patient during the encounter.   EKG Interpretation   Date/Time:  Tuesday April 06 2014 19:17:55 EDT Ventricular Rate:  66 PR Interval:  196 QRS Duration: 169 QT Interval:  512 QTC Calculation: 536 R Axis:   -27 Text Interpretation:  Sinus rhythm Left bundle branch block No significant  change was found Confirmed by Debby Freiberg [phone]) on 04/06/2014  7:21:35 PM      Briefly, pt is a 80 y.o. male presenting with anxious episode x approximately 30 minutes.  He denies chest pain, abd pain, pain of any sort, or any other symptoms.  He has been constipated recently, but this resolved.  I performed an examination on the patient including cardiac, pulmonary, and gi systems which were unremarkable.  Likely anxiety episode.  DC home in stable condition.     Debby Freiberg, MD 04/08/14 1031

## 2014-05-27 ENCOUNTER — Encounter (HOSPITAL_COMMUNITY): Payer: Self-pay | Admitting: Interventional Cardiology

## 2014-06-21 ENCOUNTER — Ambulatory Visit (INDEPENDENT_AMBULATORY_CARE_PROVIDER_SITE_OTHER): Payer: Medicare Other | Admitting: Podiatry

## 2014-06-21 ENCOUNTER — Ambulatory Visit: Payer: Medicare Other | Admitting: Podiatry

## 2014-06-21 VITALS — BP 128/80 | HR 100 | Resp 16

## 2014-06-21 DIAGNOSIS — M779 Enthesopathy, unspecified: Secondary | ICD-10-CM | POA: Diagnosis not present

## 2014-06-21 DIAGNOSIS — M79673 Pain in unspecified foot: Secondary | ICD-10-CM | POA: Diagnosis not present

## 2014-06-21 DIAGNOSIS — B351 Tinea unguium: Secondary | ICD-10-CM | POA: Diagnosis not present

## 2014-06-21 MED ORDER — TRIAMCINOLONE ACETONIDE 10 MG/ML IJ SUSP
10.0000 mg | Freq: Once | INTRAMUSCULAR | Status: AC
Start: 2014-06-21 — End: 2014-06-21
  Administered 2014-06-21: 10 mg

## 2014-06-23 NOTE — Progress Notes (Signed)
Subjective:     Patient ID: [name], male   DOB: Nov 22, 1922, 81 y.o.   MRN: [id number]  HPI patient states my right foot burns all the time around the joints and my nails are thick and I cannot cut them myself and they become painful   Review of Systems     Objective:   Physical Exam Neurovascular status unchanged with mycotic nail infection and pain 1-5 of both feet and moderate discomfort around the second and third metatarsal phalangeal joint with inflammation and fluid buildup noted    Assessment:     Symptomatic painful mycotic nail infection 1-5 both feet and probable inflammatory capsulitis second and third MPJ along with probable neuropathy which is a long-term issue for this patient    Plan:     Debrided nailbeds 1-5 both feet with no iatrogenic bleeding noted and careful injection of the second MPJ 3 mg dexamethasone Kenalog 5 mg Xylocaine to reduce inflammation around the capsule

## 2014-07-12 ENCOUNTER — Emergency Department (HOSPITAL_COMMUNITY)
Admission: EM | Admit: 2014-07-12 | Discharge: 2014-07-13 | Disposition: A | Discharge status: Home / Self Care | Payer: Medicare Other | Source: Home / Self Care | Attending: Emergency Medicine | Admitting: Emergency Medicine

## 2014-07-12 ENCOUNTER — Encounter (HOSPITAL_COMMUNITY): Payer: Self-pay | Admitting: Emergency Medicine

## 2014-07-12 DIAGNOSIS — I998 Other disorder of circulatory system: Secondary | ICD-10-CM

## 2014-07-12 DIAGNOSIS — D649 Anemia, unspecified: Secondary | ICD-10-CM

## 2014-07-12 DIAGNOSIS — Z7982 Long term (current) use of aspirin: Secondary | ICD-10-CM

## 2014-07-12 DIAGNOSIS — Z8719 Personal history of other diseases of the digestive system: Secondary | ICD-10-CM | POA: Insufficient documentation

## 2014-07-12 DIAGNOSIS — Z86010 Personal history of colonic polyps: Secondary | ICD-10-CM | POA: Insufficient documentation

## 2014-07-12 DIAGNOSIS — Z9889 Other specified postprocedural states: Secondary | ICD-10-CM

## 2014-07-12 DIAGNOSIS — M79671 Pain in right foot: Secondary | ICD-10-CM

## 2014-07-12 DIAGNOSIS — E785 Hyperlipidemia, unspecified: Secondary | ICD-10-CM | POA: Insufficient documentation

## 2014-07-12 DIAGNOSIS — E039 Hypothyroidism, unspecified: Secondary | ICD-10-CM | POA: Insufficient documentation

## 2014-07-12 DIAGNOSIS — R609 Edema, unspecified: Secondary | ICD-10-CM

## 2014-07-12 DIAGNOSIS — I252 Old myocardial infarction: Secondary | ICD-10-CM

## 2014-07-12 DIAGNOSIS — Z85818 Personal history of malignant neoplasm of other sites of lip, oral cavity, and pharynx: Secondary | ICD-10-CM

## 2014-07-12 DIAGNOSIS — I4891 Unspecified atrial fibrillation: Secondary | ICD-10-CM | POA: Insufficient documentation

## 2014-07-12 DIAGNOSIS — Z951 Presence of aortocoronary bypass graft: Secondary | ICD-10-CM

## 2014-07-12 DIAGNOSIS — Z7902 Long term (current) use of antithrombotics/antiplatelets: Secondary | ICD-10-CM

## 2014-07-12 DIAGNOSIS — E119 Type 2 diabetes mellitus without complications: Secondary | ICD-10-CM | POA: Insufficient documentation

## 2014-07-12 DIAGNOSIS — Z87891 Personal history of nicotine dependence: Secondary | ICD-10-CM

## 2014-07-12 DIAGNOSIS — I509 Heart failure, unspecified: Secondary | ICD-10-CM | POA: Insufficient documentation

## 2014-07-12 DIAGNOSIS — R4182 Altered mental status, unspecified: Secondary | ICD-10-CM | POA: Diagnosis not present

## 2014-07-12 DIAGNOSIS — I10 Essential (primary) hypertension: Secondary | ICD-10-CM

## 2014-07-12 DIAGNOSIS — Z79899 Other long term (current) drug therapy: Secondary | ICD-10-CM

## 2014-07-12 DIAGNOSIS — H9190 Unspecified hearing loss, unspecified ear: Secondary | ICD-10-CM | POA: Insufficient documentation

## 2014-07-12 DIAGNOSIS — Z7901 Long term (current) use of anticoagulants: Secondary | ICD-10-CM

## 2014-07-12 DIAGNOSIS — I2510 Atherosclerotic heart disease of native coronary artery without angina pectoris: Secondary | ICD-10-CM | POA: Insufficient documentation

## 2014-07-12 DIAGNOSIS — L03115 Cellulitis of right lower limb: Secondary | ICD-10-CM | POA: Diagnosis not present

## 2014-07-12 LAB — CBC WITH DIFFERENTIAL/PLATELET
BASOS ABS: 0 10*3/uL (ref 0.0–0.1)
BASOS PCT: 0 % (ref 0–1)
Eosinophils Absolute: 0.1 10*3/uL (ref 0.0–0.7)
Eosinophils Relative: 1 % (ref 0–5)
HCT: 33.7 % — ABNORMAL LOW (ref 39.0–52.0)
Hemoglobin: 11.1 g/dL — ABNORMAL LOW (ref 13.0–17.0)
LYMPHS ABS: 1.2 10*3/uL (ref 0.7–4.0)
Lymphocytes Relative: 12 % (ref 12–46)
MCH: 31.1 pg (ref 26.0–34.0)
MCHC: 32.9 g/dL (ref 30.0–36.0)
MCV: 94.4 fL (ref 78.0–100.0)
MONOS PCT: 9 % (ref 3–12)
Monocytes Absolute: 0.9 10*3/uL (ref 0.1–1.0)
NEUTROS PCT: 78 % — AB (ref 43–77)
Neutro Abs: 7.9 10*3/uL — ABNORMAL HIGH (ref 1.7–7.7)
PLATELETS: 269 10*3/uL (ref 150–400)
RBC: 3.57 MIL/uL — ABNORMAL LOW (ref 4.22–5.81)
RDW: 14.4 % (ref 11.5–15.5)
WBC: 10.1 10*3/uL (ref 4.0–10.5)

## 2014-07-12 LAB — COMPREHENSIVE METABOLIC PANEL
ALBUMIN: 3.6 g/dL (ref 3.5–5.2)
ALT: 15 U/L (ref 0–53)
AST: 16 U/L (ref 0–37)
Alkaline Phosphatase: 87 U/L (ref 39–117)
Anion gap: 11 (ref 5–15)
BUN: 35 mg/dL — AB (ref 6–23)
CO2: 29 mmol/L (ref 19–32)
Calcium: 8.9 mg/dL (ref 8.4–10.5)
Chloride: 98 mmol/L (ref 96–112)
Creatinine, Ser: 1.25 mg/dL (ref 0.50–1.35)
GFR calc Af Amer: 57 mL/min — ABNORMAL LOW (ref >90)
GFR calc non Af Amer: 49 mL/min — ABNORMAL LOW (ref >90)
Glucose, Bld: 216 mg/dL — ABNORMAL HIGH (ref 70–99)
POTASSIUM: 4.4 mmol/L (ref 3.5–5.1)
Sodium: 138 mmol/L (ref 135–145)
Total Bilirubin: 0.7 mg/dL (ref 0.3–1.2)
Total Protein: 6.4 g/dL (ref 6.0–8.3)

## 2014-07-12 NOTE — ED Notes (Signed)
Pt. reports right distal foot/toes swelling with pain onset this week worse today , denies injury.

## 2014-07-12 NOTE — ED Provider Notes (Signed)
CSN: [id number]     Arrival date & time 07/12/14  2110 History  This chart was scribed for [name] Rice, MD by [name], ED Scribe. This patient was seen in room D34C/D34C and the patient's care was started at 11:48 PM.    Chief Complaint  Patient presents with  . Foot Pain   Patient is a 81 y.o. male presenting with lower extremity pain. The history is provided by the patient and a relative. No language interpreter was used.  Foot Pain This is a recurrent problem. The current episode started more than 2 days ago. The problem occurs constantly. The problem has been gradually worsening. Pertinent negatives include no chest pain, no abdominal pain, no headaches and no shortness of breath. Exacerbated by: applied pressure. Nothing relieves the symptoms. He has tried nothing for the symptoms.     HPI Comments: Rodrigus [name] is a 81 y.o. male with past medical history of CHF, CM who presents to the Emergency Department complaining of 3 days of gradually worsening right foot pain and swelling. Patient reports prior experience with similar symptoms, at which time he was seen by a doctor and a "shot" was given, which relieved his pain. He is otherwise well; he denies fevers, chills.   Past Medical History  Diagnosis Date  . Coronary artery disease     s/p CABG 1996 then PCI of SVG to OM 2007  . Diverticulosis   . Dyslipidemia   . Renal artery stenosis     s/p bilateral stents  . Hiatal hernia   . Colon polyps   . Carotid artery stenosis     50% BILATERAL 2014  . Peripheral neuropathy   . Anemia   . Hypothyroidism   . Nasal polyps   . Hypertension   . Diabetes mellitus   . Throat cancer S/P TRACHECTOMY   . Myocardial infarction   . Peripheral vascular disease     LOWER EXT  . Atrial fibrillation/flutter Aug 2015  . GERD (gastroesophageal reflux disease)   . Shortness of breath     Hx: of  . HOH (hard of hearing) VERY   . Left bundle branch block (LBBB) on electrocardiogram  03/17/2014  . Ischemic cardiomyopathy Aug 2015    EF 25-30%   . History of throat cancer     s/p trach  . Aortic stenosis Aug 2015    mild to moderate  . Chronic anticoagulation Aug 2015    Eliquis  . CHF (congestive heart failure)    Past Surgical History  Procedure Laterality Date  . Coronary artery bypass graft      1996  . Cardiac catheterization      PCI of SVG to OM 2007  . Renal artery stent    . Tracheostomy    . Appendectomy    . Tonsillectomy    . Sp pta peripheral  March 2013    Lt SFA (Dr Saundra Shelling)  . Sp pta peripheral  May 2013    Rt SFA (Dr Bridgett Larsson)  . Lower extremity angiogram [name] 08/23/2011    Procedure: LOWER EXTREMITY ANGIOGRAM;  Surgeon: [name], MD;  Location: [facility] CATH LAB;  Service: Cardiovascular;  Laterality: [name];  . Percutaneous stent intervention Left 08/23/2011    Procedure: PERCUTANEOUS STENT INTERVENTION;  Surgeon: [name], MD;  Location: [facility] CATH LAB;  Service: Cardiovascular;  Laterality: Left;  . Lower extremity angiogram [name] 10/25/2011    Procedure: LOWER EXTREMITY ANGIOGRAM;  Surgeon: Conrad Ceiba, MD;  Location: [facility] CATH LAB;  Service: Cardiovascular;  Laterality: [name];  . Left heart catheterization with coronary/graft angiogram [name] 11/07/2012    Procedure: LEFT HEART CATHETERIZATION WITH Beatrix Fetters;  Surgeon: [name], MD;  Location: [facility] CATH LAB;  Service: Cardiovascular;  Laterality: [name];  . Cardioversion [name] 03/18/2014    Procedure: CARDIOVERSION;  Surgeon: Evans Lance, MD;  Location: [facility] CATH LAB;  Service: Cardiovascular;  Laterality: [name];   Family History  Problem Relation Age of Onset  . Hypertension Mother   . Heart attack Brother   . Diabetes Son    History  Substance Use Topics  . Smoking status: Former Smoker -- 0.30 packs/day    Types: Cigarettes    Quit date: 06/18/1946  . Smokeless tobacco: Never Used  . Alcohol Use: No    Review of Systems  Respiratory: Negative for shortness of breath.    Cardiovascular: Negative for chest pain.  Gastrointestinal: Negative for abdominal pain.  Musculoskeletal:       Right foot pain  Neurological: Negative for headaches.  All other systems reviewed and are negative.  Allergies  Review of patient's allergies indicates no known allergies.  Home Medications   Prior to Admission medications   Medication Sig Start Date End Date Taking? Authorizing Provider  acarbose (PRECOSE) 100 MG tablet Take 100 mg by mouth 3 (three) times daily. 02/20/14   Historical Provider, MD  acetaminophen (TYLENOL) 500 MG tablet Take 500 mg by mouth every 6 (six) hours as needed for headache.    Historical Provider, MD  amiodarone (PACERONE) 400 MG tablet Take 200 mg by mouth daily.    Historical Provider, MD  apixaban (ELIQUIS) 2.5 MG TABS tablet Take 2.5 mg by mouth 2 (two) times daily.    Historical Provider, MD  aspirin EC 81 MG tablet Take 81 mg by mouth 2 (two) times a week. Mondays and thursdays    Historical Provider, MD  atorvastatin (LIPITOR) 10 MG tablet Take 10 mg by mouth daily.    Historical Provider, MD  carvedilol (COREG) 6.25 MG tablet Take 6.25 mg by mouth 2 (two) times daily with a meal.    Historical Provider, MD  citalopram (CELEXA) 10 MG tablet Take 10 mg by mouth daily.    Historical Provider, MD  ferrous sulfate 325 (65 FE) MG tablet Take 325 mg by mouth daily with breakfast.    Historical Provider, MD  finasteride (PROSCAR) 5 MG tablet Take 5 mg by mouth daily.     Historical Provider, MD  furosemide (LASIX) 20 MG tablet Take 20 mg by mouth daily.    Historical Provider, MD  glipiZIDE (GLUCOTROL) 10 MG tablet Take 20 mg by mouth 2 (two) times daily before a meal.     Historical Provider, MD  levothyroxine (SYNTHROID, LEVOTHROID) 75 MCG tablet Take 75 mcg by mouth daily before breakfast.    Historical Provider, MD  lisinopril (PRINIVIL,ZESTRIL) 5 MG tablet Take 5 mg by mouth daily.    Historical Provider, MD  metFORMIN (GLUCOPHAGE) 500 MG tablet  Take 500 mg by mouth daily. 03/12/14   Historical Provider, MD  polyethylene glycol (MIRALAX / GLYCOLAX) packet Take 17 g by mouth daily as needed for mild constipation.    Historical Provider, MD  potassium chloride SA (K-DUR,KLOR-CON) 20 MEQ tablet Take 20 mEq by mouth 2 (two) times daily.    Historical Provider, MD  senna-docusate (SENOKOT-S) 8.6-50 MG per tablet Take 1 tablet by mouth at bedtime as needed for mild constipation. 03/29/14  Nishant Dhungel, MD  traMADol (ULTRAM) 50 MG tablet Take 1 tablet (50 mg total) by mouth every 6 (six) hours as needed. 07/13/14   [name] Rice, MD   BP 140/48 mmHg  Pulse 60  Temp(Src) 97.9 F (36.6 C) (Oral)  Resp 17  Ht 5\' 8"  (1.727 m)  Wt 150 lb (68.04 kg)  BMI 22.81 kg/m2  SpO2 99% Physical Exam  ED Course  Procedures   DIAGNOSTIC STUDIES: Oxygen Saturation is 100% on RA, normal by my interpretation.    COORDINATION OF CARE: 11:52 PM Discussed treatment plan with pt at bedside and pt agreed to plan.   Labs Review Labs Reviewed  CBC WITH DIFFERENTIAL/PLATELET - Abnormal; Notable for the following:    RBC 3.57 (*)    Hemoglobin 11.1 (*)    HCT 33.7 (*)    Neutrophils Relative % 78 (*)    Neutro Abs 7.9 (*)    All other components within normal limits  COMPREHENSIVE METABOLIC PANEL - Abnormal; Notable for the following:    Glucose, Bld 216 (*)    BUN 35 (*)    GFR calc non Af Amer 49 (*)    GFR calc Af Amer 57 (*)    All other components within normal limits  BRAIN NATRIURETIC PEPTIDE - Abnormal; Notable for the following:    B Natriuretic Peptide 315.7 (*)    All other components within normal limits  SEDIMENTATION RATE    Imaging Review Dg Foot Complete Right  07/13/2014   CLINICAL DATA:  81 year old male with a history of wound on the first metatarsophalangeal joint.  EXAM: RIGHT FOOT COMPLETE - 3+ VIEW  COMPARISON:  None.  FINDINGS: No acute fracture line identified.  No bony erosions identified.  No significant soft  tissue swelling.  No subcutaneous gas/emphysema.  Extensive calcifications of the anterior tibial artery and posterior tibial artery, as well as the small vessels of the foot.  Degenerative changes of the forefoot, midfoot, and hindfoot.  IMPRESSION: No acute bony abnormality identified.  Extensive vascular calcifications compatible with infrapopliteal and small vessel disease. If the patient has not yet been evaluated for claudication/CLI, noninvasive testing with ABI, segmental duplex, and segmental pulse volume recording may be considered as well as office based evaluation.  Signed,  Dulcy Fanny. Earleen Newport, DO  [facility]  [facility]   Electronically Signed   By: Corrie Mckusick D.O.   On: 07/13/2014 00:30     EKG Interpretation None      MDM   Final diagnoses:  Foot pain, right  Vascular insufficiency  Peripheral edema    I personally performed the services described in this documentation, which was scribed in my presence. The recorded information has been reviewed and considered.   White blood cell count and sedimentation rate are normal. X-ray without evidence of osteomyelitis. Discussed with radiologist and concern for chronic severe vascular ischemia. Discussed results with both the patient and his daughter-in-law. Have advised outpatient follow-up with either plastic surgery or interventional radiology. Foot remains warm with good cap refill though erythematous. I doubt infection. Patient does have bilateral lower extremity pitting edema. This likely represents mild exacerbation of his CHF. Have advised increasing dose of Lasix to 40 mg for the next few days until can be evaluated by his primary physician. He's been given return precautions and is voiced understanding.  [name] Rice, MD 07/13/14 [id number]

## 2014-07-13 ENCOUNTER — Emergency Department (HOSPITAL_COMMUNITY): Payer: Medicare Other

## 2014-07-13 LAB — SEDIMENTATION RATE: Sed Rate: 11 mm/hr (ref 0–16)

## 2014-07-13 LAB — BRAIN NATRIURETIC PEPTIDE: B NATRIURETIC PEPTIDE 5: 315.7 pg/mL — AB (ref 0.0–100.0)

## 2014-07-13 MED ORDER — TRAMADOL HCL 50 MG PO TABS
50.0000 mg | ORAL_TABLET | Freq: Four times a day (QID) | ORAL | Status: DC | PRN
Start: 2014-07-13 — End: 2014-08-02

## 2014-07-13 MED ORDER — TRAMADOL HCL 50 MG PO TABS
50.0000 mg | ORAL_TABLET | Freq: Once | ORAL | Status: AC
  Administered 2014-07-13: 50 mg via ORAL
  Filled 2014-07-13: qty 1

## 2014-07-13 NOTE — Discharge Instructions (Signed)
Take pain medication as needed for pain. Keep feet elevated and wear compression hose. You need to be evaluated for your chronic vascular insufficiency. Call and make an appointment with either the vascular surgeon or the interventional radiologist. Return for worsening swelling, redness, fever or for any concerns.   Edema Edema is an abnormal buildup of fluids in your bodytissues. Edema is somewhatdependent on gravity to pull the fluid to the lowest place in your body. That makes the condition more common in the legs and thighs (lower extremities). Painless swelling of the feet and ankles is common and becomes more likely as you get older. It is also common in looser tissues, like around your eyes.  When the affected area is squeezed, the fluid may move out of that spot and leave a dent for a few moments. This dent is called pitting.  CAUSES  There are many possible causes of edema. Eating too much salt and being on your feet or sitting for a long time can cause edema in your legs and ankles. Hot weather may make edema worse. Common medical causes of edema include:  Heart failure.  Liver disease.  Kidney disease.  Weak blood vessels in your legs.  Cancer.  An injury.  Pregnancy.  Some medications.  Obesity. SYMPTOMS  Edema is usually painless.Your skin may look swollen or shiny.  DIAGNOSIS  Your health care provider may be able to diagnose edema by asking about your medical history and doing a physical exam. You may need to have tests such as X-rays, an electrocardiogram, or blood tests to check for medical conditions that may cause edema.  TREATMENT  Edema treatment depends on the cause. If you have heart, liver, or kidney disease, you need the treatment appropriate for these conditions. General treatment may include:  Elevation of the affected body part above the level of your heart.  Compression of the affected body part. Pressure from elastic bandages or support stockings  squeezes the tissues and forces fluid back into the blood vessels. This keeps fluid from entering the tissues.  Restriction of fluid and salt intake.  Use of a water pill (diuretic). These medications are appropriate only for some types of edema. They pull fluid out of your body and make you urinate more often. This gets rid of fluid and reduces swelling, but diuretics can have side effects. Only use diuretics as directed by your health care provider. HOME CARE INSTRUCTIONS   Keep the affected body part above the level of your heart when you are lying down.   Do not sit still or stand for prolonged periods.   Do not put anything directly under your knees when lying down.  Do not wear constricting clothing or garters on your upper legs.   Exercise your legs to work the fluid back into your blood vessels. This may help the swelling go down.   Wear elastic bandages or support stockings to reduce ankle swelling as directed by your health care provider.   Eat a low-salt diet to reduce fluid if your health care provider recommends it.   Only take medicines as directed by your health care provider. SEEK MEDICAL CARE IF:   Your edema is not responding to treatment.  You have heart, liver, or kidney disease and notice symptoms of edema.  You have edema in your legs that does not improve after elevating them.   You have sudden and unexplained weight gain. SEEK IMMEDIATE MEDICAL CARE IF:   You develop shortness of breath  or chest pain.   You cannot breathe when you lie down.  You develop pain, redness, or warmth in the swollen areas.   You have heart, liver, or kidney disease and suddenly get edema.  You have a fever and your symptoms suddenly get worse. MAKE SURE YOU:   Understand these instructions.  Will watch your condition.  Will get help right away if you are not doing well or get worse. Document Released: 06/04/2005 Document Revised: 10/19/2013 Document  Reviewed: 03/27/2013 [facility] Patient Information 2015 [place], [place]. This information is not intended to replace advice given to you by your health care provider. Make sure you discuss any questions you have with your health care provider.

## 2014-07-14 ENCOUNTER — Encounter (HOSPITAL_COMMUNITY): Payer: Self-pay | Admitting: Emergency Medicine

## 2014-07-14 ENCOUNTER — Inpatient Hospital Stay (HOSPITAL_COMMUNITY)
Admission: EM | Admit: 2014-07-14 | Discharge: 2014-07-17 | DRG: 602 | Disposition: A | Discharge status: Home / Self Care | Payer: Medicare Other | Attending: Internal Medicine | Admitting: Internal Medicine

## 2014-07-14 ENCOUNTER — Emergency Department (HOSPITAL_COMMUNITY): Payer: Medicare Other

## 2014-07-14 DIAGNOSIS — I25810 Atherosclerosis of coronary artery bypass graft(s) without angina pectoris: Secondary | ICD-10-CM | POA: Diagnosis present

## 2014-07-14 DIAGNOSIS — I10 Essential (primary) hypertension: Secondary | ICD-10-CM | POA: Diagnosis present

## 2014-07-14 DIAGNOSIS — Z951 Presence of aortocoronary bypass graft: Secondary | ICD-10-CM

## 2014-07-14 DIAGNOSIS — W19XXXA Unspecified fall, initial encounter: Secondary | ICD-10-CM | POA: Diagnosis present

## 2014-07-14 DIAGNOSIS — E785 Hyperlipidemia, unspecified: Secondary | ICD-10-CM | POA: Diagnosis present

## 2014-07-14 DIAGNOSIS — Z955 Presence of coronary angioplasty implant and graft: Secondary | ICD-10-CM

## 2014-07-14 DIAGNOSIS — Z85819 Personal history of malignant neoplasm of unspecified site of lip, oral cavity, and pharynx: Secondary | ICD-10-CM

## 2014-07-14 DIAGNOSIS — I2510 Atherosclerotic heart disease of native coronary artery without angina pectoris: Secondary | ICD-10-CM | POA: Diagnosis present

## 2014-07-14 DIAGNOSIS — G629 Polyneuropathy, unspecified: Secondary | ICD-10-CM | POA: Diagnosis present

## 2014-07-14 DIAGNOSIS — D72829 Elevated white blood cell count, unspecified: Secondary | ICD-10-CM

## 2014-07-14 DIAGNOSIS — I447 Left bundle-branch block, unspecified: Secondary | ICD-10-CM | POA: Diagnosis present

## 2014-07-14 DIAGNOSIS — Z87891 Personal history of nicotine dependence: Secondary | ICD-10-CM

## 2014-07-14 DIAGNOSIS — I350 Nonrheumatic aortic (valve) stenosis: Secondary | ICD-10-CM | POA: Diagnosis present

## 2014-07-14 DIAGNOSIS — C140 Malignant neoplasm of pharynx, unspecified: Secondary | ICD-10-CM | POA: Diagnosis present

## 2014-07-14 DIAGNOSIS — Z7982 Long term (current) use of aspirin: Secondary | ICD-10-CM

## 2014-07-14 DIAGNOSIS — M79671 Pain in right foot: Secondary | ICD-10-CM

## 2014-07-14 DIAGNOSIS — M109 Gout, unspecified: Secondary | ICD-10-CM | POA: Diagnosis present

## 2014-07-14 DIAGNOSIS — H9190 Unspecified hearing loss, unspecified ear: Secondary | ICD-10-CM | POA: Diagnosis present

## 2014-07-14 DIAGNOSIS — E860 Dehydration: Secondary | ICD-10-CM

## 2014-07-14 DIAGNOSIS — E039 Hypothyroidism, unspecified: Secondary | ICD-10-CM | POA: Diagnosis present

## 2014-07-14 DIAGNOSIS — I252 Old myocardial infarction: Secondary | ICD-10-CM

## 2014-07-14 DIAGNOSIS — Z930 Tracheostomy status: Secondary | ICD-10-CM

## 2014-07-14 DIAGNOSIS — R531 Weakness: Secondary | ICD-10-CM

## 2014-07-14 DIAGNOSIS — D649 Anemia, unspecified: Secondary | ICD-10-CM | POA: Diagnosis present

## 2014-07-14 DIAGNOSIS — I4891 Unspecified atrial fibrillation: Secondary | ICD-10-CM | POA: Diagnosis present

## 2014-07-14 DIAGNOSIS — I739 Peripheral vascular disease, unspecified: Secondary | ICD-10-CM | POA: Diagnosis present

## 2014-07-14 DIAGNOSIS — Y92009 Unspecified place in unspecified non-institutional (private) residence as the place of occurrence of the external cause: Secondary | ICD-10-CM

## 2014-07-14 DIAGNOSIS — G9341 Metabolic encephalopathy: Secondary | ICD-10-CM | POA: Diagnosis present

## 2014-07-14 DIAGNOSIS — S0990XA Unspecified injury of head, initial encounter: Secondary | ICD-10-CM | POA: Insufficient documentation

## 2014-07-14 DIAGNOSIS — L03115 Cellulitis of right lower limb: Principal | ICD-10-CM | POA: Diagnosis present

## 2014-07-14 DIAGNOSIS — R627 Adult failure to thrive: Secondary | ICD-10-CM | POA: Diagnosis present

## 2014-07-14 DIAGNOSIS — I255 Ischemic cardiomyopathy: Secondary | ICD-10-CM | POA: Diagnosis present

## 2014-07-14 DIAGNOSIS — E871 Hypo-osmolality and hyponatremia: Secondary | ICD-10-CM | POA: Diagnosis present

## 2014-07-14 DIAGNOSIS — N179 Acute kidney failure, unspecified: Secondary | ICD-10-CM | POA: Diagnosis present

## 2014-07-14 DIAGNOSIS — L0390 Cellulitis, unspecified: Secondary | ICD-10-CM | POA: Diagnosis present

## 2014-07-14 DIAGNOSIS — K219 Gastro-esophageal reflux disease without esophagitis: Secondary | ICD-10-CM | POA: Diagnosis present

## 2014-07-14 DIAGNOSIS — E119 Type 2 diabetes mellitus without complications: Secondary | ICD-10-CM | POA: Diagnosis present

## 2014-07-14 DIAGNOSIS — Z7901 Long term (current) use of anticoagulants: Secondary | ICD-10-CM

## 2014-07-14 DIAGNOSIS — I509 Heart failure, unspecified: Secondary | ICD-10-CM | POA: Diagnosis present

## 2014-07-14 LAB — CBC WITH DIFFERENTIAL/PLATELET
BASOS ABS: 0 10*3/uL (ref 0.0–0.1)
Basophils Relative: 0 % (ref 0–1)
EOS ABS: 0 10*3/uL (ref 0.0–0.7)
EOS PCT: 0 % (ref 0–5)
HEMATOCRIT: 30.5 % — AB (ref 39.0–52.0)
HEMOGLOBIN: 10.2 g/dL — AB (ref 13.0–17.0)
LYMPHS ABS: 0.9 10*3/uL (ref 0.7–4.0)
LYMPHS PCT: 6 % — AB (ref 12–46)
MCH: 31.5 pg (ref 26.0–34.0)
MCHC: 33.4 g/dL (ref 30.0–36.0)
MCV: 94.1 fL (ref 78.0–100.0)
Monocytes Absolute: 1.3 10*3/uL — ABNORMAL HIGH (ref 0.1–1.0)
Monocytes Relative: 8 % (ref 3–12)
Neutro Abs: 13.3 10*3/uL — ABNORMAL HIGH (ref 1.7–7.7)
Neutrophils Relative %: 86 % — ABNORMAL HIGH (ref 43–77)
Platelets: 244 10*3/uL (ref 150–400)
RBC: 3.24 MIL/uL — AB (ref 4.22–5.81)
RDW: 14.4 % (ref 11.5–15.5)
WBC: 15.5 10*3/uL — AB (ref 4.0–10.5)

## 2014-07-14 LAB — CBG MONITORING, ED: Glucose-Capillary: 232 mg/dL — ABNORMAL HIGH (ref 70–99)

## 2014-07-14 MED ORDER — SODIUM CHLORIDE 0.9 % IV BOLUS (SEPSIS)
1000.0000 mL | Freq: Once | INTRAVENOUS | Status: AC
  Administered 2014-07-15: 1000 mL via INTRAVENOUS

## 2014-07-14 NOTE — ED Provider Notes (Signed)
CSN: [id number]     Arrival date & time 07/14/14  2209 History  This chart was scribed for Varney Biles, MD by Chester Holstein, ED Scribe. This patient was seen in room A01C/A01C and the patient's care was started at 11:35 PM.    Chief Complaint  Patient presents with  . Fall  . Altered Mental Status     Patient is a 81 y.o. male presenting with fall and altered mental status. The history is provided by the patient and a relative. No language interpreter was used.  Fall Pertinent negatives include no chest pain, no abdominal pain, no headaches and no shortness of breath.  Altered Mental Status Presenting symptoms: confusion   Associated symptoms: weakness   Associated symptoms: no abdominal pain, no headaches, no nausea and no rash   HPI Comments: [name] is a 81 y.o. male with PMHX of CAD, anemia, HTN, hypothyroidism, DM, Afib, LBBB, ischemic cardiomyopathy and throat Ca with PSH of tracheotomy who presents to the Emergency Department complaining of AMS and weakness. Pt's family member notes he fell from bed today.  She states he is very weak and his "legs are not working". Pt was unable to ambulate with walker. She notes he was found around 6 PM on the floor. Pt told one relative he was "working on the floor", and another relative that "he was putting on his pants".  She notes his confusion just started. Pt told doctor that he did not fall. Pt is oriented to self and location. Pt was seen in ED 2 days ago for a foot infection.  Noted swelling and erythema to right ankle and foot. Family notes pt indicated urgency today but went to restroom with minimal output. Pt lives alone. Pt denies headache, sore throat, SOB, cough, chest pain, abdominal pain, nausea, vomiting, diarrhea, dysuria, hematuria, and rash. Dr. Felipa Eth is pt's PCP.   Past Medical History  Diagnosis Date  . Coronary artery disease     s/p CABG 1996 then PCI of SVG to OM 2007  . Diverticulosis   . Dyslipidemia   .  Renal artery stenosis     s/p bilateral stents  . Hiatal hernia   . Colon polyps   . Carotid artery stenosis     50% BILATERAL 2014  . Peripheral neuropathy   . Anemia   . Hypothyroidism   . Nasal polyps   . Hypertension   . Diabetes mellitus   . Throat cancer S/P TRACHECTOMY   . Myocardial infarction   . Peripheral vascular disease     LOWER EXT  . Atrial fibrillation/flutter Aug 2015  . GERD (gastroesophageal reflux disease)   . Shortness of breath     Hx: of  . HOH (hard of hearing) VERY   . Left bundle branch block (LBBB) on electrocardiogram 03/17/2014  . Ischemic cardiomyopathy Aug 2015    EF 25-30%   . History of throat cancer     s/p trach  . Aortic stenosis Aug 2015    mild to moderate  . Chronic anticoagulation Aug 2015    Eliquis  . CHF (congestive heart failure)    Past Surgical History  Procedure Laterality Date  . Coronary artery bypass graft      1996  . Cardiac catheterization      PCI of SVG to OM 2007  . Renal artery stent    . Tracheostomy    . Appendectomy    . Tonsillectomy    . Sp pta peripheral  March 2013    Lt SFA (Dr Saundra Shelling)  . Sp pta peripheral  May 2013    Rt SFA (Dr Bridgett Larsson)  . Lower extremity angiogram [name] 08/23/2011    Procedure: LOWER EXTREMITY ANGIOGRAM;  Surgeon: [name], MD;  Location: [facility] CATH LAB;  Service: Cardiovascular;  Laterality: [name];  . Percutaneous stent intervention Left 08/23/2011    Procedure: PERCUTANEOUS STENT INTERVENTION;  Surgeon: [name], MD;  Location: [facility] CATH LAB;  Service: Cardiovascular;  Laterality: Left;  . Lower extremity angiogram [name] 10/25/2011    Procedure: LOWER EXTREMITY ANGIOGRAM;  Surgeon: Conrad West Sharyland, MD;  Location: [facility] CATH LAB;  Service: Cardiovascular;  Laterality: [name];  . Left heart catheterization with coronary/graft angiogram [name] 11/07/2012    Procedure: LEFT HEART CATHETERIZATION WITH Beatrix Fetters;  Surgeon: [name], MD;  Location: [facility] CATH LAB;  Service:  Cardiovascular;  Laterality: [name];  . Cardioversion [name] 03/18/2014    Procedure: CARDIOVERSION;  Surgeon: Evans Lance, MD;  Location: [facility] CATH LAB;  Service: Cardiovascular;  Laterality: [name];   Family History  Problem Relation Age of Onset  . Hypertension Mother   . Heart attack Brother   . Diabetes Son    History  Substance Use Topics  . Smoking status: Former Smoker -- 0.30 packs/day    Types: Cigarettes    Quit date: 06/18/1946  . Smokeless tobacco: Never Used  . Alcohol Use: No    Review of Systems  HENT: Negative for sore throat.   Respiratory: Negative for cough and shortness of breath.   Cardiovascular: Negative for chest pain.  Gastrointestinal: Negative for nausea, abdominal pain and diarrhea.  Genitourinary: Negative for dysuria and hematuria.  Musculoskeletal: Positive for joint swelling.  Skin: Positive for color change (acute erythema). Negative for rash.  Neurological: Positive for weakness. Negative for headaches.  Psychiatric/Behavioral: Positive for confusion.  All other systems reviewed and are negative.     Allergies  Review of patient's allergies indicates no known allergies.  Home Medications   Prior to Admission medications   Medication Sig Start Date End Date Taking? Authorizing Provider  acarbose (PRECOSE) 100 MG tablet Take 100 mg by mouth 3 (three) times daily. 02/20/14   Historical Provider, MD  acetaminophen (TYLENOL) 500 MG tablet Take 500 mg by mouth every 6 (six) hours as needed for headache.    Historical Provider, MD  amiodarone (PACERONE) 400 MG tablet Take 200 mg by mouth daily.    Historical Provider, MD  apixaban (ELIQUIS) 2.5 MG TABS tablet Take 2.5 mg by mouth 2 (two) times daily.    Historical Provider, MD  aspirin EC 81 MG tablet Take 81 mg by mouth 2 (two) times a week. Mondays and thursdays    Historical Provider, MD  atorvastatin (LIPITOR) 10 MG tablet Take 10 mg by mouth daily.    Historical Provider, MD  carvedilol (COREG)  6.25 MG tablet Take 6.25 mg by mouth 2 (two) times daily with a meal.    Historical Provider, MD  citalopram (CELEXA) 10 MG tablet Take 10 mg by mouth daily.    Historical Provider, MD  ferrous sulfate 325 (65 FE) MG tablet Take 325 mg by mouth daily with breakfast.    Historical Provider, MD  finasteride (PROSCAR) 5 MG tablet Take 5 mg by mouth daily.     Historical Provider, MD  furosemide (LASIX) 20 MG tablet Take 20 mg by mouth daily.    Historical Provider, MD  glipiZIDE (GLUCOTROL) 10 MG tablet  Take 20 mg by mouth 2 (two) times daily before a meal.     Historical Provider, MD  levothyroxine (SYNTHROID, LEVOTHROID) 75 MCG tablet Take 75 mcg by mouth daily before breakfast.    Historical Provider, MD  lisinopril (PRINIVIL,ZESTRIL) 5 MG tablet Take 5 mg by mouth daily.    Historical Provider, MD  metFORMIN (GLUCOPHAGE) 500 MG tablet Take 500 mg by mouth daily. 03/12/14   Historical Provider, MD  polyethylene glycol (MIRALAX / GLYCOLAX) packet Take 17 g by mouth daily as needed for mild constipation.    Historical Provider, MD  potassium chloride SA (K-DUR,KLOR-CON) 20 MEQ tablet Take 20 mEq by mouth 2 (two) times daily.    Historical Provider, MD  senna-docusate (SENOKOT-S) 8.6-50 MG per tablet Take 1 tablet by mouth at bedtime as needed for mild constipation. 03/29/14   Nishant Dhungel, MD  traMADol (ULTRAM) 50 MG tablet Take 1 tablet (50 mg total) by mouth every 6 (six) hours as needed. 07/13/14   Julianne Rice, MD   BP 127/47 mmHg  Pulse 56  Temp(Src) 97.9 F (36.6 C) (Oral)  Resp 18  SpO2 98% Physical Exam  Constitutional: He appears well-developed and well-nourished. No distress.  HENT:  Head: Normocephalic and atraumatic.  Eyes: Conjunctivae are normal. Right eye exhibits no discharge. Left eye exhibits no discharge.  Neck: Neck supple.  Pt able to turn neck laterally with no difficulty.  Cardiovascular: Normal rate and regular rhythm.  Exam reveals no gallop and no friction rub.    Murmur (systolic) heard. Pulmonary/Chest: Effort normal and breath sounds normal. No respiratory distress.  Abdominal: Soft. He exhibits no distension. There is no tenderness.  Musculoskeletal: He exhibits no edema or tenderness.       Cervical back: He exhibits no bony tenderness.  No clavicular or shoulder tenderness No midline spine tenderness No step-offs appreciated Mild erythema to back Pelvis is stable with no TTP Right sided unilateral leg swelling along with foot swelling; erythema with no calor  Neurological: He is alert.  Cranial Nerves II-XII grossly intact Cerebellar exam normal 4+/5 with normal grip strength Gross sensory exam for upper and lower extremities normal.  Skin: Skin is warm and dry.  Scalp: small bruise on vertex  Psychiatric: He has a normal mood and affect. His behavior is normal. Thought content normal.  Nursing note and vitals reviewed.   ED Course  Procedures (including critical care time) DIAGNOSTIC STUDIES: Oxygen Saturation is 100% on room air, normal by my interpretation.    COORDINATION OF CARE: 11:56 PM Discussed treatment plan with patient at beside, the patient agrees with the plan and has no further questions at this time.   Labs Review Labs Reviewed  CBC WITH DIFFERENTIAL/PLATELET - Abnormal; Notable for the following:    WBC 15.5 (*)    RBC 3.24 (*)    Hemoglobin 10.2 (*)    HCT 30.5 (*)    Neutrophils Relative % 86 (*)    Neutro Abs 13.3 (*)    Lymphocytes Relative 6 (*)    Monocytes Absolute 1.3 (*)    All other components within normal limits  COMPREHENSIVE METABOLIC PANEL - Abnormal; Notable for the following:    Sodium 132 (*)    Glucose, Bld 242 (*)    BUN 54 (*)    Creatinine, Ser 1.40 (*)    Calcium 8.3 (*)    Albumin 3.4 (*)    GFR calc non Af Amer 43 (*)    GFR calc Af Wyvonnia Lora  50 (*)    All other components within normal limits  URINALYSIS, ROUTINE W REFLEX MICROSCOPIC - Abnormal; Notable for the following:     Ketones, ur 15 (*)    All other components within normal limits  MAGNESIUM - Abnormal; Notable for the following:    Magnesium 0.7 (*)    All other components within normal limits  PHOSPHORUS - Abnormal; Notable for the following:    Phosphorus 1.6 (*)    All other components within normal limits  CBG MONITORING, ED - Abnormal; Notable for the following:    Glucose-Capillary 232 (*)    All other components within normal limits  URINE CULTURE  CK  D-DIMER, QUANTITATIVE  I-STAT CG4 LACTIC ACID, ED    Imaging Review Ct Head Wo Contrast  07/14/2014   CLINICAL DATA:  Patient was seen here 2 days ago for a fluid infection that has improved. Tonight the family found lying on the floor at home. No recollection of the fall or striking head. Confusion.  EXAM: CT HEAD WITHOUT CONTRAST  TECHNIQUE: Contiguous axial images were obtained from the base of the skull through the vertex without intravenous contrast.  COMPARISON:  03/17/2014  FINDINGS: Diffuse cerebral atrophy. Ventricular dilatation consistent with central atrophy. Low-attenuation changes in the deep white matter consistent with small vessel ischemia. Probable old encephalomalacia in the left parietal region. No change since prior study. No mass effect or midline shift. No abnormal extra-axial fluid collections. Gray-white matter junctions are distinct. Basal cisterns are not effaced. No evidence of acute intracranial hemorrhage. No depressed skull fractures. Diffuse opacification of the left frontal sinus and ethmoid air cells with mucosal thickening in the left maxillary antrum. Postoperative changes in the left maxillary antrum. Probable polyp or retention cyst in the left anterior nasal passage. Paranasal sinus changes are improved since prior study.  IMPRESSION: No acute intracranial abnormalities. Chronic atrophy and small vessel ischemic changes. Improving inflammatory changes in the paranasal sinuses.   Electronically Signed   By: Lucienne Capers M.D.   On: 07/14/2014 23:36   Dg Chest Port 1 View  07/15/2014   CLINICAL DATA:  Weakness and shortness of breath.  EXAM: PORTABLE CHEST - 1 VIEW  COMPARISON:  04/06/2014  FINDINGS: Postoperative changes in the mediastinum. Mild cardiac enlargement without vascular congestion. No focal airspace disease or consolidation in the lungs. No blunting of costophrenic angles. No pneumothorax.  IMPRESSION: Mild cardiac enlargement.  No evidence of active pulmonary disease.   Electronically Signed   By: Lucienne Capers M.D.   On: 07/15/2014 00:34     EKG Interpretation   Date/Time:  Thursday July 15 2014 00:12:18 EST Ventricular Rate:  59 PR Interval:  184 QRS Duration: 171 QT Interval:  501 QTC Calculation: 496 R Axis:   -47 Text Interpretation:  Sinus rhythm Left bundle branch block No significant  change since last tracing Confirmed by Kristen Bushway, MD, Thelma Comp [phone]) on  07/15/2014 1:34:08 AM      MDM   Final diagnoses:  Weakness  Hypomagnesemia  Cellulitis of right leg  Leukocytosis  Moderate dehydration    I personally performed the services described in this documentation, which was scribed in my presence. The recorded information has been reviewed and is accurate.  Pt comes in with cc of weakness, falls, unsteady gait. Pt has been progressively and griadually declining. PCP called, and advised to come to the ER. Pt has mild dementia, and doesn't recall the fall, but son and daughter in law provided bulk  of the history. Pt currently has no pain or complain, and lives by himself.  On exam - RLE swelling, and erythema of the foot noted. There is a ulcer of the plantar surface - and he was evaluated recently for it. The erythema is new and there is leukocytosis.  Will start vanc for cellulitis. Dimer neg. Family informed of the results. Will need Pt/Ot evaluation as his gait is unsteady.     Varney Biles, MD 07/15/14 [phone]

## 2014-07-14 NOTE — ED Notes (Signed)
Pt seen here 2 days ago for foot infection- family sts foot looks much better. Pt here tonight because family found him laying in the floor at home. Pt sts he does not remember falling or hitting head. Family concerned d/t pt being on Eliquis. Family reports pt is confused. Pt disoriented to time and situation.

## 2014-07-15 ENCOUNTER — Emergency Department (HOSPITAL_COMMUNITY): Payer: Medicare Other

## 2014-07-15 ENCOUNTER — Encounter (HOSPITAL_COMMUNITY): Payer: Self-pay | Admitting: Internal Medicine

## 2014-07-15 DIAGNOSIS — I252 Old myocardial infarction: Secondary | ICD-10-CM | POA: Diagnosis not present

## 2014-07-15 DIAGNOSIS — E119 Type 2 diabetes mellitus without complications: Secondary | ICD-10-CM | POA: Diagnosis present

## 2014-07-15 DIAGNOSIS — Z930 Tracheostomy status: Secondary | ICD-10-CM | POA: Diagnosis not present

## 2014-07-15 DIAGNOSIS — G629 Polyneuropathy, unspecified: Secondary | ICD-10-CM | POA: Diagnosis present

## 2014-07-15 DIAGNOSIS — R4182 Altered mental status, unspecified: Secondary | ICD-10-CM | POA: Diagnosis present

## 2014-07-15 DIAGNOSIS — K219 Gastro-esophageal reflux disease without esophagitis: Secondary | ICD-10-CM | POA: Diagnosis present

## 2014-07-15 DIAGNOSIS — Z85819 Personal history of malignant neoplasm of unspecified site of lip, oral cavity, and pharynx: Secondary | ICD-10-CM | POA: Diagnosis not present

## 2014-07-15 DIAGNOSIS — Z955 Presence of coronary angioplasty implant and graft: Secondary | ICD-10-CM | POA: Diagnosis not present

## 2014-07-15 DIAGNOSIS — D649 Anemia, unspecified: Secondary | ICD-10-CM | POA: Diagnosis present

## 2014-07-15 DIAGNOSIS — E860 Dehydration: Secondary | ICD-10-CM | POA: Diagnosis present

## 2014-07-15 DIAGNOSIS — L03115 Cellulitis of right lower limb: Principal | ICD-10-CM

## 2014-07-15 DIAGNOSIS — E785 Hyperlipidemia, unspecified: Secondary | ICD-10-CM | POA: Diagnosis present

## 2014-07-15 DIAGNOSIS — N179 Acute kidney failure, unspecified: Secondary | ICD-10-CM | POA: Diagnosis present

## 2014-07-15 DIAGNOSIS — I739 Peripheral vascular disease, unspecified: Secondary | ICD-10-CM | POA: Diagnosis present

## 2014-07-15 DIAGNOSIS — Z7901 Long term (current) use of anticoagulants: Secondary | ICD-10-CM | POA: Diagnosis not present

## 2014-07-15 DIAGNOSIS — H9190 Unspecified hearing loss, unspecified ear: Secondary | ICD-10-CM | POA: Diagnosis present

## 2014-07-15 DIAGNOSIS — E039 Hypothyroidism, unspecified: Secondary | ICD-10-CM | POA: Diagnosis present

## 2014-07-15 DIAGNOSIS — I2510 Atherosclerotic heart disease of native coronary artery without angina pectoris: Secondary | ICD-10-CM | POA: Diagnosis present

## 2014-07-15 DIAGNOSIS — Z7982 Long term (current) use of aspirin: Secondary | ICD-10-CM | POA: Diagnosis not present

## 2014-07-15 DIAGNOSIS — I447 Left bundle-branch block, unspecified: Secondary | ICD-10-CM | POA: Diagnosis present

## 2014-07-15 DIAGNOSIS — M109 Gout, unspecified: Secondary | ICD-10-CM | POA: Diagnosis present

## 2014-07-15 DIAGNOSIS — Z951 Presence of aortocoronary bypass graft: Secondary | ICD-10-CM | POA: Diagnosis not present

## 2014-07-15 DIAGNOSIS — I10 Essential (primary) hypertension: Secondary | ICD-10-CM | POA: Diagnosis present

## 2014-07-15 DIAGNOSIS — Y92009 Unspecified place in unspecified non-institutional (private) residence as the place of occurrence of the external cause: Secondary | ICD-10-CM | POA: Diagnosis not present

## 2014-07-15 DIAGNOSIS — W19XXXA Unspecified fall, initial encounter: Secondary | ICD-10-CM | POA: Diagnosis present

## 2014-07-15 DIAGNOSIS — I255 Ischemic cardiomyopathy: Secondary | ICD-10-CM | POA: Diagnosis present

## 2014-07-15 DIAGNOSIS — G9341 Metabolic encephalopathy: Secondary | ICD-10-CM | POA: Diagnosis present

## 2014-07-15 DIAGNOSIS — I4891 Unspecified atrial fibrillation: Secondary | ICD-10-CM | POA: Diagnosis present

## 2014-07-15 DIAGNOSIS — E871 Hypo-osmolality and hyponatremia: Secondary | ICD-10-CM | POA: Diagnosis present

## 2014-07-15 DIAGNOSIS — S0990XA Unspecified injury of head, initial encounter: Secondary | ICD-10-CM

## 2014-07-15 DIAGNOSIS — I509 Heart failure, unspecified: Secondary | ICD-10-CM | POA: Diagnosis present

## 2014-07-15 DIAGNOSIS — L0390 Cellulitis, unspecified: Secondary | ICD-10-CM | POA: Diagnosis present

## 2014-07-15 DIAGNOSIS — R627 Adult failure to thrive: Secondary | ICD-10-CM | POA: Diagnosis present

## 2014-07-15 DIAGNOSIS — Z87891 Personal history of nicotine dependence: Secondary | ICD-10-CM | POA: Diagnosis not present

## 2014-07-15 DIAGNOSIS — I350 Nonrheumatic aortic (valve) stenosis: Secondary | ICD-10-CM | POA: Diagnosis present

## 2014-07-15 LAB — CBC WITH DIFFERENTIAL/PLATELET
BASOS ABS: 0 10*3/uL (ref 0.0–0.1)
Basophils Relative: 0 % (ref 0–1)
Eosinophils Absolute: 0 10*3/uL (ref 0.0–0.7)
Eosinophils Relative: 0 % (ref 0–5)
HCT: 28.5 % — ABNORMAL LOW (ref 39.0–52.0)
HEMOGLOBIN: 9.7 g/dL — AB (ref 13.0–17.0)
LYMPHS ABS: 0.8 10*3/uL (ref 0.7–4.0)
LYMPHS PCT: 7 % — AB (ref 12–46)
MCH: 32.3 pg (ref 26.0–34.0)
MCHC: 34 g/dL (ref 30.0–36.0)
MCV: 95 fL (ref 78.0–100.0)
MONO ABS: 0.9 10*3/uL (ref 0.1–1.0)
Monocytes Relative: 8 % (ref 3–12)
NEUTROS ABS: 9.3 10*3/uL — AB (ref 1.7–7.7)
NEUTROS PCT: 85 % — AB (ref 43–77)
Platelets: 219 10*3/uL (ref 150–400)
RBC: 3 MIL/uL — ABNORMAL LOW (ref 4.22–5.81)
RDW: 14.5 % (ref 11.5–15.5)
WBC: 11 10*3/uL — ABNORMAL HIGH (ref 4.0–10.5)

## 2014-07-15 LAB — COMPREHENSIVE METABOLIC PANEL WITH GFR
ALT: 32 U/L (ref 0–53)
AST: 28 U/L (ref 0–37)
Albumin: 2.9 g/dL — ABNORMAL LOW (ref 3.5–5.2)
Alkaline Phosphatase: 75 U/L (ref 39–117)
Anion gap: 10 (ref 5–15)
BUN: 43 mg/dL — ABNORMAL HIGH (ref 6–23)
CO2: 26 mmol/L (ref 19–32)
Calcium: 8 mg/dL — ABNORMAL LOW (ref 8.4–10.5)
Chloride: 102 mmol/L (ref 96–112)
Creatinine, Ser: 1.14 mg/dL (ref 0.50–1.35)
GFR calc Af Amer: 64 mL/min — ABNORMAL LOW
GFR calc non Af Amer: 55 mL/min — ABNORMAL LOW
Glucose, Bld: 159 mg/dL — ABNORMAL HIGH (ref 70–99)
Potassium: 3.9 mmol/L (ref 3.5–5.1)
Sodium: 138 mmol/L (ref 135–145)
Total Bilirubin: 0.8 mg/dL (ref 0.3–1.2)
Total Protein: 5.8 g/dL — ABNORMAL LOW (ref 6.0–8.3)

## 2014-07-15 LAB — COMPREHENSIVE METABOLIC PANEL
ALT: 36 U/L (ref 0–53)
AST: 37 U/L (ref 0–37)
Albumin: 3.4 g/dL — ABNORMAL LOW (ref 3.5–5.2)
Alkaline Phosphatase: 79 U/L (ref 39–117)
Anion gap: 10 (ref 5–15)
BUN: 54 mg/dL — ABNORMAL HIGH (ref 6–23)
CALCIUM: 8.3 mg/dL — AB (ref 8.4–10.5)
CO2: 25 mmol/L (ref 19–32)
Chloride: 97 mmol/L (ref 96–112)
Creatinine, Ser: 1.4 mg/dL — ABNORMAL HIGH (ref 0.50–1.35)
GFR, EST AFRICAN AMERICAN: 50 mL/min — AB (ref >90)
GFR, EST NON AFRICAN AMERICAN: 43 mL/min — AB (ref >90)
Glucose, Bld: 242 mg/dL — ABNORMAL HIGH (ref 70–99)
POTASSIUM: 3.9 mmol/L (ref 3.5–5.1)
Sodium: 132 mmol/L — ABNORMAL LOW (ref 135–145)
TOTAL PROTEIN: 6.1 g/dL (ref 6.0–8.3)
Total Bilirubin: 0.8 mg/dL (ref 0.3–1.2)

## 2014-07-15 LAB — URINALYSIS, ROUTINE W REFLEX MICROSCOPIC
BILIRUBIN URINE: NEGATIVE
Glucose, UA: NEGATIVE mg/dL
HGB URINE DIPSTICK: NEGATIVE
KETONES UR: 15 mg/dL — AB
Leukocytes, UA: NEGATIVE
NITRITE: NEGATIVE
PROTEIN: NEGATIVE mg/dL
SPECIFIC GRAVITY, URINE: 1.019 (ref 1.005–1.030)
UROBILINOGEN UA: 0.2 mg/dL (ref 0.0–1.0)
pH: 5 (ref 5.0–8.0)

## 2014-07-15 LAB — GLUCOSE, CAPILLARY
GLUCOSE-CAPILLARY: 147 mg/dL — AB (ref 70–99)
GLUCOSE-CAPILLARY: 227 mg/dL — AB (ref 70–99)
GLUCOSE-CAPILLARY: 175 mg/dL — AB (ref 70–99)
GLUCOSE-CAPILLARY: 162 mg/dL — AB (ref 70–99)
Glucose-Capillary: 125 mg/dL — ABNORMAL HIGH (ref 70–99)

## 2014-07-15 LAB — PHOSPHORUS: PHOSPHORUS: 1.6 mg/dL — AB (ref 2.3–4.6)

## 2014-07-15 LAB — I-STAT CG4 LACTIC ACID, ED: Lactic Acid, Venous: 1.07 mmol/L (ref 0.5–2.0)

## 2014-07-15 LAB — MAGNESIUM
Magnesium: 0.7 mg/dL — CL (ref 1.5–2.5)
Magnesium: 1.7 mg/dL (ref 1.5–2.5)

## 2014-07-15 LAB — D-DIMER, QUANTITATIVE: D-Dimer, Quant: 0.37 ug/mL-FEU (ref 0.00–0.48)

## 2014-07-15 LAB — CK: Total CK: 159 U/L (ref 7–232)

## 2014-07-15 MED ORDER — FERROUS SULFATE 325 (65 FE) MG PO TABS
325.0000 mg | ORAL_TABLET | Freq: Every day | ORAL | Status: DC
  Administered 2014-07-15 – 2014-07-17 (×3): 325 mg via ORAL
  Filled 2014-07-15 (×4): qty 1

## 2014-07-15 MED ORDER — FUROSEMIDE 20 MG PO TABS
20.0000 mg | ORAL_TABLET | Freq: Every day | ORAL | Status: DC
  Filled 2014-07-15: qty 1

## 2014-07-15 MED ORDER — LISINOPRIL 5 MG PO TABS
5.0000 mg | ORAL_TABLET | Freq: Every day | ORAL | Status: DC
  Filled 2014-07-15: qty 1

## 2014-07-15 MED ORDER — LEVOTHYROXINE SODIUM 75 MCG PO TABS
75.0000 ug | ORAL_TABLET | Freq: Every day | ORAL | Status: DC
  Administered 2014-07-15: 75 ug via ORAL
  Filled 2014-07-15 (×2): qty 1

## 2014-07-15 MED ORDER — LEVOTHYROXINE SODIUM 100 MCG PO TABS
100.0000 ug | ORAL_TABLET | Freq: Every day | ORAL | Status: DC
  Administered 2014-07-16 – 2014-07-17 (×2): 100 ug via ORAL
  Filled 2014-07-15 (×3): qty 1

## 2014-07-15 MED ORDER — POLYETHYLENE GLYCOL 3350 17 G PO PACK
17.0000 g | PACK | Freq: Every day | ORAL | Status: DC | PRN
  Filled 2014-07-15: qty 1

## 2014-07-15 MED ORDER — LEVOTHYROXINE SODIUM 150 MCG PO TABS
150.0000 ug | ORAL_TABLET | Freq: Every day | ORAL | Status: DC
  Filled 2014-07-15: qty 1

## 2014-07-15 MED ORDER — AMIODARONE HCL 200 MG PO TABS
200.0000 mg | ORAL_TABLET | Freq: Every day | ORAL | Status: DC
  Administered 2014-07-15 – 2014-07-17 (×3): 200 mg via ORAL
  Filled 2014-07-15 (×3): qty 1

## 2014-07-15 MED ORDER — ACARBOSE 100 MG PO TABS
100.0000 mg | ORAL_TABLET | Freq: Three times a day (TID) | ORAL | Status: DC
  Administered 2014-07-15 – 2014-07-17 (×7): 100 mg via ORAL
  Filled 2014-07-15 (×9): qty 1

## 2014-07-15 MED ORDER — APIXABAN 2.5 MG PO TABS
2.5000 mg | ORAL_TABLET | Freq: Two times a day (BID) | ORAL | Status: DC
  Administered 2014-07-15 – 2014-07-17 (×6): 2.5 mg via ORAL
  Filled 2014-07-15 (×7): qty 1

## 2014-07-15 MED ORDER — INSULIN ASPART 100 UNIT/ML ~~LOC~~ SOLN
0.0000 [IU] | Freq: Every day | SUBCUTANEOUS | Status: DC
  Administered 2014-07-16: 3 [IU] via SUBCUTANEOUS

## 2014-07-15 MED ORDER — ACETAMINOPHEN 650 MG RE SUPP: 650.0000 mg | Freq: Four times a day (QID) | RECTAL | Status: DC | PRN

## 2014-07-15 MED ORDER — SODIUM CHLORIDE 0.9 % IV SOLN: 250.0000 mL | INTRAVENOUS | Status: DC | PRN

## 2014-07-15 MED ORDER — VANCOMYCIN HCL IN DEXTROSE 1-5 GM/200ML-% IV SOLN
1000.0000 mg | Freq: Once | INTRAVENOUS | Status: AC
  Administered 2014-07-15: 1000 mg via INTRAVENOUS
  Filled 2014-07-15: qty 200

## 2014-07-15 MED ORDER — ACETAMINOPHEN 325 MG PO TABS
650.0000 mg | ORAL_TABLET | Freq: Four times a day (QID) | ORAL | Status: DC | PRN
  Administered 2014-07-16: 650 mg via ORAL
  Filled 2014-07-15: qty 2

## 2014-07-15 MED ORDER — MAGNESIUM OXIDE 400 (241.3 MG) MG PO TABS
400.0000 mg | ORAL_TABLET | Freq: Every day | ORAL | Status: DC
  Administered 2014-07-15 – 2014-07-17 (×3): 400 mg via ORAL
  Filled 2014-07-15 (×4): qty 1

## 2014-07-15 MED ORDER — CARVEDILOL 6.25 MG PO TABS
6.2500 mg | ORAL_TABLET | Freq: Two times a day (BID) | ORAL | Status: DC
  Administered 2014-07-15 – 2014-07-17 (×5): 6.25 mg via ORAL
  Filled 2014-07-15 (×7): qty 1

## 2014-07-15 MED ORDER — SODIUM CHLORIDE 0.9 % IV SOLN
250.0000 mL | INTRAVENOUS | Status: DC
  Administered 2014-07-15: 250 mL via INTRAVENOUS

## 2014-07-15 MED ORDER — GLIPIZIDE 10 MG PO TABS
20.0000 mg | ORAL_TABLET | Freq: Two times a day (BID) | ORAL | Status: DC
  Administered 2014-07-15 – 2014-07-17 (×5): 20 mg via ORAL
  Filled 2014-07-15 (×7): qty 2

## 2014-07-15 MED ORDER — FINASTERIDE 5 MG PO TABS
5.0000 mg | ORAL_TABLET | Freq: Every day | ORAL | Status: DC
  Administered 2014-07-15 – 2014-07-17 (×3): 5 mg via ORAL
  Filled 2014-07-15 (×3): qty 1

## 2014-07-15 MED ORDER — CITALOPRAM HYDROBROMIDE 10 MG PO TABS
10.0000 mg | ORAL_TABLET | Freq: Every day | ORAL | Status: DC
  Administered 2014-07-15 – 2014-07-17 (×3): 10 mg via ORAL
  Filled 2014-07-15 (×4): qty 1

## 2014-07-15 MED ORDER — METFORMIN HCL 500 MG PO TABS
500.0000 mg | ORAL_TABLET | Freq: Every day | ORAL | Status: DC
  Filled 2014-07-15 (×2): qty 1

## 2014-07-15 MED ORDER — INSULIN ASPART 100 UNIT/ML ~~LOC~~ SOLN
0.0000 [IU] | Freq: Three times a day (TID) | SUBCUTANEOUS | Status: DC
  Administered 2014-07-15: 2 [IU] via SUBCUTANEOUS
  Administered 2014-07-15: 1 [IU] via SUBCUTANEOUS
  Administered 2014-07-15 – 2014-07-16 (×2): 3 [IU] via SUBCUTANEOUS
  Administered 2014-07-16: 7 [IU] via SUBCUTANEOUS
  Administered 2014-07-16: 1 [IU] via SUBCUTANEOUS
  Administered 2014-07-17: 2 [IU] via SUBCUTANEOUS
  Administered 2014-07-17: 3 [IU] via SUBCUTANEOUS

## 2014-07-15 MED ORDER — ASPIRIN EC 81 MG PO TBEC
81.0000 mg | DELAYED_RELEASE_TABLET | ORAL | Status: DC
  Administered 2014-07-15: 81 mg via ORAL
  Filled 2014-07-15: qty 1

## 2014-07-15 MED ORDER — POTASSIUM CHLORIDE CRYS ER 20 MEQ PO TBCR
20.0000 meq | EXTENDED_RELEASE_TABLET | Freq: Two times a day (BID) | ORAL | Status: DC
  Administered 2014-07-15 – 2014-07-17 (×6): 20 meq via ORAL
  Filled 2014-07-15 (×8): qty 1

## 2014-07-15 MED ORDER — SODIUM CHLORIDE 0.9 % IJ SOLN
3.0000 mL | Freq: Two times a day (BID) | INTRAMUSCULAR | Status: DC
  Administered 2014-07-15 – 2014-07-17 (×4): 3 mL via INTRAVENOUS

## 2014-07-15 MED ORDER — SODIUM CHLORIDE 0.9 % IJ SOLN: 3.0000 mL | INTRAMUSCULAR | Status: DC | PRN

## 2014-07-15 MED ORDER — TRAMADOL HCL 50 MG PO TABS
50.0000 mg | ORAL_TABLET | Freq: Four times a day (QID) | ORAL | Status: DC | PRN
  Administered 2014-07-16: 50 mg via ORAL
  Filled 2014-07-15: qty 1

## 2014-07-15 MED ORDER — SENNOSIDES-DOCUSATE SODIUM 8.6-50 MG PO TABS: 1.0000 | ORAL_TABLET | Freq: Every evening | ORAL | Status: DC | PRN

## 2014-07-15 MED ORDER — MAGNESIUM SULFATE 2 GM/50ML IV SOLN
2.0000 g | Freq: Once | INTRAVENOUS | Status: AC
Start: 2014-07-15 — End: 2014-07-15
  Administered 2014-07-15: 2 g via INTRAVENOUS
  Filled 2014-07-15: qty 50

## 2014-07-15 MED ORDER — ATORVASTATIN CALCIUM 10 MG PO TABS
10.0000 mg | ORAL_TABLET | Freq: Every day | ORAL | Status: DC
  Administered 2014-07-15 – 2014-07-17 (×3): 10 mg via ORAL
  Filled 2014-07-15 (×3): qty 1

## 2014-07-15 NOTE — ED Notes (Signed)
Critical lab magnesium 0.7, Dr. Kathrynn Humble informed.

## 2014-07-15 NOTE — ED Notes (Signed)
Pt ambulated in hall with assistance with this tech, with medium assist, pt seem to have moderate tenderness to right leg. MD and RN made aware.

## 2014-07-15 NOTE — ED Notes (Signed)
Dr. Kim at bedside   

## 2014-07-15 NOTE — Progress Notes (Signed)
Utilization review completed. Hamad Whyte, RN, BSN. 

## 2014-07-15 NOTE — Progress Notes (Signed)
Patient's stoma is clean and dry.

## 2014-07-15 NOTE — Progress Notes (Addendum)
Patient Demographics  [name], is a 81 y.o. male, DOB - 10/01/1922, WLN:[id number]  Admit date - 07/14/2014   Admitting Physician Jani Gravel, MD  Outpatient Primary MD for the patient is Mathews Argyle, MD  LOS - 1   Chief Complaint  Patient presents with  . Fall  . Altered Mental Status        Subjective:   [name] today has, No headache, No chest pain, No abdominal pain - No Nausea, No new weakness tingling or numbness, No Cough - SOB.   Assessment & Plan    1. Fall due to generalized weakness and dehydration. CT head stable, nonfocal exam, no headache, gentle hydration, monitor orthostatics, PT eval. Advance activity. May require placement.   2. Leukocytosis. Likely reactionary from stress, chest x-ray stable, UA stable, tracheostomy site stable. Afebrile. We will monitor.   3. History of throat cancer. Status post Tracheostomy. Site looks clean. Chest x-ray stable. No cough.   4. Ischemic cardiomyopathy with CAD left bundle branch block. EF 25%. Currently slightly dehydrated, continue aspirin, Coreg, ACE/ARB on hold due to acute renal failure.  5. ARF. Due to dehydration. Gently hydrate, hold Lasix and ace/ARB.   6. DM type II. Continue glipizide and acarbose, sliding scale added.  CBG (last 3)   Recent Labs  07/14/14 2246 07/15/14 0353 07/15/14 0803  GLUCAP 232* 147* 125*     Lab Results  Component Value Date   HGBA1C 7.9* 02/07/2014    7. Atrial fibrillation. On amiodarone, Coreg along with Eliquis.   8. Mild metabolic encephalopathy on admission. Currently appears to be at baseline resolved. CT head stable.   9. Hyponatremia. Hypomagnesemia. Hydrate, replace magnesium, repeat BMP and magnesium in the morning.   10. Mild anemia. Outpatient  age-appropriate workup.   11. ? right leg cellulitis on admission. None by my exam. Hold off antibiotics.      Code Status: Full  Family Communication: none present  Disposition Plan: TBD   Procedures CT Head   Consults      Medications  Scheduled Meds: . acarbose  100 mg Oral TID  . amiodarone  200 mg Oral Daily  . apixaban  2.5 mg Oral BID  . aspirin EC  81 mg Oral Once per day on Mon Thu  . atorvastatin  10 mg Oral Daily  . carvedilol  6.25 mg Oral BID WC  . citalopram  10 mg Oral Daily  . ferrous sulfate  325 mg Oral Q breakfast  . finasteride  5 mg Oral Daily  . glipiZIDE  20 mg Oral BID AC  . insulin aspart  0-5 Units Subcutaneous QHS  . insulin aspart  0-9 Units Subcutaneous TID WC  . levothyroxine  75 mcg Oral QAC breakfast  . magnesium oxide  400 mg Oral Daily  . potassium chloride SA  20 mEq Oral BID  . sodium chloride  3 mL Intravenous Q12H   Continuous Infusions: . sodium chloride 250 mL (07/15/14 0624)   PRN Meds:.acetaminophen **OR** acetaminophen, polyethylene glycol, senna-docusate, sodium chloride, traMADol  DVT Prophylaxis  Eliquis  Lab Results  Component Value Date   PLT 244 07/14/2014    Antibiotics     Anti-infectives    Start  Dose/Rate Route Frequency Ordered Stop   07/15/14 0130  vancomycin (VANCOCIN) IVPB 1000 mg/200 mL premix     1,000 mg200 mL/hr over 60 Minutes Intravenous  Once 07/15/14 0127 07/15/14 0244          Objective:   Filed Vitals:   07/15/14 0554 07/15/14 0619 07/15/14 0621 07/15/14 0744  BP: 126/41 132/46 136/43   Pulse: 56 59 63 65  Temp: 97.4 F (36.3 C)     TempSrc: Oral     Resp: 18   18  Height:      Weight: 65.5 kg (144 lb 6.4 oz)     SpO2: 99% 100% 100% 99%    Wt Readings from Last 3 Encounters:  07/15/14 65.5 kg (144 lb 6.4 oz)  07/12/14 68.04 kg (150 lb)  04/06/14 61.236 kg (135 lb)     Intake/Output Summary (Last 24 hours) at 07/15/14 0941 Last data filed at 07/15/14 0930   Gross per 24 hour  Intake    342 ml  Output    550 ml  Net   -208 ml     Physical Exam  Awake Alert, Oriented X 3, No new F.N deficits, Normal affect [place].AT,PERRAL Supple Neck,No JVD, No cervical lymphadenopathy appriciated. Tracheostomy site clean. Symmetrical Chest wall movement, Good air movement bilaterally, CTAB RRR,No Gallops,Rubs or new Murmurs, No Parasternal Heave +ve B.Sounds, Abd Soft, No tenderness, No organomegaly appriciated, No rebound - guarding or rigidity. No Cyanosis, Clubbing or edema, No new Rash or bruise      Data Review   Micro Results No results found for this or any previous visit (from the past 240 hour(s)).  Radiology Reports Ct Head Wo Contrast  07/14/2014   CLINICAL DATA:  Patient was seen here 2 days ago for a fluid infection that has improved. Tonight the family found lying on the floor at home. No recollection of the fall or striking head. Confusion.  EXAM: CT HEAD WITHOUT CONTRAST  TECHNIQUE: Contiguous axial images were obtained from the base of the skull through the vertex without intravenous contrast.  COMPARISON:  03/17/2014  FINDINGS: Diffuse cerebral atrophy. Ventricular dilatation consistent with central atrophy. Low-attenuation changes in the deep white matter consistent with small vessel ischemia. Probable old encephalomalacia in the left parietal region. No change since prior study. No mass effect or midline shift. No abnormal extra-axial fluid collections. Gray-white matter junctions are distinct. Basal cisterns are not effaced. No evidence of acute intracranial hemorrhage. No depressed skull fractures. Diffuse opacification of the left frontal sinus and ethmoid air cells with mucosal thickening in the left maxillary antrum. Postoperative changes in the left maxillary antrum. Probable polyp or retention cyst in the left anterior nasal passage. Paranasal sinus changes are improved since prior study.  IMPRESSION: No acute intracranial abnormalities.  Chronic atrophy and small vessel ischemic changes. Improving inflammatory changes in the paranasal sinuses.   Electronically Signed   By: Lucienne Capers M.D.   On: 07/14/2014 23:36   Dg Chest Port 1 View  07/15/2014   CLINICAL DATA:  Weakness and shortness of breath.  EXAM: PORTABLE CHEST - 1 VIEW  COMPARISON:  04/06/2014  FINDINGS: Postoperative changes in the mediastinum. Mild cardiac enlargement without vascular congestion. No focal airspace disease or consolidation in the lungs. No blunting of costophrenic angles. No pneumothorax.  IMPRESSION: Mild cardiac enlargement.  No evidence of active pulmonary disease.   Electronically Signed   By: Lucienne Capers M.D.   On: 07/15/2014 00:34   Dg Foot Complete  Right  07/13/2014   CLINICAL DATA:  81 year old male with a history of wound on the first metatarsophalangeal joint.  EXAM: RIGHT FOOT COMPLETE - 3+ VIEW  COMPARISON:  None.  FINDINGS: No acute fracture line identified.  No bony erosions identified.  No significant soft tissue swelling.  No subcutaneous gas/emphysema.  Extensive calcifications of the anterior tibial artery and posterior tibial artery, as well as the small vessels of the foot.  Degenerative changes of the forefoot, midfoot, and hindfoot.  IMPRESSION: No acute bony abnormality identified.  Extensive vascular calcifications compatible with infrapopliteal and small vessel disease. If the patient has not yet been evaluated for claudication/CLI, noninvasive testing with ABI, segmental duplex, and segmental pulse volume recording may be considered as well as office based evaluation.  Signed,  Dulcy Fanny. Earleen Newport, DO  [facility]  [facility]   Electronically Signed   By: Corrie Mckusick D.O.   On: 07/13/2014 00:30     CBC  Recent Labs Lab 07/12/14 2123 07/14/14 2249  WBC 10.1 15.5*  HGB 11.1* 10.2*  HCT 33.7* 30.5*  PLT 269 244  MCV 94.4 94.1  MCH 31.1 31.5  MCHC 32.9 33.4  RDW 14.4 14.4    LYMPHSABS 1.2 0.9  MONOABS 0.9 1.3*  EOSABS 0.1 0.0  BASOSABS 0.0 0.0    Chemistries   Recent Labs Lab 07/12/14 2123 07/14/14 2249 07/15/14 0008  NA 138 132*  --   K 4.4 3.9  --   CL 98 97  --   CO2 29 25  --   GLUCOSE 216* 242*  --   BUN 35* 54*  --   CREATININE 1.25 1.40*  --   CALCIUM 8.9 8.3*  --   MG  --   --  0.7*  AST 16 37  --   ALT 15 36  --   ALKPHOS 87 79  --   BILITOT 0.7 0.8  --    ------------------------------------------------------------------------------------------------------------------ estimated creatinine clearance is 33.1 mL/min (by C-G formula based on Cr of 1.4). ------------------------------------------------------------------------------------------------------------------ No results for input(s): HGBA1C in the last 72 hours. ------------------------------------------------------------------------------------------------------------------ No results for input(s): CHOL, HDL, LDLCALC, TRIG, CHOLHDL, LDLDIRECT in the last 72 hours. ------------------------------------------------------------------------------------------------------------------ No results for input(s): TSH, T4TOTAL, T3FREE, THYROIDAB in the last 72 hours.  Invalid input(s): FREET3 ------------------------------------------------------------------------------------------------------------------ No results for input(s): VITAMINB12, FOLATE, FERRITIN, TIBC, IRON, RETICCTPCT in the last 72 hours.  Coagulation profile No results for input(s): INR, PROTIME in the last 168 hours.   Recent Labs  07/15/14 0008  DDIMER 0.37    Cardiac Enzymes No results for input(s): CKMB, TROPONINI, MYOGLOBIN in the last 168 hours.  Invalid input(s): CK ------------------------------------------------------------------------------------------------------------------ Invalid input(s): POCBNP     Time Spent in minutes   35   SINGH,PRASHANT K M.D on 07/15/2014 at 9:41 AM  Between 7am to  7pm - Pager - [phone]  After 7pm go to [email or web address] - [facility] Office  [phone]

## 2014-07-15 NOTE — Evaluation (Signed)
Physical Therapy Evaluation Patient Details Name: [name] MRN: [id number] DOB: [birth year] Today's Date: 07/15/2014   History of Present Illness  pt is an 81 y/o male admitted after found down on the floor and unable to get up.  On arrival determined to be dehydrated and started gentle rehydration among other work up.  Clinical Impression  Pt's weakness has generally resolved with the rehydration.  Pt generally back to baseline functioning.  No further PT needs at this time. Will sign off at this time.     Follow Up Recommendations No PT follow up;Supervision - Intermittent    Equipment Recommendations  None recommended by PT    Recommendations for Other Services       Precautions / Restrictions Precautions Precautions: Fall Restrictions Weight Bearing Restrictions: No      Mobility  Bed Mobility                  Transfers Overall transfer level: Needs assistance   Transfers: Sit to/from Stand Sit to Stand: Min guard         General transfer comment: safe technique  Ambulation/Gait Ambulation/Gait assistance: Min assist;Min guard Ambulation Distance (Feet): 130 Feet Assistive device: 1 person hand held assist (or rail) Gait Pattern/deviations: Step-through pattern;Scissoring;Drifts right/left;Narrow base of support Gait velocity: slower   General Gait Details: generally steady with HHA or holding to the rail.  Noticeably my guarded  and more unsteady with assistive device.  Stairs            Wheelchair Mobility    Modified Rankin (Stroke Patients Only)       Balance Overall balance assessment: Needs assistance Sitting-balance support: No upper extremity supported Sitting balance-Leahy Scale: Fair     Standing balance support: Single extremity supported Standing balance-Leahy Scale: Fair Standing balance comment: could accept much challenge.                             Pertinent Vitals/Pain Pain Assessment:  Faces Faces Pain Scale: Hurts little more Pain Location: R plantar foot wound Pain Descriptors / Indicators: Sore Pain Intervention(s): Limited activity within patient's tolerance    Home Living Family/patient expects to be discharged to:: Private residence Living Arrangements: Alone Available Help at Discharge: Family;Available PRN/intermittently Type of Home: House Home Access: Stairs to enter;Ramped entrance Entrance Stairs-Rails: Right;Left Entrance Stairs-Number of Steps: 3-4 at sons house, ramp to enter pts home Home Layout: One level Home Equipment: Cane - single point;Walker - 4 wheels Additional Comments: pt prepares own breakfast, goes to daughter's for supper    Prior Function Level of Independence: Independent         Comments: still drives, performs sponge baths, hx of tracheostomy     Hand Dominance   Dominant Hand: Right    Extremity/Trunk Assessment   Upper Extremity Assessment: Overall WFL for tasks assessed (some shoulder limitation)           Lower Extremity Assessment: Overall WFL for tasks assessed;Generalized weakness (proximal weakness)         Communication   Communication: HOH  Cognition Arousal/Alertness: Awake/alert Behavior During Therapy: WFL for tasks assessed/performed Overall Cognitive Status: Within Functional Limits for tasks assessed                      General Comments      Exercises        Assessment/Plan    PT Assessment Patent does not need any  further PT services  PT Diagnosis Abnormality of gait;Generalized weakness   PT Problem List    PT Treatment Interventions     PT Goals (Current goals can be found in the Care Plan section) Acute Rehab PT Goals PT Goal Formulation: All assessment and education complete, DC therapy    Frequency     Barriers to discharge        Co-evaluation               End of Session   Activity Tolerance: Patient tolerated treatment well Patient left: in  chair;with call bell/phone within reach;with family/visitor present Nurse Communication: Mobility status         Time: 9678-9381 PT Time Calculation (min) (ACUTE ONLY): 29 min   Charges:   PT Evaluation $Initial PT Evaluation Tier I: 1 Procedure PT Treatments $Gait Training: 8-22 mins   PT G Codes:        [name], Tessie Fass 07/15/2014, 2:58 PM  07/15/2014  Donnella Sham, PT [phone]  (pager)

## 2014-07-15 NOTE — Discharge Instructions (Addendum)
Follow with Primary MD Mathews Argyle, MD in 7 days   Get CBC, CMP, 2 view Chest X ray checked  by Primary MD next visit.    Activity: As tolerated with Full fall precautions use walker/cane & assistance as needed   Disposition Home    Diet: Heart Healthy Low Carb , with feeding assistance and aspiration precautions as needed.  For Heart failure patients - Check your Weight same time everyday, if you gain over 2 pounds, or you develop in leg swelling, experience more shortness of breath or chest pain, call your Primary MD immediately. Follow Cardiac Low Salt Diet and 1.8 lit/day fluid restriction.   On your next visit with your primary care physician please Get Medicines reviewed and adjusted.   Please request your Prim.MD to go over all Hospital Tests and Procedure/Radiological results at the follow up, please get all Hospital records sent to your Prim MD by signing hospital release before you go home.   If you experience worsening of your admission symptoms, develop shortness of breath, life threatening emergency, suicidal or homicidal thoughts you must seek medical attention immediately by calling 911 or calling your MD immediately  if symptoms less severe.  You Must read complete instructions/literature along with all the possible adverse reactions/side effects for all the Medicines you take and that have been prescribed to you. Take any new Medicines after you have completely understood and accpet all the possible adverse reactions/side effects.   Do not drive, operating heavy machinery, perform activities at heights, swimming or participation in water activities or provide baby sitting services if your were admitted for syncope or siezures until you have seen by Primary MD or a Neurologist and advised to do so again.  Do not drive when taking Pain medications.    Do not take more than prescribed Pain, Sleep and Anxiety Medications  Special Instructions: If you have smoked  or chewed Tobacco  in the last 2 yrs please stop smoking, stop any regular Alcohol  and or any Recreational drug use.  Wear Seat belts while driving.   Please note  You were cared for by a hospitalist during your hospital stay. If you have any questions about your discharge medications or the care you received while you were in the hospital after you are discharged, you can call the unit and asked to speak with the hospitalist on call if the hospitalist that took care of you is not available. Once you are discharged, your primary care physician will handle any further medical issues. Please note that NO REFILLS for any discharge medications will be authorized once you are discharged, as it is imperative that you return to your primary care physician (or establish a relationship with a primary care physician if you do not have one) for your aftercare needs so that they can reassess your need for medications and monitor your lab values.       Information on my medicine - ELIQUIS (apixaban)  This medication education was reviewed with me or my healthcare representative as part of my discharge preparation.    Why was Eliquis prescribed for you? Eliquis was prescribed for you to reduce the risk of a blood clot forming that can cause a stroke if you have a medical condition called atrial fibrillation (a type of irregular heartbeat).  What do You need to know about Eliquis ? Take your Eliquis TWICE DAILY - one tablet in the morning and one tablet in the evening with or without food. If  you have difficulty swallowing the tablet whole please discuss with your pharmacist how to take the medication safely.  Take Eliquis exactly as prescribed by your doctor and DO NOT stop taking Eliquis without talking to the doctor who prescribed the medication.  Stopping may increase your risk of developing a stroke.  Refill your prescription before you run out.  After discharge, you should have regular  check-up appointments with your healthcare provider that is prescribing your Eliquis.  In the future your dose may need to be changed if your kidney function or weight changes by a significant amount or as you get older.  What do you do if you miss a dose? If you miss a dose, take it as soon as you remember on the same day and resume taking twice daily.  Do not take more than one dose of ELIQUIS at the same time to make up a missed dose.  Important Safety Information A possible side effect of Eliquis is bleeding. You should call your healthcare provider right away if you experience any of the following: ? Bleeding from an injury or your nose that does not stop. ? Unusual colored urine (red or dark brown) or unusual colored stools (red or black). ? Unusual bruising for unknown reasons. ? A serious fall or if you hit your head (even if there is no bleeding).  Some medicines may interact with Eliquis and might increase your risk of bleeding or clotting while on Eliquis. To help avoid this, consult your healthcare provider or pharmacist prior to using any new prescription or non-prescription medications, including herbals, vitamins, non-steroidal anti-inflammatory drugs (NSAIDs) and supplements.  This website has more information on Eliquis (apixaban): http://www.eliquis.com/eliquis/home

## 2014-07-15 NOTE — H&P (Addendum)
[name] is an 81 y.o. male.    Chief Complaint: FTT, mild cellulitis HPI: 81 yo male with CAD s/p CABG, Dm2, CHF (EF 25-30%), AS, Afib, (chads2=5)  Apparently fell at home and could not walk,  Pt was noted to have redness of the right distal lower ext thought to be mild cellulitis per ED, and mild leukocytosis.  Pt will be admitted for FTT, mild cellultis.   Past Medical History  Diagnosis Date  . Coronary artery disease     s/p CABG 1996 then PCI of SVG to OM 2007  . Diverticulosis   . Dyslipidemia   . Renal artery stenosis     s/p bilateral stents  . Hiatal hernia   . Colon polyps   . Carotid artery stenosis     50% BILATERAL 2014  . Peripheral neuropathy   . Anemia   . Hypothyroidism   . Nasal polyps   . Hypertension   . Diabetes mellitus   . Throat cancer S/P TRACHECTOMY   . Myocardial infarction   . Peripheral vascular disease     LOWER EXT  . Atrial fibrillation/flutter Aug 2015  . GERD (gastroesophageal reflux disease)   . Shortness of breath     Hx: of  . HOH (hard of hearing) VERY   . Left bundle branch block (LBBB) on electrocardiogram 03/17/2014  . Ischemic cardiomyopathy Aug 2015    EF 25-30%   . History of throat cancer     s/p trach  . Aortic stenosis Aug 2015    mild to moderate  . Chronic anticoagulation Aug 2015    Eliquis  . CHF (congestive heart failure)     Past Surgical History  Procedure Laterality Date  . Coronary artery bypass graft      1996  . Cardiac catheterization      PCI of SVG to OM 2007  . Renal artery stent    . Tracheostomy    . Appendectomy    . Tonsillectomy    . Sp pta peripheral  March 2013    Lt SFA (Dr Saundra Shelling)  . Sp pta peripheral  May 2013    Rt SFA (Dr Bridgett Larsson)  . Lower extremity angiogram [name] 08/23/2011    Procedure: LOWER EXTREMITY ANGIOGRAM;  Surgeon: [name], MD;  Location: [facility] CATH LAB;  Service: Cardiovascular;  Laterality: [name];  . Percutaneous stent intervention Left 08/23/2011    Procedure:  PERCUTANEOUS STENT INTERVENTION;  Surgeon: [name], MD;  Location: [facility] CATH LAB;  Service: Cardiovascular;  Laterality: Left;  . Lower extremity angiogram [name] 10/25/2011    Procedure: LOWER EXTREMITY ANGIOGRAM;  Surgeon: Conrad Buckingham, MD;  Location: [facility] CATH LAB;  Service: Cardiovascular;  Laterality: [name];  . Left heart catheterization with coronary/graft angiogram [name] 11/07/2012    Procedure: LEFT HEART CATHETERIZATION WITH Beatrix Fetters;  Surgeon: [name], MD;  Location: [facility] CATH LAB;  Service: Cardiovascular;  Laterality: [name];  . Cardioversion [name] 03/18/2014    Procedure: CARDIOVERSION;  Surgeon: Evans Lance, MD;  Location: [facility] CATH LAB;  Service: Cardiovascular;  Laterality: [name];    Family History  Problem Relation Age of Onset  . Hypertension Mother   . Heart attack Brother   . Diabetes Son    Social History:  reports that he quit smoking about 68 years ago. His smoking use included Cigarettes. He smoked 0.30 packs per day. He has never used smokeless tobacco. He reports that he does not drink alcohol or use  illicit drugs.  Allergies: No Known Allergies Medications reviewed  (Not in a hospital admission)  Results for orders placed or performed during the hospital encounter of 07/14/14 (from the past 48 hour(s))  CBG monitoring, ED     Status: Abnormal   Collection Time: 07/14/14 10:46 PM  Result Value Ref Range   Glucose-Capillary 232 (H) 70 - 99 mg/dL   Comment 1 Documented in Chart    Comment 2 Notify RN   CBC with Differential     Status: Abnormal   Collection Time: 07/14/14 10:49 PM  Result Value Ref Range   WBC 15.5 (H) 4.0 - 10.5 K/uL   RBC 3.24 (L) 4.22 - 5.81 MIL/uL   Hemoglobin 10.2 (L) 13.0 - 17.0 g/dL   HCT 30.5 (L) 39.0 - 52.0 %   MCV 94.1 78.0 - 100.0 fL   MCH 31.5 26.0 - 34.0 pg   MCHC 33.4 30.0 - 36.0 g/dL   RDW 14.4 11.5 - 15.5 %   Platelets 244 150 - 400 K/uL   Neutrophils Relative % 86 (H) 43 - 77 %   Neutro Abs 13.3 (H)  1.7 - 7.7 K/uL   Lymphocytes Relative 6 (L) 12 - 46 %   Lymphs Abs 0.9 0.7 - 4.0 K/uL   Monocytes Relative 8 3 - 12 %   Monocytes Absolute 1.3 (H) 0.1 - 1.0 K/uL   Eosinophils Relative 0 0 - 5 %   Eosinophils Absolute 0.0 0.0 - 0.7 K/uL   Basophils Relative 0 0 - 1 %   Basophils Absolute 0.0 0.0 - 0.1 K/uL  Comprehensive metabolic panel     Status: Abnormal   Collection Time: 07/14/14 10:49 PM  Result Value Ref Range   Sodium 132 (L) 135 - 145 mmol/L   Potassium 3.9 3.5 - 5.1 mmol/L   Chloride 97 96 - 112 mmol/L   CO2 25 19 - 32 mmol/L   Glucose, Bld 242 (H) 70 - 99 mg/dL   BUN 54 (H) 6 - 23 mg/dL   Creatinine, Ser 1.40 (H) 0.50 - 1.35 mg/dL   Calcium 8.3 (L) 8.4 - 10.5 mg/dL   Total Protein 6.1 6.0 - 8.3 g/dL   Albumin 3.4 (L) 3.5 - 5.2 g/dL   AST 37 0 - 37 U/L   ALT 36 0 - 53 U/L   Alkaline Phosphatase 79 39 - 117 U/L   Total Bilirubin 0.8 0.3 - 1.2 mg/dL   GFR calc non Af Amer 43 (L) >90 mL/min   GFR calc Af Amer 50 (L) >90 mL/min    Comment: (NOTE) The eGFR has been calculated using the CKD EPI equation. This calculation has not been validated in all clinical situations. eGFR's persistently <90 mL/min signify possible Chronic Kidney Disease.    Anion gap 10 5 - 15  Magnesium     Status: Abnormal   Collection Time: 07/15/14 12:08 AM  Result Value Ref Range   Magnesium 0.7 (LL) 1.5 - 2.5 mg/dL    Comment: CRITICAL RESULT CALLED TO, READ BACK BY AND VERIFIED WITH: PATRAW,B RN 07/15/2014 0125 JORDANS REPEATED TO VERIFY   Phosphorus     Status: Abnormal   Collection Time: 07/15/14 12:08 AM  Result Value Ref Range   Phosphorus 1.6 (L) 2.3 - 4.6 mg/dL  CK     Status: None   Collection Time: 07/15/14 12:08 AM  Result Value Ref Range   Total CK 159 7 - 232 U/L  D-dimer, quantitative     Status:  None   Collection Time: 07/15/14 12:08 AM  Result Value Ref Range   D-Dimer, Quant 0.37 0.00 - 0.48 ug/mL-FEU    Comment:        AT THE INHOUSE ESTABLISHED CUTOFF VALUE OF  0.48 ug/mL FEU, THIS ASSAY HAS BEEN DOCUMENTED IN THE LITERATURE TO HAVE A SENSITIVITY AND NEGATIVE PREDICTIVE VALUE OF AT LEAST 98 TO 99%.  THE TEST RESULT SHOULD BE CORRELATED WITH AN ASSESSMENT OF THE CLINICAL PROBABILITY OF DVT / VTE.   Urinalysis, Routine w reflex microscopic     Status: Abnormal   Collection Time: 07/15/14 12:21 AM  Result Value Ref Range   Color, Urine YELLOW YELLOW   APPearance CLEAR CLEAR   Specific Gravity, Urine 1.019 1.005 - 1.030   pH 5.0 5.0 - 8.0   Glucose, UA NEGATIVE NEGATIVE mg/dL   Hgb urine dipstick NEGATIVE NEGATIVE   Bilirubin Urine NEGATIVE NEGATIVE   Ketones, ur 15 (A) NEGATIVE mg/dL   Protein, ur NEGATIVE NEGATIVE mg/dL   Urobilinogen, UA 0.2 0.0 - 1.0 mg/dL   Nitrite NEGATIVE NEGATIVE   Leukocytes, UA NEGATIVE NEGATIVE    Comment: MICROSCOPIC NOT DONE ON URINES WITH NEGATIVE PROTEIN, BLOOD, LEUKOCYTES, NITRITE, OR GLUCOSE <1000 mg/dL.  I-Stat CG4 Lactic Acid, ED     Status: None   Collection Time: 07/15/14 12:26 AM  Result Value Ref Range   Lactic Acid, Venous 1.07 0.5 - 2.0 mmol/L   Ct Head Wo Contrast  07/14/2014   CLINICAL DATA:  Patient was seen here 2 days ago for a fluid infection that has improved. Tonight the family found lying on the floor at home. No recollection of the fall or striking head. Confusion.  EXAM: CT HEAD WITHOUT CONTRAST  TECHNIQUE: Contiguous axial images were obtained from the base of the skull through the vertex without intravenous contrast.  COMPARISON:  03/17/2014  FINDINGS: Diffuse cerebral atrophy. Ventricular dilatation consistent with central atrophy. Low-attenuation changes in the deep white matter consistent with small vessel ischemia. Probable old encephalomalacia in the left parietal region. No change since prior study. No mass effect or midline shift. No abnormal extra-axial fluid collections. Gray-white matter junctions are distinct. Basal cisterns are not effaced. No evidence of acute intracranial  hemorrhage. No depressed skull fractures. Diffuse opacification of the left frontal sinus and ethmoid air cells with mucosal thickening in the left maxillary antrum. Postoperative changes in the left maxillary antrum. Probable polyp or retention cyst in the left anterior nasal passage. Paranasal sinus changes are improved since prior study.  IMPRESSION: No acute intracranial abnormalities. Chronic atrophy and small vessel ischemic changes. Improving inflammatory changes in the paranasal sinuses.   Electronically Signed   By: Lucienne Capers M.D.   On: 07/14/2014 23:36   Dg Chest Port 1 View  07/15/2014   CLINICAL DATA:  Weakness and shortness of breath.  EXAM: PORTABLE CHEST - 1 VIEW  COMPARISON:  04/06/2014  FINDINGS: Postoperative changes in the mediastinum. Mild cardiac enlargement without vascular congestion. No focal airspace disease or consolidation in the lungs. No blunting of costophrenic angles. No pneumothorax.  IMPRESSION: Mild cardiac enlargement.  No evidence of active pulmonary disease.   Electronically Signed   By: Lucienne Capers M.D.   On: 07/15/2014 00:34    Review of Systems  Constitutional: Negative for fever, chills, weight loss, malaise/fatigue and diaphoresis.  HENT: Negative for congestion, ear discharge, ear pain, hearing loss, nosebleeds, sore throat and tinnitus.   Eyes: Negative for blurred vision, double vision, photophobia, pain, discharge  and redness.  Respiratory: Negative for cough, hemoptysis, sputum production, shortness of breath, wheezing and stridor.   Cardiovascular: Negative for chest pain, palpitations, orthopnea, claudication, leg swelling and PND.  Gastrointestinal: Negative for heartburn, nausea, vomiting, abdominal pain, diarrhea, constipation, blood in stool and melena.  Genitourinary: Negative for dysuria, urgency, frequency, hematuria and flank pain.  Musculoskeletal: Negative for myalgias, back pain, joint pain, falls and neck pain.  Skin: Positive  for rash. Negative for itching.  Neurological: Negative for dizziness, tingling, tremors, sensory change, speech change, focal weakness, seizures, loss of consciousness, weakness and headaches.  Endo/Heme/Allergies: Negative for environmental allergies and polydipsia. Does not bruise/bleed easily.  Psychiatric/Behavioral: Positive for memory loss. Negative for depression, suicidal ideas, hallucinations and substance abuse. The patient is not nervous/anxious and does not have insomnia.     Blood pressure 127/47, pulse 56, temperature 97.9 F (36.6 C), temperature source Oral, resp. rate 18, SpO2 98 %. Physical Exam  Constitutional: He appears well-developed and well-nourished.  HENT:  Head: Normocephalic and atraumatic.  Eyes: Conjunctivae and EOM are normal. Pupils are equal, round, and reactive to light.  Neck: Normal range of motion. Neck supple. No JVD present. No tracheal deviation present. No thyromegaly present.  Cardiovascular: Normal rate and regular rhythm.  Exam reveals no gallop and no friction rub.   Murmur heard. Respiratory: Effort normal and breath sounds normal. No respiratory distress. He has no wheezes. He has no rales.  GI: Soft. Bowel sounds are normal. He exhibits no distension. There is no tenderness. There is no rebound and no guarding.  Musculoskeletal: Normal range of motion. He exhibits no edema or tenderness.  Lymphadenopathy:    He has no cervical adenopathy.  Neurological: He is alert. He has normal reflexes. He displays normal reflexes. No cranial nerve deficit. He exhibits normal muscle tone. Coordination normal.  Skin: Skin is warm. Rash noted. There is erythema. No pallor.  Redness of the dorsum of the right foot, evidence of prior tracheostomy neck  Psychiatric: He has a normal mood and affect. His behavior is normal. Judgment and thought content normal.     Assessment/Plan Cellulitis (mild) Rocephin 1gm iv qday, convert tomorrioow to po keflex.    Leukocytosis seconadry to fall/ cellulitis (mild) Check cbc in am  Anemia Check cbc in am tomorrow, consider iron studies, b12, folate, esr, tsh  Dm2 fsbs ac and qhs, iss  Hyponatremia Check cmp in am  Hypomagnesemia Check mag in am,  Mag oxide 481m po qday  DVT prophylaxis on eliquis   Marlon Vonruden 07/15/2014, 2:04 AM

## 2014-07-15 NOTE — Progress Notes (Signed)
Report received from [facility]

## 2014-07-16 ENCOUNTER — Inpatient Hospital Stay (HOSPITAL_COMMUNITY): Payer: Medicare Other

## 2014-07-16 DIAGNOSIS — I25708 Atherosclerosis of coronary artery bypass graft(s), unspecified, with other forms of angina pectoris: Secondary | ICD-10-CM

## 2014-07-16 LAB — BASIC METABOLIC PANEL
Anion gap: 3 — ABNORMAL LOW (ref 5–15)
BUN: 29 mg/dL — ABNORMAL HIGH (ref 6–23)
CO2: 30 mmol/L (ref 19–32)
CREATININE: 0.96 mg/dL (ref 0.50–1.35)
Calcium: 8.3 mg/dL — ABNORMAL LOW (ref 8.4–10.5)
Chloride: 106 mmol/L (ref 96–112)
GFR calc Af Amer: 83 mL/min — ABNORMAL LOW (ref >90)
GFR calc non Af Amer: 71 mL/min — ABNORMAL LOW (ref >90)
Glucose, Bld: 106 mg/dL — ABNORMAL HIGH (ref 70–99)
Potassium: 4 mmol/L (ref 3.5–5.1)
SODIUM: 139 mmol/L (ref 135–145)

## 2014-07-16 LAB — GLUCOSE, CAPILLARY
GLUCOSE-CAPILLARY: 124 mg/dL — AB (ref 70–99)
Glucose-Capillary: 231 mg/dL — ABNORMAL HIGH (ref 70–99)
Glucose-Capillary: 327 mg/dL — ABNORMAL HIGH (ref 70–99)
Glucose-Capillary: 285 mg/dL — ABNORMAL HIGH (ref 70–99)

## 2014-07-16 LAB — URINE CULTURE
Colony Count: NO GROWTH
Culture: NO GROWTH

## 2014-07-16 LAB — CBC
HCT: 27.7 % — ABNORMAL LOW (ref 39.0–52.0)
Hemoglobin: 9.4 g/dL — ABNORMAL LOW (ref 13.0–17.0)
MCH: 32.1 pg (ref 26.0–34.0)
MCHC: 33.9 g/dL (ref 30.0–36.0)
MCV: 94.5 fL (ref 78.0–100.0)
PLATELETS: 224 10*3/uL (ref 150–400)
RBC: 2.93 MIL/uL — ABNORMAL LOW (ref 4.22–5.81)
RDW: 14.4 % (ref 11.5–15.5)
WBC: 9.5 10*3/uL (ref 4.0–10.5)

## 2014-07-16 LAB — HEMOGLOBIN A1C
Hgb A1c MFr Bld: 7.1 % — ABNORMAL HIGH (ref 4.8–5.6)
MEAN PLASMA GLUCOSE: 157 mg/dL

## 2014-07-16 LAB — URIC ACID: Uric Acid, Serum: 6 mg/dL (ref 4.0–7.8)

## 2014-07-16 MED ORDER — METHYLPREDNISOLONE SODIUM SUCC 125 MG IJ SOLR
80.0000 mg | Freq: Once | INTRAMUSCULAR | Status: AC
  Administered 2014-07-16: 80 mg via INTRAVENOUS
  Filled 2014-07-16: qty 2

## 2014-07-16 NOTE — Progress Notes (Signed)
Patient Demographics  [name], is a 81 y.o. male, DOB - 1922-12-31, ZGY:[id number]  Admit date - 07/14/2014   Admitting Physician Jani Gravel, MD  Outpatient Primary MD for the patient is Mathews Argyle, MD  LOS - 2   Chief Complaint  Patient presents with  . Fall  . Altered Mental Status        Subjective:   [name] today has, No headache, No chest pain, No abdominal pain - No Nausea, No new weakness tingling or numbness, No Cough - SOB. Does have right first toe pain.  Assessment & Plan    1. Fall due to generalized weakness and dehydration. CT head stable, nonfocal exam, no headache, gentle hydration, monitor orthostatics, PT eval. Advance activity. May require placement.   2. Leukocytosis. Likely reactionary from stress, chest x-ray stable, UA stable, tracheostomy site stable. Afebrile. Leukocytosis almost resolved. Likely due to for gout   3. History of throat cancer. Status post Tracheostomy. Site looks clean. Chest x-ray stable. No cough.   4. Ischemic cardiomyopathy with CAD left bundle branch block. EF 25%. Currently slightly dehydrated, continue aspirin, Coreg, ACE/ARB on hold due to acute renal failure.   5. ARF. Due to dehydration. Gently hydrate, hold Lasix and ace/ARB.   6. DM type II. Continue glipizide and acarbose, sliding scale added.  CBG (last 3)   Recent Labs  07/15/14 1715 07/15/14 2135 07/16/14 0807  GLUCAP 175* 162* 124*     Lab Results  Component Value Date   HGBA1C 7.1* 07/15/2014    7. Atrial fibrillation. On amiodarone, Coreg along with Eliquis.   8. Mild metabolic encephalopathy on admission. Currently appears to be at baseline resolved. CT head stable.   9. Hyponatremia. Hypomagnesemia. Hydrate, replace magnesium, repeat  BMP and magnesium in the morning.   10. Mild anemia. Outpatient age-appropriate workup.   11. Gout.? right leg cellulitis on admission. None by my exam. Hold off antibiotics. His right big toe does appear to have mild gout. Will check x-ray along with uric acid levels, place on Solu-Medrol and monitor.      Code Status: Full  Family Communication: none present  Disposition Plan: TBD   Procedures CT Head   Consults      Medications  Scheduled Meds: . acarbose  100 mg Oral TID  . amiodarone  200 mg Oral Daily  . apixaban  2.5 mg Oral BID  . aspirin EC  81 mg Oral Once per day on Mon Thu  . atorvastatin  10 mg Oral Daily  . carvedilol  6.25 mg Oral BID WC  . citalopram  10 mg Oral Daily  . ferrous sulfate  325 mg Oral Q breakfast  . finasteride  5 mg Oral Daily  . glipiZIDE  20 mg Oral BID AC  . insulin aspart  0-5 Units Subcutaneous QHS  . insulin aspart  0-9 Units Subcutaneous TID WC  . levothyroxine  100 mcg Oral QAC breakfast  . magnesium oxide  400 mg Oral Daily  . potassium chloride SA  20 mEq Oral BID  . sodium chloride  3 mL Intravenous Q12H   Continuous Infusions: . sodium chloride 250 mL (07/15/14 0624)   PRN Meds:.acetaminophen **OR** acetaminophen, polyethylene glycol, senna-docusate, sodium chloride,  traMADol  DVT Prophylaxis  Eliquis  Lab Results  Component Value Date   PLT 224 07/16/2014    Antibiotics     Anti-infectives    Start     Dose/Rate Route Frequency Ordered Stop   07/15/14 0130  vancomycin (VANCOCIN) IVPB 1000 mg/200 mL premix     1,000 mg200 mL/hr over 60 Minutes Intravenous  Once 07/15/14 0127 07/15/14 0244          Objective:   Filed Vitals:   07/15/14 1755 07/15/14 2141 07/15/14 2219 07/16/14 0549  BP: 127/49 141/48  154/58  Pulse:  58  64  Temp:  98.4 F (36.9 C)  98 F (36.7 C)  TempSrc:  Oral  Oral  Resp:  18 16 18   Height:      Weight:      SpO2:  98%  99%    Wt Readings from Last 3 Encounters:   07/15/14 65.5 kg (144 lb 6.4 oz)  07/12/14 68.04 kg (150 lb)  04/06/14 61.236 kg (135 lb)     Intake/Output Summary (Last 24 hours) at 07/16/14 1117 Last data filed at 07/16/14 0900  Gross per 24 hour  Intake    153 ml  Output    460 ml  Net   -307 ml     Physical Exam  Awake Alert, Oriented X 3, No new F.N deficits, Normal affect [place].AT,PERRAL Supple Neck,No JVD, No cervical lymphadenopathy appriciated. Tracheostomy site clean. Symmetrical Chest wall movement, Good air movement bilaterally, CTAB RRR,No Gallops,Rubs or new Murmurs, No Parasternal Heave +ve B.Sounds, Abd Soft, No tenderness, No organomegaly appriciated, No rebound - guarding or rigidity. No Cyanosis, Clubbing or edema, No new Rash or bruise , R 1st toe is tender and red. Not much swelling.   Data Review   Micro Results Recent Results (from the past 240 hour(s))  Urine culture     Status: None   Collection Time: 07/15/14 12:21 AM  Result Value Ref Range Status   Specimen Description URINE, CLEAN CATCH  Final   Special Requests NONE  Final   Colony Count NO GROWTH Performed at [facility]   Final   Culture NO GROWTH Performed at [facility]   Final   Report Status 07/16/2014 FINAL  Final    Radiology Reports Ct Head Wo Contrast  07/14/2014   CLINICAL DATA:  Patient was seen here 2 days ago for a fluid infection that has improved. Tonight the family found lying on the floor at home. No recollection of the fall or striking head. Confusion.  EXAM: CT HEAD WITHOUT CONTRAST  TECHNIQUE: Contiguous axial images were obtained from the base of the skull through the vertex without intravenous contrast.  COMPARISON:  03/17/2014  FINDINGS: Diffuse cerebral atrophy. Ventricular dilatation consistent with central atrophy. Low-attenuation changes in the deep white matter consistent with small vessel ischemia. Probable old encephalomalacia in the left parietal region. No change since prior study. No mass  effect or midline shift. No abnormal extra-axial fluid collections. Gray-white matter junctions are distinct. Basal cisterns are not effaced. No evidence of acute intracranial hemorrhage. No depressed skull fractures. Diffuse opacification of the left frontal sinus and ethmoid air cells with mucosal thickening in the left maxillary antrum. Postoperative changes in the left maxillary antrum. Probable polyp or retention cyst in the left anterior nasal passage. Paranasal sinus changes are improved since prior study.  IMPRESSION: No acute intracranial abnormalities. Chronic atrophy and small vessel ischemic changes. Improving inflammatory changes in the paranasal  sinuses.   Electronically Signed   By: Lucienne Capers M.D.   On: 07/14/2014 23:36   Dg Chest Port 1 View  07/15/2014   CLINICAL DATA:  Weakness and shortness of breath.  EXAM: PORTABLE CHEST - 1 VIEW  COMPARISON:  04/06/2014  FINDINGS: Postoperative changes in the mediastinum. Mild cardiac enlargement without vascular congestion. No focal airspace disease or consolidation in the lungs. No blunting of costophrenic angles. No pneumothorax.  IMPRESSION: Mild cardiac enlargement.  No evidence of active pulmonary disease.   Electronically Signed   By: Lucienne Capers M.D.   On: 07/15/2014 00:34   Dg Foot Complete Right  07/13/2014   CLINICAL DATA:  81 year old male with a history of wound on the first metatarsophalangeal joint.  EXAM: RIGHT FOOT COMPLETE - 3+ VIEW  COMPARISON:  None.  FINDINGS: No acute fracture line identified.  No bony erosions identified.  No significant soft tissue swelling.  No subcutaneous gas/emphysema.  Extensive calcifications of the anterior tibial artery and posterior tibial artery, as well as the small vessels of the foot.  Degenerative changes of the forefoot, midfoot, and hindfoot.  IMPRESSION: No acute bony abnormality identified.  Extensive vascular calcifications compatible with infrapopliteal and small vessel disease. If  the patient has not yet been evaluated for claudication/CLI, noninvasive testing with ABI, segmental duplex, and segmental pulse volume recording may be considered as well as office based evaluation.  Signed,  Dulcy Fanny. Earleen Newport, DO  [facility]  [facility]   Electronically Signed   By: Corrie Mckusick D.O.   On: 07/13/2014 00:30     CBC  Recent Labs Lab 07/12/14 2123 07/14/14 2249 07/15/14 0903 07/16/14 0615  WBC 10.1 15.5* 11.0* 9.5  HGB 11.1* 10.2* 9.7* 9.4*  HCT 33.7* 30.5* 28.5* 27.7*  PLT 269 244 219 224  MCV 94.4 94.1 95.0 94.5  MCH 31.1 31.5 32.3 32.1  MCHC 32.9 33.4 34.0 33.9  RDW 14.4 14.4 14.5 14.4  LYMPHSABS 1.2 0.9 0.8  --   MONOABS 0.9 1.3* 0.9  --   EOSABS 0.1 0.0 0.0  --   BASOSABS 0.0 0.0 0.0  --     Chemistries   Recent Labs Lab 07/12/14 2123 07/14/14 2249 07/15/14 0008 07/15/14 0903 07/16/14 0615  NA 138 132*  --  138 139  K 4.4 3.9  --  3.9 4.0  CL 98 97  --  102 106  CO2 29 25  --  26 30  GLUCOSE 216* 242*  --  159* 106*  BUN 35* 54*  --  43* 29*  CREATININE 1.25 1.40*  --  1.14 0.96  CALCIUM 8.9 8.3*  --  8.0* 8.3*  MG  --   --  0.7* 1.7  --   AST 16 37  --  28  --   ALT 15 36  --  32  --   ALKPHOS 87 79  --  75  --   BILITOT 0.7 0.8  --  0.8  --    ------------------------------------------------------------------------------------------------------------------ estimated creatinine clearance is 48.3 mL/min (by C-G formula based on Cr of 0.96). ------------------------------------------------------------------------------------------------------------------  Recent Labs  07/15/14 0903  HGBA1C 7.1*   ------------------------------------------------------------------------------------------------------------------ No results for input(s): CHOL, HDL, LDLCALC, TRIG, CHOLHDL, LDLDIRECT in the last 72  hours. ------------------------------------------------------------------------------------------------------------------ No results for input(s): TSH, T4TOTAL, T3FREE, THYROIDAB in the last 72 hours.  Invalid input(s): FREET3 ------------------------------------------------------------------------------------------------------------------ No results for input(s): VITAMINB12, FOLATE, FERRITIN, TIBC, IRON, RETICCTPCT in the last 72 hours.  Coagulation profile No results for input(s): INR, PROTIME in the last 168 hours.   Recent Labs  07/15/14 0008  DDIMER 0.37    Cardiac Enzymes No results for input(s): CKMB, TROPONINI, MYOGLOBIN in the last 168 hours.  Invalid input(s): CK ------------------------------------------------------------------------------------------------------------------ Invalid input(s): POCBNP     Time Spent in minutes   35   Aoi Kouns K M.D on 07/16/2014 at 11:17 AM  Between 7am to 7pm - Pager - [phone]  After 7pm go to [email or web address] - [facility] Office  [phone]

## 2014-07-16 NOTE — Progress Notes (Signed)
CARE MANAGEMENT NOTE 07/16/2014  Patient:  [name], [name]   Account Number:  [id number]  Date Initiated:  07/16/2014  Documentation initiated by:  Tomi Bamberger  Subjective/Objective Assessment:   dx fall, confusion, weakness  admit- from home     Action/Plan:   pt eval- no pt f/u   Anticipated DC Date:  07/17/2014   Anticipated DC Plan:  [place]  CM consult      Choice offered to / List presented to:             Status of service:  In process, will continue to follow Medicare Important Message given?  YES (If response is "NO", the following Medicare IM given date fields will be blank) Date Medicare IM given:  07/16/2014 Medicare IM given by:  Tomi Bamberger Date Additional Medicare IM given:   Additional Medicare IM given by:    Discharge Disposition:    Per UR Regulation:    If discussed at Long Length of Stay Meetings, dates discussed:    Comments:  07/16/14 [place] RN,BSN [phone] patient is from home, per physical therapy no pt f/u needed.

## 2014-07-17 LAB — GLUCOSE, CAPILLARY
GLUCOSE-CAPILLARY: 204 mg/dL — AB (ref 70–99)
Glucose-Capillary: 175 mg/dL — ABNORMAL HIGH (ref 70–99)

## 2014-07-17 MED ORDER — METHYLPREDNISOLONE SODIUM SUCC 125 MG IJ SOLR
80.0000 mg | Freq: Once | INTRAMUSCULAR | Status: AC
  Administered 2014-07-17: 80 mg via INTRAVENOUS

## 2014-07-17 MED ORDER — METHYLPREDNISOLONE 4 MG PO KIT: PACK | ORAL | Status: DC

## 2014-07-17 NOTE — Progress Notes (Signed)
Nsg Discharge Note  Admit Date:  07/14/2014 Discharge date: 07/17/2014   Tremel Setters to be D/C'd Home per MD order.  AVS completed.  Copy for chart, and copy for patient signed, and dated. Patient/caregiver able to verbalize understanding.  Discharge Medication:   Medication List    TAKE these medications        acarbose 100 MG tablet  Commonly known as:  PRECOSE  Take 100 mg by mouth 3 (three) times daily.     amiodarone 400 MG tablet  Commonly known as:  PACERONE  Take 200 mg by mouth daily.     aspirin EC 81 MG tablet  Take 81 mg by mouth 2 (two) times a week. Mondays and thursdays     atorvastatin 10 MG tablet  Commonly known as:  LIPITOR  Take 10 mg by mouth daily.     carvedilol 6.25 MG tablet  Commonly known as:  COREG  Take 6.25 mg by mouth 2 (two) times daily with a meal.     ELIQUIS 2.5 MG Tabs tablet  Generic drug:  apixaban  Take 2.5 mg by mouth 2 (two) times daily.     ferrous sulfate 325 (65 FE) MG tablet  Take 325 mg by mouth daily with breakfast.     finasteride 5 MG tablet  Commonly known as:  PROSCAR  Take 5 mg by mouth daily.     fluticasone 50 MCG/ACT nasal spray  Commonly known as:  FLONASE  Place 2 sprays into both nostrils daily.     furosemide 20 MG tablet  Commonly known as:  LASIX  Take 20 mg by mouth daily.     glipiZIDE 10 MG tablet  Commonly known as:  GLUCOTROL  Take 20 mg by mouth 2 (two) times daily before a meal.     levothyroxine 100 MCG tablet  Commonly known as:  SYNTHROID, LEVOTHROID  Take 100 mcg by mouth daily before breakfast.     lisinopril 5 MG tablet  Commonly known as:  PRINIVIL,ZESTRIL  Take 5 mg by mouth daily.     metFORMIN 500 MG tablet  Commonly known as:  GLUCOPHAGE  Take 500 mg by mouth daily.     methylPREDNISolone 4 MG tablet  Commonly known as:  MEDROL DOSEPAK  follow package directions     potassium chloride SA 20 MEQ tablet  Commonly known as:  K-DUR,KLOR-CON  Take 20 mEq by mouth 2 (two)  times daily.     senna-docusate 8.6-50 MG per tablet  Commonly known as:  Senokot-S  Take 1 tablet by mouth at bedtime as needed for mild constipation.     traMADol 50 MG tablet  Commonly known as:  ULTRAM  Take 1 tablet (50 mg total) by mouth every 6 (six) hours as needed.        Discharge Assessment: Filed Vitals:   07/17/14 0907  BP: 139/48  Pulse:   Temp:   Resp:    Skin has scattered scabs on extremities. IV catheter discontinued intact. Site without signs and symptoms of complications - no redness or edema noted at insertion site, patient denies c/o pain - only slight tenderness at site.  Dressing with slight pressure applied.  D/c Instructions-Education: Discharge instructions given to patient/family with verbalized understanding. D/c education completed with patient/family including follow up instructions, medication list, d/c activities limitations if indicated, with other d/c instructions as indicated by MD - patient able to verbalize understanding, all questions fully answered. Patient instructed to return to ED,  call 911, or call MD for any changes in condition.  Patient escorted via [place], and D/C home via private auto.  Jayton Popelka Margaretha Sheffield, RN 07/17/2014 3:01 PM

## 2014-07-17 NOTE — Discharge Summary (Signed)
[name], is a 81 y.o. male  DOB [birth year]  MRN [id number].  Admission date:  07/14/2014  Admitting Physician  Jani Gravel, MD  Discharge Date:  07/17/2014   Primary MD  Mathews Argyle, MD  Recommendations for primary care physician for things to follow:   Follow clinically.  CBC BMP next visit. Monitor diuretic dose.   Admission Diagnosis  Hypomagnesemia [E83.42] Leukocytosis [D72.829] Weakness [R53.1] Head injury [S09.90XA] Moderate dehydration [E86.0] Cellulitis of right leg [L03.115]   Discharge Diagnosis  Hypomagnesemia [E83.42] Leukocytosis [D72.829] Weakness [R53.1] Head injury [S09.90XA] Moderate dehydration [E86.0] Cellulitis of right leg [L03.115]    Principal Problem:   Cellulitis Active Problems:   Throat cancer   Hypothyroidism   CAD (coronary artery disease) of artery bypass graft   Falls   Left bundle branch block (LBBB) on electrocardiogram   Hypomagnesemia   Cellulitis of right leg   Head injury      Past Medical History  Diagnosis Date  . Coronary artery disease     s/p CABG 1996 then PCI of SVG to OM 2007  . Diverticulosis   . Dyslipidemia   . Renal artery stenosis     s/p bilateral stents  . Hiatal hernia   . Colon polyps   . Carotid artery stenosis     50% BILATERAL 2014  . Peripheral neuropathy   . Anemia   . Hypothyroidism   . Nasal polyps   . Hypertension   . Diabetes mellitus   . Throat cancer S/P TRACHECTOMY   . Myocardial infarction   . Peripheral vascular disease     LOWER EXT  . Atrial fibrillation/flutter Aug 2015  . GERD (gastroesophageal reflux disease)   . Shortness of breath     Hx: of  . HOH (hard of hearing) VERY   . Left bundle branch block (LBBB) on electrocardiogram 03/17/2014  . Ischemic cardiomyopathy Aug 2015    EF 25-30%   . History of  throat cancer     s/p trach  . Aortic stenosis Aug 2015    mild to moderate  . Chronic anticoagulation Aug 2015    Eliquis  . CHF (congestive heart failure)     Past Surgical History  Procedure Laterality Date  . Coronary artery bypass graft      1996  . Cardiac catheterization      PCI of SVG to OM 2007  . Renal artery stent    . Tracheostomy    . Appendectomy    . Tonsillectomy    . Sp pta peripheral  March 2013    Lt SFA (Dr Saundra Shelling)  . Sp pta peripheral  May 2013    Rt SFA (Dr Bridgett Larsson)  . Lower extremity angiogram [name] 08/23/2011    Procedure: LOWER EXTREMITY ANGIOGRAM;  Surgeon: [name], MD;  Location: [facility] CATH LAB;  Service: Cardiovascular;  Laterality: [name];  . Percutaneous stent intervention Left 08/23/2011    Procedure: PERCUTANEOUS STENT INTERVENTION;  Surgeon: [name], MD;  Location: [facility] CATH  LAB;  Service: Cardiovascular;  Laterality: Left;  . Lower extremity angiogram [name] 10/25/2011    Procedure: LOWER EXTREMITY ANGIOGRAM;  Surgeon: Conrad Higginsport, MD;  Location[facility] CATH LAB;  Service: Cardiovascular;  Laterality: [name];  . Left heart catheterization with coronary/graft angiogram [name] 11/07/2012    Procedure: LEFT HEART CATHETERIZATION WITH Beatrix Fetters;  Surgeon: [name], MD;  Location: [facility] CATH LAB;  Service: Cardiovascular;  Laterality: [name];  . Cardioversion [name] 03/18/2014    Procedure: CARDIOVERSION;  Surgeon: Evans Lance, MD;  Location: [facility] CATH LAB;  Service: Cardiovascular;  Laterality: [name];       History of present illness and  Hospital Course:     Kindly see H&P for history of present illness and admission details, please review complete Labs, Consult reports and Test reports for all details in brief  HPI  from the history and physical done on the day of admission  81 yo male with CAD s/p CABG, Dm2, CHF (EF 25-30%), AS, Afib, (chads2=5) Apparently fell at home and could not walk, Pt was noted to have redness of the right  distal lower ext thought to be mild cellulitis per ED, and mild leukocytosis. Pt will be admitted for FTT, mild cellultis. Upon further vision it appeared patient had a mild case of gout in the right first toe.  Hospital Course    1. Fall due to generalized weakness and dehydration. CT head stable, nonfocal exam, no headache, improved with hydration, negative orthostatics, PT done, at baseline, no sedations by PT at this time no requirements. He is eager to go home.   2. Leukocytosis. Likely due to mild gout. Improved. Chest x-ray UA stable. Afebrile.   3. History of throat cancer. Status post Tracheostomy. Site looks clean. Chest x-ray stable. No cough.   4. Ischemic cardiomyopathy with CAD left bundle branch block. EF 25%. Currently slightly dehydrated, continue aspirin, Coreg, and tinea home dose ACE/ARB.   5. ARF. Due to dehydration. Gently hydrate, resolved, resume home dose diuretic and ARB. Request PCP to monitor BMP in diuretic dose.   6. DM type II. Continue home regimen.  Lab Results  Component Value Date   HGBA1C 7.1* 07/15/2014    7. Atrial fibrillation. On amiodarone, Coreg along with Eliquis.   8. Mild metabolic encephalopathy on admission. Currently appears to be at baseline resolved. CT head stable.   9. Hyponatremia. Hypomagnesemia. Hydrate, replaced magnesium, stable.   10. Mild anemia. Outpatient age-appropriate workup.   11. Gout. Right first toe. X-ray stable. Uric acid stable. Much improved with steroids. Will place Medrol Dosepak.      Discharge Condition: stable   Follow UP  Follow-up Information    Follow up with Mathews Argyle, MD. Schedule an appointment as soon as possible for a visit in 1 week.   Specialty:  Internal Medicine   Contact information:   [address] [place] [address] [phone]         Discharge Instructions  and  Discharge Medications      Discharge Instructions    Diet - low  sodium heart healthy    Complete by:  As directed      Discharge instructions    Complete by:  As directed   Follow with Primary MD Mathews Argyle, MD in 7 days   Get CBC, CMP, 2 view Chest X ray checked  by Primary MD next visit.    Activity: As tolerated with Full fall precautions use walker/cane &  assistance as needed   Disposition Home    Diet: Heart Healthy Low Carb , with feeding assistance and aspiration precautions as needed.  For Heart failure patients - Check your Weight same time everyday, if you gain over 2 pounds, or you develop in leg swelling, experience more shortness of breath or chest pain, call your Primary MD immediately. Follow Cardiac Low Salt Diet and 1.8 lit/day fluid restriction.   On your next visit with your primary care physician please Get Medicines reviewed and adjusted.   Please request your Prim.MD to go over all Hospital Tests and Procedure/Radiological results at the follow up, please get all Hospital records sent to your Prim MD by signing hospital release before you go home.   If you experience worsening of your admission symptoms, develop shortness of breath, life threatening emergency, suicidal or homicidal thoughts you must seek medical attention immediately by calling 911 or calling your MD immediately  if symptoms less severe.  You Must read complete instructions/literature along with all the possible adverse reactions/side effects for all the Medicines you take and that have been prescribed to you. Take any new Medicines after you have completely understood and accpet all the possible adverse reactions/side effects.   Do not drive, operating heavy machinery, perform activities at heights, swimming or participation in water activities or provide baby sitting services if your were admitted for syncope or siezures until you have seen by Primary MD or a Neurologist and advised to do so again.  Do not drive when taking Pain medications.     Do not take more than prescribed Pain, Sleep and Anxiety Medications  Special Instructions: If you have smoked or chewed Tobacco  in the last 2 yrs please stop smoking, stop any regular Alcohol  and or any Recreational drug use.  Wear Seat belts while driving.   Please note  You were cared for by a hospitalist during your hospital stay. If you have any questions about your discharge medications or the care you received while you were in the hospital after you are discharged, you can call the unit and asked to speak with the hospitalist on call if the hospitalist that took care of you is not available. Once you are discharged, your primary care physician will handle any further medical issues. Please note that NO REFILLS for any discharge medications will be authorized once you are discharged, as it is imperative that you return to your primary care physician (or establish a relationship with a primary care physician if you do not have one) for your aftercare needs so that they can reassess your need for medications and monitor your lab values.     Increase activity slowly    Complete by:  As directed             Medication List    TAKE these medications        acarbose 100 MG tablet  Commonly known as:  PRECOSE  Take 100 mg by mouth 3 (three) times daily.     amiodarone 400 MG tablet  Commonly known as:  PACERONE  Take 200 mg by mouth daily.     aspirin EC 81 MG tablet  Take 81 mg by mouth 2 (two) times a week. Mondays and thursdays     atorvastatin 10 MG tablet  Commonly known as:  LIPITOR  Take 10 mg by mouth daily.     carvedilol 6.25 MG tablet  Commonly known as:  COREG  Take 6.25 mg  by mouth 2 (two) times daily with a meal.     ELIQUIS 2.5 MG Tabs tablet  Generic drug:  apixaban  Take 2.5 mg by mouth 2 (two) times daily.     ferrous sulfate 325 (65 FE) MG tablet  Take 325 mg by mouth daily with breakfast.     finasteride 5 MG tablet  Commonly known as:   PROSCAR  Take 5 mg by mouth daily.     fluticasone 50 MCG/ACT nasal spray  Commonly known as:  FLONASE  Place 2 sprays into both nostrils daily.     furosemide 20 MG tablet  Commonly known as:  LASIX  Take 20 mg by mouth daily.     glipiZIDE 10 MG tablet  Commonly known as:  GLUCOTROL  Take 20 mg by mouth 2 (two) times daily before a meal.     levothyroxine 100 MCG tablet  Commonly known as:  SYNTHROID, LEVOTHROID  Take 100 mcg by mouth daily before breakfast.     lisinopril 5 MG tablet  Commonly known as:  PRINIVIL,ZESTRIL  Take 5 mg by mouth daily.     metFORMIN 500 MG tablet  Commonly known as:  GLUCOPHAGE  Take 500 mg by mouth daily.     methylPREDNISolone 4 MG tablet  Commonly known as:  MEDROL DOSEPAK  follow package directions     potassium chloride SA 20 MEQ tablet  Commonly known as:  K-DUR,KLOR-CON  Take 20 mEq by mouth 2 (two) times daily.     senna-docusate 8.6-50 MG per tablet  Commonly known as:  Senokot-S  Take 1 tablet by mouth at bedtime as needed for mild constipation.     traMADol 50 MG tablet  Commonly known as:  ULTRAM  Take 1 tablet (50 mg total) by mouth every 6 (six) hours as needed.          Diet and Activity recommendation: See Discharge Instructions above   Consults obtained - none   Major procedures and Radiology Reports - PLEASE review detailed and final reports for all details, in brief -       Ct Head Wo Contrast  07/14/2014   CLINICAL DATA:  Patient was seen here 2 days ago for a fluid infection that has improved. Tonight the family found lying on the floor at home. No recollection of the fall or striking head. Confusion.  EXAM: CT HEAD WITHOUT CONTRAST  TECHNIQUE: Contiguous axial images were obtained from the base of the skull through the vertex without intravenous contrast.  COMPARISON:  03/17/2014  FINDINGS: Diffuse cerebral atrophy. Ventricular dilatation consistent with central atrophy. Low-attenuation changes in the  deep white matter consistent with small vessel ischemia. Probable old encephalomalacia in the left parietal region. No change since prior study. No mass effect or midline shift. No abnormal extra-axial fluid collections. Gray-white matter junctions are distinct. Basal cisterns are not effaced. No evidence of acute intracranial hemorrhage. No depressed skull fractures. Diffuse opacification of the left frontal sinus and ethmoid air cells with mucosal thickening in the left maxillary antrum. Postoperative changes in the left maxillary antrum. Probable polyp or retention cyst in the left anterior nasal passage. Paranasal sinus changes are improved since prior study.  IMPRESSION: No acute intracranial abnormalities. Chronic atrophy and small vessel ischemic changes. Improving inflammatory changes in the paranasal sinuses.   Electronically Signed   By: Lucienne Capers M.D.   On: 07/14/2014 23:36   Dg Chest Port 1 View  07/15/2014   CLINICAL DATA:  Weakness  and shortness of breath.  EXAM: PORTABLE CHEST - 1 VIEW  COMPARISON:  04/06/2014  FINDINGS: Postoperative changes in the mediastinum. Mild cardiac enlargement without vascular congestion. No focal airspace disease or consolidation in the lungs. No blunting of costophrenic angles. No pneumothorax.  IMPRESSION: Mild cardiac enlargement.  No evidence of active pulmonary disease.   Electronically Signed   By: Lucienne Capers M.D.   On: 07/15/2014 00:34   Dg Foot Complete Right  07/16/2014   CLINICAL DATA:  Acute right foot pain for 1 day. No known injury. Initial encounter.  EXAM: RIGHT FOOT COMPLETE - 3+ VIEW  COMPARISON:  July 13, 2014.  FINDINGS: There is no evidence of fracture or dislocation. There is no evidence of arthropathy or other focal bone abnormality. Vascular calcifications are noted. No radiopaque foreign body seen.  IMPRESSION: No significant abnormality seen involving the right foot.   Electronically Signed   By: Sabino Dick M.D.   On:  07/16/2014 12:52   Dg Foot Complete Right  07/13/2014   CLINICAL DATA:  81 year old male with a history of wound on the first metatarsophalangeal joint.  EXAM: RIGHT FOOT COMPLETE - 3+ VIEW  COMPARISON:  None.  FINDINGS: No acute fracture line identified.  No bony erosions identified.  No significant soft tissue swelling.  No subcutaneous gas/emphysema.  Extensive calcifications of the anterior tibial artery and posterior tibial artery, as well as the small vessels of the foot.  Degenerative changes of the forefoot, midfoot, and hindfoot.  IMPRESSION: No acute bony abnormality identified.  Extensive vascular calcifications compatible with infrapopliteal and small vessel disease. If the patient has not yet been evaluated for claudication/CLI, noninvasive testing with ABI, segmental duplex, and segmental pulse volume recording may be considered as well as office based evaluation.  Signed,  Dulcy Fanny. Earleen Newport, DO  [facility]  [facility]   Electronically Signed   By: Corrie Mckusick D.O.   On: 07/13/2014 00:30    Micro Results      Recent Results (from the past 240 hour(s))  Urine culture     Status: None   Collection Time: 07/15/14 12:21 AM  Result Value Ref Range Status   Specimen Description URINE, CLEAN CATCH  Final   Special Requests NONE  Final   Colony Count NO GROWTH Performed at [facility]   Final   Culture NO GROWTH Performed at [facility]   Final   Report Status 07/16/2014 FINAL  Final       Today   Subjective:   [name] today has no headache,no chest abdominal pain,no new weakness tingling or numbness, feels much better wants to go home today.    Objective:   Blood pressure 139/48, pulse 59, temperature 98.7 F (37.1 C), temperature source Oral, resp. rate 20, height 5\' 8"  (1.727 m), weight 65.5 kg (144 lb 6.4 oz), SpO2 100 %.   Intake/Output Summary (Last 24 hours) at 07/17/14 1132 Last data filed  at 07/17/14 0604  Gross per 24 hour  Intake    238 ml  Output   1080 ml  Net   -842 ml    Exam Awake Alert, Oriented x 3, No new F.N deficits, Normal affect [place].AT,PERRAL Supple Neck,No JVD, No cervical lymphadenopathy appriciated.  Symmetrical Chest wall movement, Good air movement bilaterally, CTAB RRR,No Gallops,Rubs or new Murmurs, No Parasternal Heave +ve B.Sounds, Abd Soft, Non tender, No organomegaly appriciated, No rebound -guarding or rigidity. No Cyanosis, Clubbing or edema, No  new Rash or bruise, right first toe redness much improved.  Data Review   CBC w Diff: Lab Results  Component Value Date   WBC 9.5 07/16/2014   HGB 9.4* 07/16/2014   HCT 27.7* 07/16/2014   PLT 224 07/16/2014   LYMPHOPCT 7* 07/15/2014   MONOPCT 8 07/15/2014   EOSPCT 0 07/15/2014   BASOPCT 0 07/15/2014    CMP: Lab Results  Component Value Date   NA 139 07/16/2014   K 4.0 07/16/2014   CL 106 07/16/2014   CO2 30 07/16/2014   BUN 29* 07/16/2014   CREATININE 0.96 07/16/2014   PROT 5.8* 07/15/2014   ALBUMIN 2.9* 07/15/2014   BILITOT 0.8 07/15/2014   ALKPHOS 75 07/15/2014   AST 28 07/15/2014   ALT 32 07/15/2014  .   Total Time in preparing paper work, data evaluation and todays exam - 35 minutes  Thurnell Lose M.D on 07/17/2014 at 11:32 AM  [facility] Office  [phone]

## 2014-07-24 ENCOUNTER — Encounter (HOSPITAL_COMMUNITY): Payer: Self-pay | Admitting: *Deleted

## 2014-07-24 ENCOUNTER — Inpatient Hospital Stay (HOSPITAL_COMMUNITY)
Admission: EM | Admit: 2014-07-24 | Discharge: 2014-08-02 | DRG: 300 | Disposition: A | Discharge status: Hospice | Payer: Medicare Other | Attending: Internal Medicine | Admitting: Internal Medicine

## 2014-07-24 ENCOUNTER — Emergency Department (HOSPITAL_COMMUNITY): Payer: Medicare Other

## 2014-07-24 DIAGNOSIS — Z930 Tracheostomy status: Secondary | ICD-10-CM | POA: Diagnosis not present

## 2014-07-24 DIAGNOSIS — I2510 Atherosclerotic heart disease of native coronary artery without angina pectoris: Secondary | ICD-10-CM | POA: Diagnosis present

## 2014-07-24 DIAGNOSIS — R64 Cachexia: Secondary | ICD-10-CM | POA: Diagnosis present

## 2014-07-24 DIAGNOSIS — I739 Peripheral vascular disease, unspecified: Secondary | ICD-10-CM | POA: Diagnosis present

## 2014-07-24 DIAGNOSIS — F05 Delirium due to known physiological condition: Secondary | ICD-10-CM | POA: Diagnosis present

## 2014-07-24 DIAGNOSIS — C140 Malignant neoplasm of pharynx, unspecified: Secondary | ICD-10-CM | POA: Diagnosis present

## 2014-07-24 DIAGNOSIS — Z515 Encounter for palliative care: Secondary | ICD-10-CM | POA: Diagnosis not present

## 2014-07-24 DIAGNOSIS — L97519 Non-pressure chronic ulcer of other part of right foot with unspecified severity: Secondary | ICD-10-CM | POA: Diagnosis present

## 2014-07-24 DIAGNOSIS — E11649 Type 2 diabetes mellitus with hypoglycemia without coma: Secondary | ICD-10-CM | POA: Diagnosis present

## 2014-07-24 DIAGNOSIS — Z87891 Personal history of nicotine dependence: Secondary | ICD-10-CM | POA: Diagnosis not present

## 2014-07-24 DIAGNOSIS — I447 Left bundle-branch block, unspecified: Secondary | ICD-10-CM | POA: Diagnosis present

## 2014-07-24 DIAGNOSIS — Z6821 Body mass index (BMI) 21.0-21.9, adult: Secondary | ICD-10-CM | POA: Diagnosis not present

## 2014-07-24 DIAGNOSIS — M79671 Pain in right foot: Secondary | ICD-10-CM | POA: Diagnosis present

## 2014-07-24 DIAGNOSIS — Z7901 Long term (current) use of anticoagulants: Secondary | ICD-10-CM

## 2014-07-24 DIAGNOSIS — Z66 Do not resuscitate: Secondary | ICD-10-CM | POA: Diagnosis present

## 2014-07-24 DIAGNOSIS — D638 Anemia in other chronic diseases classified elsewhere: Secondary | ICD-10-CM | POA: Diagnosis present

## 2014-07-24 DIAGNOSIS — G629 Polyneuropathy, unspecified: Secondary | ICD-10-CM | POA: Diagnosis present

## 2014-07-24 DIAGNOSIS — L97509 Non-pressure chronic ulcer of other part of unspecified foot with unspecified severity: Secondary | ICD-10-CM

## 2014-07-24 DIAGNOSIS — E785 Hyperlipidemia, unspecified: Secondary | ICD-10-CM | POA: Diagnosis present

## 2014-07-24 DIAGNOSIS — E1152 Type 2 diabetes mellitus with diabetic peripheral angiopathy with gangrene: Secondary | ICD-10-CM | POA: Diagnosis present

## 2014-07-24 DIAGNOSIS — I5022 Chronic systolic (congestive) heart failure: Secondary | ICD-10-CM | POA: Diagnosis present

## 2014-07-24 DIAGNOSIS — I255 Ischemic cardiomyopathy: Secondary | ICD-10-CM | POA: Diagnosis present

## 2014-07-24 DIAGNOSIS — I350 Nonrheumatic aortic (valve) stenosis: Secondary | ICD-10-CM | POA: Diagnosis present

## 2014-07-24 DIAGNOSIS — Z951 Presence of aortocoronary bypass graft: Secondary | ICD-10-CM | POA: Diagnosis not present

## 2014-07-24 DIAGNOSIS — Z7982 Long term (current) use of aspirin: Secondary | ICD-10-CM

## 2014-07-24 DIAGNOSIS — H9190 Unspecified hearing loss, unspecified ear: Secondary | ICD-10-CM | POA: Diagnosis present

## 2014-07-24 DIAGNOSIS — Z6820 Body mass index (BMI) 20.0-20.9, adult: Secondary | ICD-10-CM

## 2014-07-24 DIAGNOSIS — Z794 Long term (current) use of insulin: Secondary | ICD-10-CM | POA: Diagnosis not present

## 2014-07-24 DIAGNOSIS — E039 Hypothyroidism, unspecified: Secondary | ICD-10-CM | POA: Diagnosis present

## 2014-07-24 DIAGNOSIS — E11621 Type 2 diabetes mellitus with foot ulcer: Secondary | ICD-10-CM | POA: Diagnosis present

## 2014-07-24 DIAGNOSIS — I10 Essential (primary) hypertension: Secondary | ICD-10-CM | POA: Diagnosis present

## 2014-07-24 DIAGNOSIS — F0390 Unspecified dementia without behavioral disturbance: Secondary | ICD-10-CM | POA: Diagnosis present

## 2014-07-24 DIAGNOSIS — E13621 Other specified diabetes mellitus with foot ulcer: Secondary | ICD-10-CM

## 2014-07-24 DIAGNOSIS — I480 Paroxysmal atrial fibrillation: Secondary | ICD-10-CM | POA: Diagnosis present

## 2014-07-24 DIAGNOSIS — Z85819 Personal history of malignant neoplasm of unspecified site of lip, oral cavity, and pharynx: Secondary | ICD-10-CM

## 2014-07-24 DIAGNOSIS — R410 Disorientation, unspecified: Secondary | ICD-10-CM

## 2014-07-24 DIAGNOSIS — I6523 Occlusion and stenosis of bilateral carotid arteries: Secondary | ICD-10-CM | POA: Diagnosis present

## 2014-07-24 DIAGNOSIS — E1165 Type 2 diabetes mellitus with hyperglycemia: Secondary | ICD-10-CM | POA: Diagnosis present

## 2014-07-24 DIAGNOSIS — L03115 Cellulitis of right lower limb: Secondary | ICD-10-CM | POA: Diagnosis present

## 2014-07-24 DIAGNOSIS — I429 Cardiomyopathy, unspecified: Secondary | ICD-10-CM

## 2014-07-24 DIAGNOSIS — I252 Old myocardial infarction: Secondary | ICD-10-CM

## 2014-07-24 DIAGNOSIS — E119 Type 2 diabetes mellitus without complications: Secondary | ICD-10-CM

## 2014-07-24 DIAGNOSIS — D649 Anemia, unspecified: Secondary | ICD-10-CM

## 2014-07-24 DIAGNOSIS — L0390 Cellulitis, unspecified: Secondary | ICD-10-CM | POA: Diagnosis present

## 2014-07-24 LAB — CBC WITH DIFFERENTIAL/PLATELET
Basophils Absolute: 0 10*3/uL (ref 0.0–0.1)
Basophils Relative: 0 % (ref 0–1)
Eosinophils Absolute: 0 10*3/uL (ref 0.0–0.7)
Eosinophils Relative: 0 % (ref 0–5)
HEMATOCRIT: 36.4 % — AB (ref 39.0–52.0)
HEMOGLOBIN: 12.3 g/dL — AB (ref 13.0–17.0)
LYMPHS ABS: 0.7 10*3/uL (ref 0.7–4.0)
Lymphocytes Relative: 4 % — ABNORMAL LOW (ref 12–46)
MCH: 31.4 pg (ref 26.0–34.0)
MCHC: 33.8 g/dL (ref 30.0–36.0)
MCV: 92.9 fL (ref 78.0–100.0)
MONO ABS: 0.8 10*3/uL (ref 0.1–1.0)
Monocytes Relative: 4 % (ref 3–12)
NEUTROS PCT: 92 % — AB (ref 43–77)
Neutro Abs: 17 10*3/uL — ABNORMAL HIGH (ref 1.7–7.7)
Platelets: 408 10*3/uL — ABNORMAL HIGH (ref 150–400)
RBC: 3.92 MIL/uL — ABNORMAL LOW (ref 4.22–5.81)
RDW: 14 % (ref 11.5–15.5)
WBC: 18.4 10*3/uL — AB (ref 4.0–10.5)

## 2014-07-24 LAB — URINALYSIS, ROUTINE W REFLEX MICROSCOPIC
Bilirubin Urine: NEGATIVE
Glucose, UA: NEGATIVE mg/dL
Hgb urine dipstick: NEGATIVE
Ketones, ur: NEGATIVE mg/dL
Leukocytes, UA: NEGATIVE
NITRITE: NEGATIVE
PROTEIN: NEGATIVE mg/dL
Specific Gravity, Urine: 1.014 (ref 1.005–1.030)
UROBILINOGEN UA: 0.2 mg/dL (ref 0.0–1.0)
pH: 5 (ref 5.0–8.0)

## 2014-07-24 LAB — COMPREHENSIVE METABOLIC PANEL
ALT: 19 U/L (ref 0–53)
AST: 15 U/L (ref 0–37)
Albumin: 3.2 g/dL — ABNORMAL LOW (ref 3.5–5.2)
Alkaline Phosphatase: 95 U/L (ref 39–117)
Anion gap: 9 (ref 5–15)
BUN: 33 mg/dL — ABNORMAL HIGH (ref 6–23)
CO2: 31 mmol/L (ref 19–32)
CREATININE: 1.25 mg/dL (ref 0.50–1.35)
Calcium: 9 mg/dL (ref 8.4–10.5)
Chloride: 98 mmol/L (ref 96–112)
GFR calc Af Amer: 57 mL/min — ABNORMAL LOW (ref >90)
GFR, EST NON AFRICAN AMERICAN: 49 mL/min — AB (ref >90)
Glucose, Bld: 192 mg/dL — ABNORMAL HIGH (ref 70–99)
Potassium: 4.3 mmol/L (ref 3.5–5.1)
Sodium: 138 mmol/L (ref 135–145)
Total Bilirubin: 0.5 mg/dL (ref 0.3–1.2)
Total Protein: 6.8 g/dL (ref 6.0–8.3)

## 2014-07-24 LAB — I-STAT CG4 LACTIC ACID, ED: LACTIC ACID, VENOUS: 1.41 mmol/L (ref 0.5–2.0)

## 2014-07-24 LAB — GLUCOSE, CAPILLARY: GLUCOSE-CAPILLARY: 286 mg/dL — AB (ref 70–99)

## 2014-07-24 MED ORDER — SODIUM CHLORIDE 0.9 % IJ SOLN
3.0000 mL | Freq: Two times a day (BID) | INTRAMUSCULAR | Status: DC
  Administered 2014-07-24: 3 mL via INTRAVENOUS

## 2014-07-24 MED ORDER — GLIPIZIDE 10 MG PO TABS
20.0000 mg | ORAL_TABLET | Freq: Two times a day (BID) | ORAL | Status: DC
  Administered 2014-07-25: 20 mg via ORAL
  Filled 2014-07-24 (×3): qty 2

## 2014-07-24 MED ORDER — METFORMIN HCL 500 MG PO TABS: 500.0000 mg | ORAL_TABLET | Freq: Every day | ORAL | Status: DC

## 2014-07-24 MED ORDER — SENNOSIDES-DOCUSATE SODIUM 8.6-50 MG PO TABS: 1.0000 | ORAL_TABLET | Freq: Every evening | ORAL | Status: DC | PRN

## 2014-07-24 MED ORDER — INSULIN ASPART 100 UNIT/ML ~~LOC~~ SOLN
0.0000 [IU] | Freq: Every day | SUBCUTANEOUS | Status: DC
  Administered 2014-07-24: 3 [IU] via SUBCUTANEOUS

## 2014-07-24 MED ORDER — SODIUM CHLORIDE 0.9 % IJ SOLN: 3.0000 mL | INTRAMUSCULAR | Status: DC | PRN

## 2014-07-24 MED ORDER — LEVOTHYROXINE SODIUM 100 MCG PO TABS
100.0000 ug | ORAL_TABLET | Freq: Every day | ORAL | Status: DC
  Administered 2014-07-25 – 2014-07-31 (×7): 100 ug via ORAL
  Filled 2014-07-24 (×9): qty 1

## 2014-07-24 MED ORDER — FERROUS SULFATE 325 (65 FE) MG PO TABS
325.0000 mg | ORAL_TABLET | Freq: Every day | ORAL | Status: DC
  Administered 2014-07-25 – 2014-07-31 (×7): 325 mg via ORAL
  Filled 2014-07-24 (×8): qty 1

## 2014-07-24 MED ORDER — SODIUM CHLORIDE 0.9 % IV SOLN
250.0000 mL | INTRAVENOUS | Status: DC | PRN
  Administered 2014-07-30: 250 mL via INTRAVENOUS

## 2014-07-24 MED ORDER — SODIUM CHLORIDE 0.9 % IJ SOLN
3.0000 mL | Freq: Two times a day (BID) | INTRAMUSCULAR | Status: DC
  Administered 2014-07-24 – 2014-07-25 (×2): 3 mL via INTRAVENOUS

## 2014-07-24 MED ORDER — CARVEDILOL 6.25 MG PO TABS
6.2500 mg | ORAL_TABLET | Freq: Two times a day (BID) | ORAL | Status: DC
  Administered 2014-07-25 – 2014-07-28 (×7): 6.25 mg via ORAL
  Filled 2014-07-24 (×9): qty 1

## 2014-07-24 MED ORDER — APIXABAN 2.5 MG PO TABS
2.5000 mg | ORAL_TABLET | Freq: Two times a day (BID) | ORAL | Status: DC
  Administered 2014-07-24 – 2014-07-27 (×6): 2.5 mg via ORAL
  Filled 2014-07-24 (×8): qty 1

## 2014-07-24 MED ORDER — ATORVASTATIN CALCIUM 10 MG PO TABS
10.0000 mg | ORAL_TABLET | Freq: Every day | ORAL | Status: DC
  Administered 2014-07-25 – 2014-07-31 (×7): 10 mg via ORAL
  Filled 2014-07-24 (×8): qty 1

## 2014-07-24 MED ORDER — LISINOPRIL 5 MG PO TABS
5.0000 mg | ORAL_TABLET | Freq: Every day | ORAL | Status: DC
  Administered 2014-07-25 – 2014-07-27 (×3): 5 mg via ORAL
  Filled 2014-07-24 (×4): qty 1

## 2014-07-24 MED ORDER — FLUTICASONE PROPIONATE 50 MCG/ACT NA SUSP
2.0000 | Freq: Every day | NASAL | Status: DC
  Administered 2014-07-29 – 2014-07-31 (×2): 2 via NASAL
  Filled 2014-07-24 (×2): qty 16

## 2014-07-24 MED ORDER — AMIODARONE HCL 200 MG PO TABS
200.0000 mg | ORAL_TABLET | Freq: Every day | ORAL | Status: DC
  Administered 2014-07-25 – 2014-07-31 (×7): 200 mg via ORAL
  Filled 2014-07-24 (×8): qty 1

## 2014-07-24 MED ORDER — ACARBOSE 100 MG PO TABS
100.0000 mg | ORAL_TABLET | Freq: Three times a day (TID) | ORAL | Status: DC
  Administered 2014-07-25 – 2014-07-27 (×8): 100 mg via ORAL
  Filled 2014-07-24 (×14): qty 1

## 2014-07-24 MED ORDER — ASPIRIN EC 81 MG PO TBEC
81.0000 mg | DELAYED_RELEASE_TABLET | ORAL | Status: DC
  Administered 2014-07-26 – 2014-07-29 (×2): 81 mg via ORAL
  Filled 2014-07-24 (×3): qty 1

## 2014-07-24 MED ORDER — METFORMIN HCL 500 MG PO TABS
500.0000 mg | ORAL_TABLET | Freq: Every day | ORAL | Status: DC
  Administered 2014-07-25: 500 mg via ORAL
  Filled 2014-07-24: qty 1

## 2014-07-24 MED ORDER — ACETAMINOPHEN 650 MG RE SUPP: 650.0000 mg | Freq: Four times a day (QID) | RECTAL | Status: DC | PRN

## 2014-07-24 MED ORDER — POTASSIUM CHLORIDE CRYS ER 20 MEQ PO TBCR
20.0000 meq | EXTENDED_RELEASE_TABLET | Freq: Two times a day (BID) | ORAL | Status: DC
  Administered 2014-07-24 – 2014-07-27 (×6): 20 meq via ORAL
  Filled 2014-07-24 (×8): qty 1

## 2014-07-24 MED ORDER — PIPERACILLIN-TAZOBACTAM 3.375 G IVPB
3.3750 g | Freq: Three times a day (TID) | INTRAVENOUS | Status: DC
  Administered 2014-07-24 – 2014-07-27 (×7): 3.375 g via INTRAVENOUS
  Filled 2014-07-24 (×10): qty 50

## 2014-07-24 MED ORDER — INSULIN ASPART 100 UNIT/ML ~~LOC~~ SOLN
0.0000 [IU] | Freq: Three times a day (TID) | SUBCUTANEOUS | Status: DC
  Administered 2014-07-25: 1 [IU] via SUBCUTANEOUS
  Administered 2014-07-25: 2 [IU] via SUBCUTANEOUS
  Administered 2014-07-25 – 2014-07-26 (×2): 3 [IU] via SUBCUTANEOUS
  Administered 2014-07-26: 9 [IU] via SUBCUTANEOUS
  Administered 2014-07-27: 3 [IU] via SUBCUTANEOUS
  Administered 2014-07-27: 5 [IU] via SUBCUTANEOUS
  Administered 2014-07-27: 7 [IU] via SUBCUTANEOUS
  Administered 2014-07-28: 3 [IU] via SUBCUTANEOUS
  Administered 2014-07-28: 2 [IU] via SUBCUTANEOUS
  Administered 2014-07-28: 5 [IU] via SUBCUTANEOUS
  Administered 2014-07-29: 7 [IU] via SUBCUTANEOUS
  Administered 2014-07-29: 2 [IU] via SUBCUTANEOUS
  Administered 2014-07-29: 9 [IU] via SUBCUTANEOUS
  Administered 2014-07-30: 2 [IU] via SUBCUTANEOUS
  Administered 2014-07-30 – 2014-07-31 (×2): 3 [IU] via SUBCUTANEOUS
  Administered 2014-07-31: 5 [IU] via SUBCUTANEOUS

## 2014-07-24 MED ORDER — VANCOMYCIN HCL IN DEXTROSE 750-5 MG/150ML-% IV SOLN
750.0000 mg | INTRAVENOUS | Status: DC
  Administered 2014-07-24 – 2014-07-26 (×3): 750 mg via INTRAVENOUS
  Filled 2014-07-24 (×4): qty 150

## 2014-07-24 MED ORDER — TRAMADOL HCL 50 MG PO TABS
50.0000 mg | ORAL_TABLET | Freq: Four times a day (QID) | ORAL | Status: DC | PRN
  Administered 2014-07-25 – 2014-07-31 (×5): 50 mg via ORAL
  Filled 2014-07-24 (×5): qty 1

## 2014-07-24 MED ORDER — FINASTERIDE 5 MG PO TABS
5.0000 mg | ORAL_TABLET | Freq: Every day | ORAL | Status: DC
  Administered 2014-07-25 – 2014-08-02 (×9): 5 mg via ORAL
  Filled 2014-07-24 (×10): qty 1

## 2014-07-24 MED ORDER — FUROSEMIDE 20 MG PO TABS
20.0000 mg | ORAL_TABLET | Freq: Every day | ORAL | Status: DC
  Administered 2014-07-25 – 2014-07-30 (×6): 20 mg via ORAL
  Filled 2014-07-24 (×7): qty 1

## 2014-07-24 MED ORDER — ACETAMINOPHEN 325 MG PO TABS
650.0000 mg | ORAL_TABLET | Freq: Four times a day (QID) | ORAL | Status: DC | PRN
  Administered 2014-07-26 – 2014-07-28 (×3): 650 mg via ORAL
  Filled 2014-07-24 (×3): qty 2

## 2014-07-24 NOTE — ED Notes (Signed)
Prior to transfer pt told RN he was concerned that trach fell down into his trach stoma; Pt has a stoma present with cloth shield covering; Pt is unable to say if he had trach on arrival to ED or not; Respiratory assessed pt and did not see evidence of trach falling into stoma; pt sats at 100% RA; Md paged to Respiratory for consult; Pt is in NO respiratory distress at this time; Stoma is clean and patent to the eye;

## 2014-07-24 NOTE — ED Notes (Signed)
Dr. Kim at bedside   

## 2014-07-24 NOTE — Progress Notes (Signed)
ANTIBIOTIC CONSULT NOTE - INITIAL  Pharmacy Consult for Vancomycin and Zosyn Indication: cellulitis  No Known Allergies  Patient Measurements: Weight: 145 lb (65.772 kg) Adjusted Body Weight: 65.8 kg  Vital Signs: Temp: 97.6 F (36.4 C) (02/06 1527) Temp Source: Oral (02/06 1527) BP: 114/37 mmHg (02/06 1527) Pulse Rate: 74 (02/06 1527) Intake/Output from previous day:   Intake/Output from this shift:    Labs:  Recent Labs  07/24/14 1552  WBC 18.4*  HGB 12.3*  PLT 408*  CREATININE 1.25   Estimated Creatinine Clearance: 37.3 mL/min (by C-G formula based on Cr of 1.25). No results for input(s): VANCOTROUGH, VANCOPEAK, VANCORANDOM, GENTTROUGH, GENTPEAK, GENTRANDOM, TOBRATROUGH, TOBRAPEAK, TOBRARND, AMIKACINPEAK, AMIKACINTROU, AMIKACIN in the last 72 hours.   Microbiology: Recent Results (from the past 720 hour(s))  Urine culture     Status: None   Collection Time: 07/15/14 12:21 AM  Result Value Ref Range Status   Specimen Description URINE, CLEAN CATCH  Final   Special Requests NONE  Final   Colony Count NO GROWTH Performed at [facility]   Final   Culture NO GROWTH Performed at [facility]   Final   Report Status 07/16/2014 FINAL  Final    Medical History: Past Medical History  Diagnosis Date  . Coronary artery disease     s/p CABG 1996 then PCI of SVG to OM 2007  . Diverticulosis   . Dyslipidemia   . Renal artery stenosis     s/p bilateral stents  . Hiatal hernia   . Colon polyps   . Carotid artery stenosis     50% BILATERAL 2014  . Peripheral neuropathy   . Anemia   . Hypothyroidism   . Nasal polyps   . Hypertension   . Diabetes mellitus   . Throat cancer S/P TRACHECTOMY   . Myocardial infarction   . Peripheral vascular disease     LOWER EXT  . Atrial fibrillation/flutter Aug 2015  . GERD (gastroesophageal reflux disease)   . Shortness of breath     Hx: of  . HOH (hard of hearing) VERY   . Left bundle branch block (LBBB)  on electrocardiogram 03/17/2014  . Ischemic cardiomyopathy Aug 2015    EF 25-30%   . History of throat cancer     s/p trach  . Aortic stenosis Aug 2015    mild to moderate  . Chronic anticoagulation Aug 2015    Eliquis  . CHF (congestive heart failure)     Assessment: 81 yo male admitted with cellulitis of R foot.  Pharmacy asked to begin empiric vancomycin and Zosyn.  Scr  1.25, est CrCl ~ 37 ml/min.   Goal of Therapy:  Vancomycin trough level 10-15 mcg/ml  Plan:  1. Zosyn 3.375 g IV q 8 hrs. 2. Vancomycin 750 mg IV q 24 hrs. 3. F/u vancomycin trough level at steady state as needed. 4. F/u renal function and cultures.  Uvaldo Rising, BCPS  Clinical Pharmacist Pager [phone]  07/24/2014 6:40 PM

## 2014-07-24 NOTE — ED Notes (Signed)
Spoke with Joanne,RN 6N about pt concerns about trach; RN approved pt to come to floor; Md will be paged once pt arrives to floor

## 2014-07-24 NOTE — ED Notes (Signed)
Md approved pt able to eat; Diabetic diet tray ordered

## 2014-07-24 NOTE — ED Notes (Signed)
Called Son Lynnette Caffey on cell at [phone]; Son states that he is not 100% sure pt did not have trach piece in prior to arrival however pt has appoint in a couple of days to be fitted for new trach; Son states pt is a little confused; Updated Son on room assignment for pt;

## 2014-07-24 NOTE — H&P (Addendum)
[name] is an 81 y.o. male.    Hal Stoneking (pcp) Casandra Doffing (cardiologist)  Chief Complaint:  cellulitis HPI: 81 yo male with hx of CAD s/p CABG, Aortic stenosis, Pafib, CHF (EF 25-30%), PVD, apparently c/o skin ulcer on the right foot since at least 4 days ago,  Seen by pcp 3 days ago Thursday and started on po abx.  Pt called physician on call today to see if needed to come to ER and was told to go for evaluation.  In ED,  Physician thought that this was cellulitis unresponsive to po abx, and requested admission.  Xray => negative for osteomyelitis.  Wbc 18.4.   Pt will be admitted for cellulitis. (note son says that pt likes to walk on wound)  Past Medical History  Diagnosis Date  . Coronary artery disease     s/p CABG 1996 then PCI of SVG to OM 2007  . Diverticulosis   . Dyslipidemia   . Renal artery stenosis     s/p bilateral stents  . Hiatal hernia   . Colon polyps   . Carotid artery stenosis     50% BILATERAL 2014  . Peripheral neuropathy   . Anemia   . Hypothyroidism   . Nasal polyps   . Hypertension   . Diabetes mellitus   . Throat cancer S/P TRACHECTOMY   . Myocardial infarction   . Peripheral vascular disease     LOWER EXT  . Atrial fibrillation/flutter Aug 2015  . GERD (gastroesophageal reflux disease)   . Shortness of breath     Hx: of  . HOH (hard of hearing) VERY   . Left bundle branch block (LBBB) on electrocardiogram 03/17/2014  . Ischemic cardiomyopathy Aug 2015    EF 25-30%   . History of throat cancer     s/p trach  . Aortic stenosis Aug 2015    mild to moderate  . Chronic anticoagulation Aug 2015    Eliquis  . CHF (congestive heart failure)     Past Surgical History  Procedure Laterality Date  . Coronary artery bypass graft      1996  . Cardiac catheterization      PCI of SVG to OM 2007  . Renal artery stent    . Tracheostomy    . Appendectomy    . Tonsillectomy    . Sp pta peripheral  March 2013    Lt SFA (Dr Saundra Shelling)  . Sp pta  peripheral  May 2013    Rt SFA (Dr Bridgett Larsson)  . Lower extremity angiogram [name] 08/23/2011    Procedure: LOWER EXTREMITY ANGIOGRAM;  Surgeon: [name], MD;  Location: [facility] CATH LAB;  Service: Cardiovascular;  Laterality: [name];  . Percutaneous stent intervention Left 08/23/2011    Procedure: PERCUTANEOUS STENT INTERVENTION;  Surgeon: [name], MD;  Location: [facility] CATH LAB;  Service: Cardiovascular;  Laterality: Left;  . Lower extremity angiogram [name] 10/25/2011    Procedure: LOWER EXTREMITY ANGIOGRAM;  Surgeon: Conrad El Paraiso, MD;  Location: [facility] CATH LAB;  Service: Cardiovascular;  Laterality: [name];  . Left heart catheterization with coronary/graft angiogram [name] 11/07/2012    Procedure: LEFT HEART CATHETERIZATION WITH Beatrix Fetters;  Surgeon: [name], MD;  Location: [facility] CATH LAB;  Service: Cardiovascular;  Laterality: [name];  . Cardioversion [name] 03/18/2014    Procedure: CARDIOVERSION;  Surgeon: Evans Lance, MD;  Location: [facility] CATH LAB;  Service: Cardiovascular;  Laterality: [name];    Family History  Problem Relation Age  of Onset  . Hypertension Mother   . Heart attack Brother   . Diabetes Son    Social History:  reports that he quit smoking about 68 years ago. His smoking use included Cigarettes. He smoked 0.30 packs per day. He has never used smokeless tobacco. He reports that he does not drink alcohol or use illicit drugs.  Allergies: No Known Allergies   (Not in a hospital admission)  Results for orders placed or performed during the hospital encounter of 07/24/14 (from the past 48 hour(s))  Urinalysis, Routine w reflex microscopic     Status: None   Collection Time: 07/24/14  2:35 PM  Result Value Ref Range   Color, Urine YELLOW YELLOW   APPearance CLEAR CLEAR   Specific Gravity, Urine 1.014 1.005 - 1.030   pH 5.0 5.0 - 8.0   Glucose, UA NEGATIVE NEGATIVE mg/dL   Hgb urine dipstick NEGATIVE NEGATIVE   Bilirubin Urine NEGATIVE NEGATIVE   Ketones, ur NEGATIVE  NEGATIVE mg/dL   Protein, ur NEGATIVE NEGATIVE mg/dL   Urobilinogen, UA 0.2 0.0 - 1.0 mg/dL   Nitrite NEGATIVE NEGATIVE   Leukocytes, UA NEGATIVE NEGATIVE    Comment: MICROSCOPIC NOT DONE ON URINES WITH NEGATIVE PROTEIN, BLOOD, LEUKOCYTES, NITRITE, OR GLUCOSE <1000 mg/dL.  CBC with Differential     Status: Abnormal   Collection Time: 07/24/14  3:52 PM  Result Value Ref Range   WBC 18.4 (H) 4.0 - 10.5 K/uL   RBC 3.92 (L) 4.22 - 5.81 MIL/uL   Hemoglobin 12.3 (L) 13.0 - 17.0 g/dL   HCT 36.4 (L) 39.0 - 52.0 %   MCV 92.9 78.0 - 100.0 fL   MCH 31.4 26.0 - 34.0 pg   MCHC 33.8 30.0 - 36.0 g/dL   RDW 14.0 11.5 - 15.5 %   Platelets 408 (H) 150 - 400 K/uL   Neutrophils Relative % 92 (H) 43 - 77 %   Neutro Abs 17.0 (H) 1.7 - 7.7 K/uL   Lymphocytes Relative 4 (L) 12 - 46 %   Lymphs Abs 0.7 0.7 - 4.0 K/uL   Monocytes Relative 4 3 - 12 %   Monocytes Absolute 0.8 0.1 - 1.0 K/uL   Eosinophils Relative 0 0 - 5 %   Eosinophils Absolute 0.0 0.0 - 0.7 K/uL   Basophils Relative 0 0 - 1 %   Basophils Absolute 0.0 0.0 - 0.1 K/uL  Comprehensive metabolic panel     Status: Abnormal   Collection Time: 07/24/14  3:52 PM  Result Value Ref Range   Sodium 138 135 - 145 mmol/L   Potassium 4.3 3.5 - 5.1 mmol/L   Chloride 98 96 - 112 mmol/L   CO2 31 19 - 32 mmol/L   Glucose, Bld 192 (H) 70 - 99 mg/dL   BUN 33 (H) 6 - 23 mg/dL   Creatinine, Ser 1.25 0.50 - 1.35 mg/dL   Calcium 9.0 8.4 - 10.5 mg/dL   Total Protein 6.8 6.0 - 8.3 g/dL   Albumin 3.2 (L) 3.5 - 5.2 g/dL   AST 15 0 - 37 U/L   ALT 19 0 - 53 U/L   Alkaline Phosphatase 95 39 - 117 U/L   Total Bilirubin 0.5 0.3 - 1.2 mg/dL   GFR calc non Af Amer 49 (L) >90 mL/min   GFR calc Af Amer 57 (L) >90 mL/min    Comment: (NOTE) The eGFR has been calculated using the CKD EPI equation. This calculation has not been validated in all clinical situations.  eGFR's persistently <90 mL/min signify possible Chronic Kidney Disease.    Anion gap 9 5 - 15  I-Stat  CG4 Lactic Acid, ED     Status: None   Collection Time: 07/24/14  6:38 PM  Result Value Ref Range   Lactic Acid, Venous 1.41 0.5 - 2.0 mmol/L   Dg Foot Complete Right  07/24/2014   CLINICAL DATA:  Right foot pain, plantar surface ulcer beneath the great toe  EXAM: RIGHT FOOT COMPLETE - 3+ VIEW  COMPARISON:  07/16/2014  FINDINGS: There has been interval development of soft tissue gas and swelling in the plantar soft tissues at the level of the first metatarsal-phalangeal joint. Vascular calcifications are noted. No osseous destruction, sclerosis, fracture, or dislocation. No new radiopaque foreign body.  IMPRESSION: New soft tissue gas and swelling within the plantar surface of the foot subjacent to the first metatarsal-phalangeal joint. This predisposes the patient to risk for osteomyelitis.   Electronically Signed   By: Conchita Paris M.D.   On: 07/24/2014 19:31    Review of Systems  Constitutional: Negative for fever, chills, weight loss, malaise/fatigue and diaphoresis.  HENT: Negative for congestion, ear discharge, ear pain, hearing loss, nosebleeds, sore throat and tinnitus.   Eyes: Negative for blurred vision, double vision, photophobia, pain, discharge and redness.  Respiratory: Negative for cough, hemoptysis, sputum production, shortness of breath, wheezing and stridor.   Cardiovascular: Negative for chest pain, palpitations, orthopnea, claudication, leg swelling and PND.  Gastrointestinal: Negative for heartburn, nausea, vomiting, abdominal pain, diarrhea, constipation and blood in stool.  Genitourinary: Negative for dysuria, urgency, frequency and hematuria.  Musculoskeletal: Negative for myalgias, back pain, falls and neck pain.  Skin: Negative for itching and rash.  Neurological: Negative for dizziness, tingling, tremors, sensory change, speech change, focal weakness, seizures, loss of consciousness, weakness and headaches.  Endo/Heme/Allergies: Negative for environmental allergies  and polydipsia. Does not bruise/bleed easily.  Psychiatric/Behavioral: Negative for depression, suicidal ideas, hallucinations, memory loss and substance abuse. The patient is not nervous/anxious and does not have insomnia.     Blood pressure 136/44, pulse 62, temperature 97.8 F (36.6 C), temperature source Oral, resp. rate 15, weight 65.772 kg (145 lb), SpO2 100 %. Physical Exam  Constitutional: He is oriented to person, place, and time. He appears well-developed and well-nourished.  HENT:  Head: Normocephalic and atraumatic.  Eyes: Conjunctivae and EOM are normal. Pupils are equal, round, and reactive to light. No scleral icterus.  Neck: Normal range of motion. Neck supple. No JVD present. No tracheal deviation present. No thyromegaly present.  Cardiovascular: Exam reveals no gallop and no friction rub.   Murmur heard. irr , irr s1, s2  Respiratory: Effort normal and breath sounds normal. No respiratory distress. He has no wheezes. He has no rales.  GI: Soft. Bowel sounds are normal. He exhibits no distension. There is no tenderness. There is no rebound and no guarding.  Musculoskeletal: Normal range of motion. He exhibits no edema or tenderness.  Lymphadenopathy:    He has no cervical adenopathy.  Neurological: He is alert and oriented to person, place, and time. He has normal reflexes. He displays normal reflexes. No cranial nerve deficit. He exhibits normal muscle tone. Coordination normal.  Skin: Skin is warm and dry. No rash noted. There is erythema. No pallor.  4x5cm oval area under the 1st mtp dried blood, and redness extending up to the ankle.      Assessment/Plan Cellulitis r/o osteomyelitis Check esr, blood culture x2,  MRI foot tx  with vanco iv, and zosyn iv pharmacy to dose   Leukcytosis Secondary to cellulitis Check cbc in am   Dm2:  fsbs ac and qhs, iss  CAD s/p CABG Cont present medications  Pafib Cont amiodarone Cont carvedilol Cont eliquis  Jani Gravel 07/24/2014, 8:36 PM

## 2014-07-24 NOTE — ED Notes (Signed)
Ordered diabetic dinner tray

## 2014-07-24 NOTE — ED Provider Notes (Signed)
CSN: [id number]     Arrival date & time 07/24/14  1521 History   First MD Initiated Contact with Patient 07/24/14 1742     Chief Complaint  Patient presents with  . Foot Pain     (Consider location/radiation/quality/duration/timing/severity/associated sxs/prior Treatment) HPI Comments: Patient presents to the ER for evaluation of pain and swelling of the right foot. Symptoms began earlier this week. He was seen by his doctor 2 days ago and started on antibiotics. When the pain and swelling worsened today, he was told to come to the ER for admission and IV antibiotics.  Patient reports that he has had a small wound on the bottom of his foot for some time. 3 days ago, however, the area started to become inflamed. Then broke open and has been draining fluid and pus.  Patient is a 81 y.o. male presenting with lower extremity pain.  Foot Pain    Past Medical History  Diagnosis Date  . Coronary artery disease     s/p CABG 1996 then PCI of SVG to OM 2007  . Diverticulosis   . Dyslipidemia   . Renal artery stenosis     s/p bilateral stents  . Hiatal hernia   . Colon polyps   . Carotid artery stenosis     50% BILATERAL 2014  . Peripheral neuropathy   . Anemia   . Hypothyroidism   . Nasal polyps   . Hypertension   . Diabetes mellitus   . Throat cancer S/P TRACHECTOMY   . Myocardial infarction   . Peripheral vascular disease     LOWER EXT  . Atrial fibrillation/flutter Aug 2015  . GERD (gastroesophageal reflux disease)   . Shortness of breath     Hx: of  . HOH (hard of hearing) VERY   . Left bundle branch block (LBBB) on electrocardiogram 03/17/2014  . Ischemic cardiomyopathy Aug 2015    EF 25-30%   . History of throat cancer     s/p trach  . Aortic stenosis Aug 2015    mild to moderate  . Chronic anticoagulation Aug 2015    Eliquis  . CHF (congestive heart failure)    Past Surgical History  Procedure Laterality Date  . Coronary artery bypass graft      1996  . Cardiac  catheterization      PCI of SVG to OM 2007  . Renal artery stent    . Tracheostomy    . Appendectomy    . Tonsillectomy    . Sp pta peripheral  March 2013    Lt SFA (Dr Saundra Shelling)  . Sp pta peripheral  May 2013    Rt SFA (Dr Bridgett Larsson)  . Lower extremity angiogram [name] 08/23/2011    Procedure: LOWER EXTREMITY ANGIOGRAM;  Surgeon: [name], MD;  Location: [facility] CATH LAB;  Service: Cardiovascular;  Laterality: [name];  . Percutaneous stent intervention Left 08/23/2011    Procedure: PERCUTANEOUS STENT INTERVENTION;  Surgeon: [name], MD;  Location: [facility] CATH LAB;  Service: Cardiovascular;  Laterality: Left;  . Lower extremity angiogram [name] 10/25/2011    Procedure: LOWER EXTREMITY ANGIOGRAM;  Surgeon: Conrad Aplington, MD;  Location: [facility] CATH LAB;  Service: Cardiovascular;  Laterality: [name];  . Left heart catheterization with coronary/graft angiogram [name] 11/07/2012    Procedure: LEFT HEART CATHETERIZATION WITH Beatrix Fetters;  Surgeon: [name], MD;  Location: [facility] CATH LAB;  Service: Cardiovascular;  Laterality: [name];  . Cardioversion [name] 03/18/2014    Procedure: CARDIOVERSION;  Surgeon: Evans Lance, MD;  Location: [facility] CATH LAB;  Service: Cardiovascular;  Laterality: [name];   Family History  Problem Relation Age of Onset  . Hypertension Mother   . Heart attack Brother   . Diabetes Son    History  Substance Use Topics  . Smoking status: Former Smoker -- 0.30 packs/day    Types: Cigarettes    Quit date: 06/18/1946  . Smokeless tobacco: Never Used  . Alcohol Use: No    Review of Systems  Skin: Positive for wound.  All other systems reviewed and are negative.     Allergies  Review of patient's allergies indicates no known allergies.  Home Medications   Prior to Admission medications   Medication Sig Start Date End Date Taking? Authorizing Provider  acarbose (PRECOSE) 100 MG tablet Take 100 mg by mouth 3 (three) times daily. 02/20/14   Historical Provider, MD    amiodarone (PACERONE) 400 MG tablet Take 200 mg by mouth daily.    Historical Provider, MD  apixaban (ELIQUIS) 2.5 MG TABS tablet Take 2.5 mg by mouth 2 (two) times daily.    Historical Provider, MD  aspirin EC 81 MG tablet Take 81 mg by mouth 2 (two) times a week. Mondays and thursdays    Historical Provider, MD  atorvastatin (LIPITOR) 10 MG tablet Take 10 mg by mouth daily.    Historical Provider, MD  carvedilol (COREG) 6.25 MG tablet Take 6.25 mg by mouth 2 (two) times daily with a meal.    Historical Provider, MD  ferrous sulfate 325 (65 FE) MG tablet Take 325 mg by mouth daily with breakfast.    Historical Provider, MD  finasteride (PROSCAR) 5 MG tablet Take 5 mg by mouth daily.     Historical Provider, MD  fluticasone (FLONASE) 50 MCG/ACT nasal spray Place 2 sprays into both nostrils daily. 06/13/14   Historical Provider, MD  furosemide (LASIX) 20 MG tablet Take 20 mg by mouth daily.    Historical Provider, MD  glipiZIDE (GLUCOTROL) 10 MG tablet Take 20 mg by mouth 2 (two) times daily before a meal.     Historical Provider, MD  levothyroxine (SYNTHROID, LEVOTHROID) 100 MCG tablet Take 100 mcg by mouth daily before breakfast.    Historical Provider, MD  lisinopril (PRINIVIL,ZESTRIL) 5 MG tablet Take 5 mg by mouth daily.    Historical Provider, MD  metFORMIN (GLUCOPHAGE) 500 MG tablet Take 500 mg by mouth daily. 03/12/14   Historical Provider, MD  methylPREDNISolone (MEDROL DOSEPAK) 4 MG tablet follow package directions 07/17/14   Thurnell Lose, MD  potassium chloride SA (K-DUR,KLOR-CON) 20 MEQ tablet Take 20 mEq by mouth 2 (two) times daily.    Historical Provider, MD  senna-docusate (SENOKOT-S) 8.6-50 MG per tablet Take 1 tablet by mouth at bedtime as needed for mild constipation. 03/29/14   Nishant Dhungel, MD  traMADol (ULTRAM) 50 MG tablet Take 1 tablet (50 mg total) by mouth every 6 (six) hours as needed. Patient not taking: Reported on 07/15/2014 07/13/14   Julianne Rice, MD   BP  148/52 mmHg  Pulse 64  Temp(Src) 97.8 F (36.6 C) (Oral)  Resp 19  Wt 145 lb (65.772 kg)  SpO2 100% Physical Exam  Constitutional: He is oriented to person, place, and time. He appears well-developed and well-nourished. No distress.  HENT:  Head: Normocephalic and atraumatic.  Right Ear: Hearing normal.  Left Ear: Hearing normal.  Nose: Nose normal.  Mouth/Throat: Oropharynx is clear and moist and mucous membranes are  normal.  Eyes: Conjunctivae and EOM are normal. Pupils are equal, round, and reactive to light.  Neck: Normal range of motion. Neck supple.  Cardiovascular: Regular rhythm, S1 normal and S2 normal.  Exam reveals no gallop and no friction rub.   No murmur heard. Pulmonary/Chest: Effort normal and breath sounds normal. No respiratory distress. He exhibits no tenderness.  Abdominal: Soft. Normal appearance and bowel sounds are normal. There is no hepatosplenomegaly. There is no tenderness. There is no rebound, no guarding, no tenderness at McBurney's point and negative Murphy's sign. No hernia.  Musculoskeletal: Normal range of motion.  Neurological: He is alert and oriented to person, place, and time. He has normal strength. No cranial nerve deficit or sensory deficit. Coordination normal. GCS eye subscore is 4. GCS verbal subscore is 5. GCS motor subscore is 6.  Skin: Skin is warm, dry and intact. No rash noted. There is erythema. No cyanosis.     Psychiatric: He has a normal mood and affect. His speech is normal and behavior is normal. Thought content normal.  Nursing note and vitals reviewed.        ED Course  Procedures (including critical care time) Labs Review Labs Reviewed  CBC WITH DIFFERENTIAL/PLATELET - Abnormal; Notable for the following:    WBC 18.4 (*)    RBC 3.92 (*)    Hemoglobin 12.3 (*)    HCT 36.4 (*)    Platelets 408 (*)    Neutrophils Relative % 92 (*)    Neutro Abs 17.0 (*)    Lymphocytes Relative 4 (*)    All other components within  normal limits  COMPREHENSIVE METABOLIC PANEL - Abnormal; Notable for the following:    Glucose, Bld 192 (*)    BUN 33 (*)    Albumin 3.2 (*)    GFR calc non Af Amer 49 (*)    GFR calc Af Amer 57 (*)    All other components within normal limits  CULTURE, BLOOD (ROUTINE X 2)  CULTURE, BLOOD (ROUTINE X 2)  WOUND CULTURE  URINALYSIS, ROUTINE W REFLEX MICROSCOPIC  I-STAT CG4 LACTIC ACID, ED    Imaging Review Dg Foot Complete Right  07/24/2014   CLINICAL DATA:  Right foot pain, plantar surface ulcer beneath the great toe  EXAM: RIGHT FOOT COMPLETE - 3+ VIEW  COMPARISON:  07/16/2014  FINDINGS: There has been interval development of soft tissue gas and swelling in the plantar soft tissues at the level of the first metatarsal-phalangeal joint. Vascular calcifications are noted. No osseous destruction, sclerosis, fracture, or dislocation. No new radiopaque foreign body.  IMPRESSION: New soft tissue gas and swelling within the plantar surface of the foot subjacent to the first metatarsal-phalangeal joint. This predisposes the patient to risk for osteomyelitis.   Electronically Signed   By: Conchita Paris M.D.   On: 07/24/2014 19:31     EKG Interpretation None      MDM   Final diagnoses:  Diabetic ulcer of right foot associated with other specified diabetes mellitus  Cellulitis of right lower extremity   Patient presents to the ER for evaluation of infection of his right foot. Patient had a small wound that has been chronic in the area of the first MTP joint of the right foot. Over the last 3 days, however patient has had redness, swelling and drainage from the area. Examination reveals large eschar over the area with some drainage. There is associated swelling and erythema of the foot all the way up to the ankle  region. This is not concerning for DVT. Patient is a diabetic. He has been on outpatient antibiotics without improvement. He will require hospitalization for IV antibiotic therapy.  Patient initiated on Zosyn and vancomycin.    Orpah Greek, MD 07/24/14 2032

## 2014-07-24 NOTE — ED Notes (Signed)
The pt is having redness swelling pain and drainage form his rt foot since Wednesday.  He saw his docotr  Thursday and today.  He was sent here today for iv antibiotics.  The pt has a trach from throat cancer 30 years ago.  Very difficult to understand his speech

## 2014-07-25 ENCOUNTER — Inpatient Hospital Stay (HOSPITAL_COMMUNITY): Payer: Medicare Other

## 2014-07-25 DIAGNOSIS — L97519 Non-pressure chronic ulcer of other part of right foot with unspecified severity: Secondary | ICD-10-CM

## 2014-07-25 DIAGNOSIS — E13621 Other specified diabetes mellitus with foot ulcer: Secondary | ICD-10-CM

## 2014-07-25 LAB — COMPREHENSIVE METABOLIC PANEL
ALBUMIN: 2.6 g/dL — AB (ref 3.5–5.2)
ALT: 16 U/L (ref 0–53)
ANION GAP: 7 (ref 5–15)
AST: 13 U/L (ref 0–37)
Alkaline Phosphatase: 78 U/L (ref 39–117)
BUN: 35 mg/dL — AB (ref 6–23)
CO2: 31 mmol/L (ref 19–32)
CREATININE: 1.25 mg/dL (ref 0.50–1.35)
Calcium: 8.2 mg/dL — ABNORMAL LOW (ref 8.4–10.5)
Chloride: 99 mmol/L (ref 96–112)
GFR calc Af Amer: 57 mL/min — ABNORMAL LOW (ref >90)
GFR, EST NON AFRICAN AMERICAN: 49 mL/min — AB (ref >90)
GLUCOSE: 172 mg/dL — AB (ref 70–99)
POTASSIUM: 4.1 mmol/L (ref 3.5–5.1)
SODIUM: 137 mmol/L (ref 135–145)
TOTAL PROTEIN: 5.7 g/dL — AB (ref 6.0–8.3)
Total Bilirubin: 0.3 mg/dL (ref 0.3–1.2)

## 2014-07-25 LAB — CBC WITH DIFFERENTIAL/PLATELET
Basophils Absolute: 0 10*3/uL (ref 0.0–0.1)
Basophils Relative: 0 % (ref 0–1)
Eosinophils Absolute: 0 10*3/uL (ref 0.0–0.7)
Eosinophils Relative: 0 % (ref 0–5)
HEMATOCRIT: 31.7 % — AB (ref 39.0–52.0)
HEMOGLOBIN: 10.5 g/dL — AB (ref 13.0–17.0)
Lymphocytes Relative: 5 % — ABNORMAL LOW (ref 12–46)
Lymphs Abs: 0.9 10*3/uL (ref 0.7–4.0)
MCH: 30.9 pg (ref 26.0–34.0)
MCHC: 33.1 g/dL (ref 30.0–36.0)
MCV: 93.2 fL (ref 78.0–100.0)
Monocytes Absolute: 1.1 10*3/uL — ABNORMAL HIGH (ref 0.1–1.0)
Monocytes Relative: 6 % (ref 3–12)
Neutro Abs: 14.6 10*3/uL — ABNORMAL HIGH (ref 1.7–7.7)
Neutrophils Relative %: 89 % — ABNORMAL HIGH (ref 43–77)
PLATELETS: 362 10*3/uL (ref 150–400)
RBC: 3.4 MIL/uL — AB (ref 4.22–5.81)
RDW: 14.1 % (ref 11.5–15.5)
WBC: 16.5 10*3/uL — ABNORMAL HIGH (ref 4.0–10.5)

## 2014-07-25 LAB — GLUCOSE, CAPILLARY
GLUCOSE-CAPILLARY: 143 mg/dL — AB (ref 70–99)
GLUCOSE-CAPILLARY: 198 mg/dL — AB (ref 70–99)
Glucose-Capillary: 223 mg/dL — ABNORMAL HIGH (ref 70–99)
Glucose-Capillary: 183 mg/dL — ABNORMAL HIGH (ref 70–99)

## 2014-07-25 LAB — SEDIMENTATION RATE: SED RATE: 43 mm/h — AB (ref 0–16)

## 2014-07-25 LAB — VITAMIN B12: Vitamin B-12: 276 pg/mL (ref 211–911)

## 2014-07-25 LAB — TSH: TSH: 3.181 u[IU]/mL (ref 0.350–4.500)

## 2014-07-25 NOTE — Progress Notes (Signed)
Patient is looking for his " blue bridges".  None found in the room.  Called ED his pants are not there.  ED nurse thought that patient's son may have taken them home with him.

## 2014-07-25 NOTE — Progress Notes (Signed)
[facility] PROGRESS NOTE  [name] SEG:[id number] DOB: 06/09/1923 DOA: 07-27-2014 PCP: Mathews Argyle, MD  Assessment/Plan: 1-Right foot cellulitis with gangrenous ulcer of the plantar aspect of first metatarsal head:  -Ortho consulted. Dr Sharol Given recommend cardiology consultation for arteriogram. If patient is not candidate for revascularization patient might require a transtibial amputation. Patient will also need to be clear for surgery by cardiology.  -Cardio will evaluate patient on 2-8. -Continue with IV antibiotics.  -ABI, MRI ordered.  -WBC trending down.   2-A fib; continue with amiodarone, carvedilol, eliquis.   3-Diabetes: hold glipizide. Continue with SSI.   4-HLD; continue with Lipitor.   5-Hypothyroidism; continue with synthroid.   6-Systolic HF, Ef 25 to 30 %; by ECHO 2015. Appears compensated.  Continue with lasix, lisinopril.   7-Throat Cancer, S/P trach; respiratory therapy to follow.   Code Status: Full Code.  Family Communication: Care discussed with Daughter in [place].  Disposition Plan: might need SNF   Consultants:  Ortho, Dr Sharol Given.   Procedures:  ABI; ordered.   Antibiotics:  Vancomycin 2-6  Zosyn 2-6  HPI/Subjective: He denies dyspnea. He doesn't recall since he has this foot infection.  he wants to eat. No significant pain LE.   Objective: Filed Vitals:   07/25/14 0605  BP: 157/58  Pulse: 62  Temp: 97.7 F (36.5 C)  Resp: 16    Intake/Output Summary (Last 24 hours) at 07/25/14 0915 Last data filed at 07/25/14 0606  Gross per 24 hour  Intake      0 ml  Output    100 ml  Net   -100 ml   Filed Weights   07/27/2014 1527  Weight: 65.772 kg (145 lb)    Exam:   General:  Alert, cachetic, in no distress. trach  Cardiovascular: S 1, S 2 RRR  Respiratory: CTA  Abdomen: BS, present, nt  Musculoskeletal: no edema.   Extremity: right gangrenous ulcer plantar aspect of first metatarsal.   Data Reviewed: Basic  Metabolic Panel:  Recent Labs Lab July 27, 2014 1552 07/25/14 0440  NA 138 137  K 4.3 4.1  CL 98 99  CO2 31 31  GLUCOSE 192* 172*  BUN 33* 35*  CREATININE 1.25 1.25  CALCIUM 9.0 8.2*   Liver Function Tests:  Recent Labs Lab 07-27-14 1552 07/25/14 0440  AST 15 13  ALT 19 16  ALKPHOS 95 78  BILITOT 0.5 0.3  PROT 6.8 5.7*  ALBUMIN 3.2* 2.6*   No results for input(s): LIPASE, AMYLASE in the last 168 hours. No results for input(s): AMMONIA in the last 168 hours. CBC:  Recent Labs Lab 07/27/14 1552 07/25/14 0440  WBC 18.4* 16.5*  NEUTROABS 17.0* 14.6*  HGB 12.3* 10.5*  HCT 36.4* 31.7*  MCV 92.9 93.2  PLT 408* 362   Cardiac Enzymes: No results for input(s): CKTOTAL, CKMB, CKMBINDEX, TROPONINI in the last 168 hours. BNP (last 3 results)  Recent Labs  07/12/14 2121  BNP 315.7*    ProBNP (last 3 results)  Recent Labs  02/12/14 0728 03/17/14 1030 03/18/14 0320  PROBNP 9440.0* 5030.0* 7135.0*    CBG:  Recent Labs Lab July 27, 2014 2234  GLUCAP 286*    No results found for this or any previous visit (from the past 240 hour(s)).   Studies: Dg Foot Complete Right  07-27-2014   CLINICAL DATA:  Right foot pain, plantar surface ulcer beneath the great toe  EXAM: RIGHT FOOT COMPLETE - 3+ VIEW  COMPARISON:  07/16/2014  FINDINGS: There has been interval  development of soft tissue gas and swelling in the plantar soft tissues at the level of the first metatarsal-phalangeal joint. Vascular calcifications are noted. No osseous destruction, sclerosis, fracture, or dislocation. No new radiopaque foreign body.  IMPRESSION: New soft tissue gas and swelling within the plantar surface of the foot subjacent to the first metatarsal-phalangeal joint. This predisposes the patient to risk for osteomyelitis.   Electronically Signed   By: Conchita Paris M.D.   On: 07/24/2014 19:31    Scheduled Meds: . acarbose  100 mg Oral TID WC  . amiodarone  200 mg Oral Daily  . apixaban  2.5 mg  Oral BID  . [START ON 07/26/2014] aspirin EC  81 mg Oral Once per day on Mon Thu  . atorvastatin  10 mg Oral Daily  . carvedilol  6.25 mg Oral BID WC  . ferrous sulfate  325 mg Oral Q breakfast  . finasteride  5 mg Oral Daily  . fluticasone  2 spray Each Nare Daily  . furosemide  20 mg Oral Daily  . glipiZIDE  20 mg Oral BID AC  . insulin aspart  0-5 Units Subcutaneous QHS  . insulin aspart  0-9 Units Subcutaneous TID WC  . levothyroxine  100 mcg Oral QAC breakfast  . lisinopril  5 mg Oral Daily  . metFORMIN  500 mg Oral Q breakfast  . piperacillin-tazobactam (ZOSYN)  IV  3.375 g Intravenous 3 times per day  . potassium chloride SA  20 mEq Oral BID  . sodium chloride  3 mL Intravenous Q12H  . sodium chloride  3 mL Intravenous Q12H  . vancomycin  750 mg Intravenous Q24H   Continuous Infusions:   Active Problems:   DM (diabetes mellitus)   Cellulitis   Anemia   Diabetic ulcer of right foot    Time spent: 35 minutes.     Niel Hummer A  [facility] Pager [phone]. If 7PM-7AM, please contact night-coverage at [email or web address], password [facility] 07/25/2014, 9:15 AM  LOS: 1 day

## 2014-07-25 NOTE — Progress Notes (Signed)
Utilization review completed.  

## 2014-07-25 NOTE — Progress Notes (Signed)
Pt attempted MRI - refused exam due to noise. Pt was given earplugs and was offered ear muffs to place on top of having the ear plugs. Pt refused the offer saying he didn't want to do that to his ears. Called and relayed this information to the RN as well prior to pt returning back up stairs.

## 2014-07-25 NOTE — Consult Note (Signed)
Reason for Consult: Gangrenous ulcer right foot first metatarsal head Referring Physician: Dr Bonnita Hollow [name] is an 81 y.o. male.  HPI: Patient is an 81 year old gentleman with her full vascular disease diabetes hypertension coronary artery disease who presents with gangrenous ulcer of the first metatarsal head right foot. There is surrounding cellulitis.  Past Medical History  Diagnosis Date  . Coronary artery disease     s/p CABG 1996 then PCI of SVG to OM 2007  . Diverticulosis   . Dyslipidemia   . Renal artery stenosis     s/p bilateral stents  . Hiatal hernia   . Colon polyps   . Carotid artery stenosis     50% BILATERAL 2014  . Peripheral neuropathy   . Anemia   . Hypothyroidism   . Nasal polyps   . Hypertension   . Diabetes mellitus   . Throat cancer S/P TRACHECTOMY   . Myocardial infarction   . Peripheral vascular disease     LOWER EXT  . Atrial fibrillation/flutter Aug 2015  . GERD (gastroesophageal reflux disease)   . Shortness of breath     Hx: of  . HOH (hard of hearing) VERY   . Left bundle branch block (LBBB) on electrocardiogram 03/17/2014  . Ischemic cardiomyopathy Aug 2015    EF 25-30%   . History of throat cancer     s/p trach  . Aortic stenosis Aug 2015    mild to moderate  . Chronic anticoagulation Aug 2015    Eliquis  . CHF (congestive heart failure)     Past Surgical History  Procedure Laterality Date  . Coronary artery bypass graft      1996  . Cardiac catheterization      PCI of SVG to OM 2007  . Renal artery stent    . Tracheostomy    . Appendectomy    . Tonsillectomy    . Sp pta peripheral  March 2013    Lt SFA (Dr Saundra Shelling)  . Sp pta peripheral  May 2013    Rt SFA (Dr Bridgett Larsson)  . Lower extremity angiogram [name] 08/23/2011    Procedure: LOWER EXTREMITY ANGIOGRAM;  Surgeon: [name], MD;  Location: [facility] CATH LAB;  Service: Cardiovascular;  Laterality: [name];  . Percutaneous stent intervention Left 08/23/2011    Procedure:  PERCUTANEOUS STENT INTERVENTION;  Surgeon: [name], MD;  Location: [facility] CATH LAB;  Service: Cardiovascular;  Laterality: Left;  . Lower extremity angiogram [name] 10/25/2011    Procedure: LOWER EXTREMITY ANGIOGRAM;  Surgeon: Conrad Douglass, MD;  Location: [facility] CATH LAB;  Service: Cardiovascular;  Laterality: [name];  . Left heart catheterization with coronary/graft angiogram [name] 11/07/2012    Procedure: LEFT HEART CATHETERIZATION WITH Beatrix Fetters;  Surgeon: [name], MD;  Location: [facility] CATH LAB;  Service: Cardiovascular;  Laterality: [name];  . Cardioversion [name] 03/18/2014    Procedure: CARDIOVERSION;  Surgeon: Evans Lance, MD;  Location: [facility] CATH LAB;  Service: Cardiovascular;  Laterality: [name];    Family History  Problem Relation Age of Onset  . Hypertension Mother   . Heart attack Brother   . Diabetes Son     Social History:  reports that he quit smoking about 68 years ago. His smoking use included Cigarettes. He smoked 0.30 packs per day. He has never used smokeless tobacco. He reports that he does not drink alcohol or use illicit drugs.  Allergies: No Known Allergies  Medications: I have reviewed the patient's current medications.  Results for orders placed or performed during the hospital encounter of 07/24/14 (from the past 48 hour(s))  Urinalysis, Routine w reflex microscopic     Status: None   Collection Time: 07/24/14  2:35 PM  Result Value Ref Range   Color, Urine YELLOW YELLOW   APPearance CLEAR CLEAR   Specific Gravity, Urine 1.014 1.005 - 1.030   pH 5.0 5.0 - 8.0   Glucose, UA NEGATIVE NEGATIVE mg/dL   Hgb urine dipstick NEGATIVE NEGATIVE   Bilirubin Urine NEGATIVE NEGATIVE   Ketones, ur NEGATIVE NEGATIVE mg/dL   Protein, ur NEGATIVE NEGATIVE mg/dL   Urobilinogen, UA 0.2 0.0 - 1.0 mg/dL   Nitrite NEGATIVE NEGATIVE   Leukocytes, UA NEGATIVE NEGATIVE    Comment: MICROSCOPIC NOT DONE ON URINES WITH NEGATIVE PROTEIN, BLOOD, LEUKOCYTES, NITRITE, OR  GLUCOSE <1000 mg/dL.  CBC with Differential     Status: Abnormal   Collection Time: 07/24/14  3:52 PM  Result Value Ref Range   WBC 18.4 (H) 4.0 - 10.5 K/uL   RBC 3.92 (L) 4.22 - 5.81 MIL/uL   Hemoglobin 12.3 (L) 13.0 - 17.0 g/dL   HCT 36.4 (L) 39.0 - 52.0 %   MCV 92.9 78.0 - 100.0 fL   MCH 31.4 26.0 - 34.0 pg   MCHC 33.8 30.0 - 36.0 g/dL   RDW 14.0 11.5 - 15.5 %   Platelets 408 (H) 150 - 400 K/uL   Neutrophils Relative % 92 (H) 43 - 77 %   Neutro Abs 17.0 (H) 1.7 - 7.7 K/uL   Lymphocytes Relative 4 (L) 12 - 46 %   Lymphs Abs 0.7 0.7 - 4.0 K/uL   Monocytes Relative 4 3 - 12 %   Monocytes Absolute 0.8 0.1 - 1.0 K/uL   Eosinophils Relative 0 0 - 5 %   Eosinophils Absolute 0.0 0.0 - 0.7 K/uL   Basophils Relative 0 0 - 1 %   Basophils Absolute 0.0 0.0 - 0.1 K/uL  Comprehensive metabolic panel     Status: Abnormal   Collection Time: 07/24/14  3:52 PM  Result Value Ref Range   Sodium 138 135 - 145 mmol/L   Potassium 4.3 3.5 - 5.1 mmol/L   Chloride 98 96 - 112 mmol/L   CO2 31 19 - 32 mmol/L   Glucose, Bld 192 (H) 70 - 99 mg/dL   BUN 33 (H) 6 - 23 mg/dL   Creatinine, Ser 1.25 0.50 - 1.35 mg/dL   Calcium 9.0 8.4 - 10.5 mg/dL   Total Protein 6.8 6.0 - 8.3 g/dL   Albumin 3.2 (L) 3.5 - 5.2 g/dL   AST 15 0 - 37 U/L   ALT 19 0 - 53 U/L   Alkaline Phosphatase 95 39 - 117 U/L   Total Bilirubin 0.5 0.3 - 1.2 mg/dL   GFR calc non Af Amer 49 (L) >90 mL/min   GFR calc Af Amer 57 (L) >90 mL/min    Comment: (NOTE) The eGFR has been calculated using the CKD EPI equation. This calculation has not been validated in all clinical situations. eGFR's persistently <90 mL/min signify possible Chronic Kidney Disease.    Anion gap 9 5 - 15  I-Stat CG4 Lactic Acid, ED     Status: None   Collection Time: 07/24/14  6:38 PM  Result Value Ref Range   Lactic Acid, Venous 1.41 0.5 - 2.0 mmol/L  Glucose, capillary     Status: Abnormal   Collection Time: 07/24/14 10:34 PM  Result Value Ref Range  Glucose-Capillary 286 (H) 70 - 99 mg/dL  Sedimentation rate     Status: Abnormal   Collection Time: 07/24/14 11:00 PM  Result Value Ref Range   Sed Rate 43 (H) 0 - 16 mm/hr  CBC with Differential/Platelet     Status: Abnormal   Collection Time: 07/25/14  4:40 AM  Result Value Ref Range   WBC 16.5 (H) 4.0 - 10.5 K/uL   RBC 3.40 (L) 4.22 - 5.81 MIL/uL   Hemoglobin 10.5 (L) 13.0 - 17.0 g/dL   HCT 31.7 (L) 39.0 - 52.0 %   MCV 93.2 78.0 - 100.0 fL   MCH 30.9 26.0 - 34.0 pg   MCHC 33.1 30.0 - 36.0 g/dL   RDW 14.1 11.5 - 15.5 %   Platelets 362 150 - 400 K/uL   Neutrophils Relative % 89 (H) 43 - 77 %   Neutro Abs 14.6 (H) 1.7 - 7.7 K/uL   Lymphocytes Relative 5 (L) 12 - 46 %   Lymphs Abs 0.9 0.7 - 4.0 K/uL   Monocytes Relative 6 3 - 12 %   Monocytes Absolute 1.1 (H) 0.1 - 1.0 K/uL   Eosinophils Relative 0 0 - 5 %   Eosinophils Absolute 0.0 0.0 - 0.7 K/uL   Basophils Relative 0 0 - 1 %   Basophils Absolute 0.0 0.0 - 0.1 K/uL  Comprehensive metabolic panel     Status: Abnormal   Collection Time: 07/25/14  4:40 AM  Result Value Ref Range   Sodium 137 135 - 145 mmol/L   Potassium 4.1 3.5 - 5.1 mmol/L   Chloride 99 96 - 112 mmol/L   CO2 31 19 - 32 mmol/L   Glucose, Bld 172 (H) 70 - 99 mg/dL   BUN 35 (H) 6 - 23 mg/dL   Creatinine, Ser 1.25 0.50 - 1.35 mg/dL   Calcium 8.2 (L) 8.4 - 10.5 mg/dL   Total Protein 5.7 (L) 6.0 - 8.3 g/dL   Albumin 2.6 (L) 3.5 - 5.2 g/dL   AST 13 0 - 37 U/L   ALT 16 0 - 53 U/L   Alkaline Phosphatase 78 39 - 117 U/L   Total Bilirubin 0.3 0.3 - 1.2 mg/dL   GFR calc non Af Amer 49 (L) >90 mL/min   GFR calc Af Amer 57 (L) >90 mL/min    Comment: (NOTE) The eGFR has been calculated using the CKD EPI equation. This calculation has not been validated in all clinical situations. eGFR's persistently <90 mL/min signify possible Chronic Kidney Disease.    Anion gap 7 5 - 15  Glucose, capillary     Status: Abnormal   Collection Time: 07/25/14  7:38 AM  Result  Value Ref Range   Glucose-Capillary 143 (H) 70 - 99 mg/dL   Comment 1 Notify RN   Glucose, capillary     Status: Abnormal   Collection Time: 07/25/14 12:26 PM  Result Value Ref Range   Glucose-Capillary 223 (H) 70 - 99 mg/dL   Comment 1 Notify RN     Dg Foot Complete Right  07/24/2014   CLINICAL DATA:  Right foot pain, plantar surface ulcer beneath the great toe  EXAM: RIGHT FOOT COMPLETE - 3+ VIEW  COMPARISON:  07/16/2014  FINDINGS: There has been interval development of soft tissue gas and swelling in the plantar soft tissues at the level of the first metatarsal-phalangeal joint. Vascular calcifications are noted. No osseous destruction, sclerosis, fracture, or dislocation. No new radiopaque foreign body.  IMPRESSION: New soft tissue  gas and swelling within the plantar surface of the foot subjacent to the first metatarsal-phalangeal joint. This predisposes the patient to risk for osteomyelitis.   Electronically Signed   By: Conchita Paris M.D.   On: 07/24/2014 19:31    Review of Systems  All other systems reviewed and are negative.  Blood pressure 157/58, pulse 62, temperature 97.7 F (36.5 C), temperature source Oral, resp. rate 16, weight 65.772 kg (145 lb), SpO2 99 %. Physical Exam On examination patient does not have a palpable pulse on either lower extremity. There is no ulcerations to the left foot. Examination of right foot he has a black gangrenous ulcer to the plantar aspect of the first metatarsal head this is approximately 4 cm in diameter. Patient does have surrounding cellulitis there are no palpable pulses. Patient has significant peripheral vascular disease with calcification of the blood vessels in his foot. Assessment/Plan: Assessment: Gangrenous ulcer right foot with severe peripheral vascular disease.  Plan: Ankle-brachial indices and MRI scan is ordered.  Patient will require vascular workup. Patient is currently seen by [facility] cardiology. Would recommend consultation  with [facility] cardiology for arteriogram study of the right lower extremity.If the patient is not a revascularization candidate he most likely would require a transtibial amputation. I will follow-up after his vascular workup is complete. No wound care dressing necessary at this time.  Estefani Bateson V 07/25/2014, 1:14 PM

## 2014-07-26 DIAGNOSIS — L03115 Cellulitis of right lower limb: Secondary | ICD-10-CM

## 2014-07-26 DIAGNOSIS — E13621 Other specified diabetes mellitus with foot ulcer: Secondary | ICD-10-CM

## 2014-07-26 DIAGNOSIS — I25810 Atherosclerosis of coronary artery bypass graft(s) without angina pectoris: Secondary | ICD-10-CM

## 2014-07-26 DIAGNOSIS — I429 Cardiomyopathy, unspecified: Secondary | ICD-10-CM

## 2014-07-26 DIAGNOSIS — E08621 Diabetes mellitus due to underlying condition with foot ulcer: Secondary | ICD-10-CM

## 2014-07-26 DIAGNOSIS — Z85818 Personal history of malignant neoplasm of other sites of lip, oral cavity, and pharynx: Secondary | ICD-10-CM

## 2014-07-26 DIAGNOSIS — I480 Paroxysmal atrial fibrillation: Secondary | ICD-10-CM

## 2014-07-26 DIAGNOSIS — L97519 Non-pressure chronic ulcer of other part of right foot with unspecified severity: Secondary | ICD-10-CM

## 2014-07-26 LAB — CBC
HEMATOCRIT: 34.9 % — AB (ref 39.0–52.0)
HEMOGLOBIN: 11.5 g/dL — AB (ref 13.0–17.0)
MCH: 31.1 pg (ref 26.0–34.0)
MCHC: 33 g/dL (ref 30.0–36.0)
MCV: 94.3 fL (ref 78.0–100.0)
PLATELETS: 369 10*3/uL (ref 150–400)
RBC: 3.7 MIL/uL — AB (ref 4.22–5.81)
RDW: 13.8 % (ref 11.5–15.5)
WBC: 12.7 10*3/uL — ABNORMAL HIGH (ref 4.0–10.5)

## 2014-07-26 LAB — BASIC METABOLIC PANEL
ANION GAP: 5 (ref 5–15)
BUN: 26 mg/dL — ABNORMAL HIGH (ref 6–23)
CO2: 32 mmol/L (ref 19–32)
CREATININE: 1.22 mg/dL (ref 0.50–1.35)
Calcium: 8.4 mg/dL (ref 8.4–10.5)
Chloride: 98 mmol/L (ref 96–112)
GFR calc Af Amer: 59 mL/min — ABNORMAL LOW (ref >90)
GFR calc non Af Amer: 51 mL/min — ABNORMAL LOW (ref >90)
Glucose, Bld: 203 mg/dL — ABNORMAL HIGH (ref 70–99)
Potassium: 4.4 mmol/L (ref 3.5–5.1)
Sodium: 135 mmol/L (ref 135–145)

## 2014-07-26 LAB — GLUCOSE, CAPILLARY: Glucose-Capillary: 204 mg/dL — ABNORMAL HIGH (ref 70–99)

## 2014-07-26 LAB — HEMOGLOBIN A1C
Hgb A1c MFr Bld: 7.7 % — ABNORMAL HIGH (ref 4.8–5.6)
MEAN PLASMA GLUCOSE: 174 mg/dL

## 2014-07-26 NOTE — Progress Notes (Signed)
[facility] PROGRESS NOTE  Jesselee Poth MWU:[id number] DOB: 24-Jul-1922 DOA: 07/24/2014 PCP: Mathews Argyle, MD  Assessment/Plan: 1-Right foot cellulitis with gangrenous ulcer of the plantar aspect of first metatarsal head:  -Ortho consulted. Dr Sharol Given recommend cardiology consultation for arteriogram. If patient is not candidate for revascularization patient might require a transtibial amputation.  -Arteriogram by cardio.  -Continue with IV antibiotics. Day 2.  -ABI, MRI ordered.  -WBC trending down. 18---12.   2-A fib; continue with amiodarone, carvedilol. eliquis on hold.   3-Diabetes: hold glipizide. Continue with SSI.   4-HLD; continue with Lipitor.   5-Hypothyroidism; continue with synthroid.   6-Systolic HF, Ef 25 to 30 %; by ECHO 2015. Appears compensated.  Continue with lasix, lisinopril.   7-Throat Cancer, S/P trach; Will try to contact Dr Gwenette Greet, to see if she has privilege here in [place].   Code Status: Full Code.  Family Communication: Care discussed with Daughter in [place].  Disposition Plan: might need SNF   Consultants:  Ortho, Dr Sharol Given.   Procedures:  ABI; ordered.   Antibiotics:  Vancomycin 2-6  Zosyn 2-6  HPI/Subjective: No complaints. He relates that he has an appointment on Thursday for replace the trach.     Objective: Filed Vitals:   07/26/14 0556  BP: 159/61  Pulse: 62  Temp: 97.5 F (36.4 C)  Resp: 16    Intake/Output Summary (Last 24 hours) at 07/26/14 1336 Last data filed at 07/26/14 1059  Gross per 24 hour  Intake      0 ml  Output   1545 ml  Net  -1545 ml   Filed Weights   07/24/14 1527 07/26/14 0556  Weight: 65.772 kg (145 lb) 59.9 kg (132 lb 0.9 oz)    Exam:   General:  Alert, cachetic, in no distress. trach  Cardiovascular: S 1, S 2 RRR  Respiratory: CTA  Abdomen: BS, present, nt  Musculoskeletal: no edema.   Extremity: right gangrenous ulcer plantar aspect of first metatarsal. Redness right  foot has decreased.   Data Reviewed: Basic Metabolic Panel:  Recent Labs Lab 07/24/14 1552 07/25/14 0440 07/26/14 0540  NA 138 137 135  K 4.3 4.1 4.4  CL 98 99 98  CO2 31 31 32  GLUCOSE 192* 172* 203*  BUN 33* 35* 26*  CREATININE 1.25 1.25 1.22  CALCIUM 9.0 8.2* 8.4   Liver Function Tests:  Recent Labs Lab 07/24/14 1552 07/25/14 0440  AST 15 13  ALT 19 16  ALKPHOS 95 78  BILITOT 0.5 0.3  PROT 6.8 5.7*  ALBUMIN 3.2* 2.6*   No results for input(s): LIPASE, AMYLASE in the last 168 hours. No results for input(s): AMMONIA in the last 168 hours. CBC:  Recent Labs Lab 07/24/14 1552 07/25/14 0440 07/26/14 0540  WBC 18.4* 16.5* 12.7*  NEUTROABS 17.0* 14.6*  --   HGB 12.3* 10.5* 11.5*  HCT 36.4* 31.7* 34.9*  MCV 92.9 93.2 94.3  PLT 408* 362 369   Cardiac Enzymes: No results for input(s): CKTOTAL, CKMB, CKMBINDEX, TROPONINI in the last 168 hours. BNP (last 3 results)  Recent Labs  07/12/14 2121  BNP 315.7*    ProBNP (last 3 results)  Recent Labs  02/12/14 0728 03/17/14 1030 03/18/14 0320  PROBNP 9440.0* 5030.0* 7135.0*    CBG:  Recent Labs Lab 07/25/14 0738 07/25/14 1226 07/25/14 1749 07/25/14 2203 07/26/14 0741  GLUCAP 143* 223* 198* 183* 204*    Recent Results (from the past 240 hour(s))  Culture, blood (routine x 2)  Status: None (Preliminary result)   Collection Time: Aug 23, 2014  6:30 PM  Result Value Ref Range Status   Specimen Description BLOOD RIGHT ARM  Final   Special Requests   Final    BOTTLES DRAWN AEROBIC AND ANAEROBIC 4CC BLUE Tull RED   Culture   Final           BLOOD CULTURE RECEIVED NO GROWTH TO DATE CULTURE WILL BE HELD FOR 5 DAYS BEFORE ISSUING A FINAL NEGATIVE REPORT Note: Culture results may be compromised due to an excessive volume of blood received in culture bottles. Performed at [facility]    Report Status PENDING  Incomplete  Culture, blood (routine x 2)     Status: None (Preliminary result)    Collection Time: 08-23-14  6:34 PM  Result Value Ref Range Status   Specimen Description BLOOD LEFT ARM  Final   Special Requests BOTTLES DRAWN AEROBIC AND ANAEROBIC 10CC  Final   Culture   Final           BLOOD CULTURE RECEIVED NO GROWTH TO DATE CULTURE WILL BE HELD FOR 5 DAYS BEFORE ISSUING A FINAL NEGATIVE REPORT Note: Culture results may be compromised due to an inadequate volume of blood received in culture bottles. Performed at [facility]    Report Status PENDING  Incomplete  Wound culture     Status: None (Preliminary result)   Collection Time: August 23, 2014  6:40 PM  Result Value Ref Range Status   Specimen Description WOUND RIGHT FOOT  Final   Special Requests NONE  Final   Gram Stain PENDING  Incomplete   Culture   Final    ABUNDANT STAPHYLOCOCCUS AUREUS Note: RIFAMPIN AND GENTAMICIN SHOULD NOT BE USED AS SINGLE DRUGS FOR TREATMENT OF STAPH INFECTIONS. Performed at [facility]    Report Status PENDING  Incomplete     Studies: Dg Foot Complete Right  2014/08/23   CLINICAL DATA:  Right foot pain, plantar surface ulcer beneath the great toe  EXAM: RIGHT FOOT COMPLETE - 3+ VIEW  COMPARISON:  07/16/2014  FINDINGS: There has been interval development of soft tissue gas and swelling in the plantar soft tissues at the level of the first metatarsal-phalangeal joint. Vascular calcifications are noted. No osseous destruction, sclerosis, fracture, or dislocation. No new radiopaque foreign body.  IMPRESSION: New soft tissue gas and swelling within the plantar surface of the foot subjacent to the first metatarsal-phalangeal joint. This predisposes the patient to risk for osteomyelitis.   Electronically Signed   By: Conchita Paris M.D.   On: 08-23-14 19:31    Scheduled Meds: . acarbose  100 mg Oral TID WC  . amiodarone  200 mg Oral Daily  . apixaban  2.5 mg Oral BID  . aspirin EC  81 mg Oral Once per day on Mon Thu  . atorvastatin  10 mg Oral Daily  . carvedilol  6.25 mg  Oral BID WC  . ferrous sulfate  325 mg Oral Q breakfast  . finasteride  5 mg Oral Daily  . fluticasone  2 spray Each Nare Daily  . furosemide  20 mg Oral Daily  . insulin aspart  0-5 Units Subcutaneous QHS  . insulin aspart  0-9 Units Subcutaneous TID WC  . levothyroxine  100 mcg Oral QAC breakfast  . lisinopril  5 mg Oral Daily  . piperacillin-tazobactam (ZOSYN)  IV  3.375 g Intravenous 3 times per day  . potassium chloride SA  20 mEq Oral BID  .  sodium chloride  3 mL Intravenous Q12H  . sodium chloride  3 mL Intravenous Q12H  . vancomycin  750 mg Intravenous Q24H   Continuous Infusions:   Active Problems:   Peripheral vascular disease   DM (diabetes mellitus)   Cardiomyopathy- EF 25-30%   History of throat cancer   Cellulitis   Hypomagnesemia   Anemia   Diabetic ulcer of right foot    Time spent: 35 minutes.     Niel Hummer A  [facility] Pager [phone]. If 7PM-7AM, please contact night-coverage at [email or web address], password [facility] 07/26/2014, 1:36 PM  LOS: 2 days

## 2014-07-26 NOTE — Progress Notes (Signed)
VASCULAR LAB PRELIMINARY  ARTERIAL  ABI completed:    RIGHT    LEFT    PRESSURE WAVEFORM  PRESSURE WAVEFORM  BRACHIAL 107 triphasic BRACHIAL 94 triphasic  DP  absent DP 18 Severely dampened monophasic  Peroneal  absent Peroneal  absent  PT 38 Severely dampened monophasic PT 95 Severely dampened monophasic  GREAT TOE  absent GREAT TOE absent NA    RIGHT LEFT  ABI 0.36 0.89   Technically difficult due to absence of doppler waveforms and audible signals.  RT ABI PT indicates severe reduction in arterial flow with absence of peroneal and anterior tibial signals. LT ABI PT indicates a mild reduction in arterial flow, however, doppler waveforms were severely dampened and monophasic indicating possible calcification. LT dorsal pedalis waveform and doppler signal indicates a severe reduction in arterial flow. Bilateral toe pressures could not be obtained due to absence of discernable arterial flow.  Lindwood Coke, RVT 07/26/2014, 3:30 PM

## 2014-07-26 NOTE — Progress Notes (Signed)
PT Cancellation Note  Patient Details Name: [name] MRN: [id number] DOB: [birth year]   Cancelled Treatment:    Reason Eval/Treat Not Completed: Patient at procedure or test/unavailable.  Patient out of room in testing.  Will return at later time for PT evaluation.   Despina Pole 07/26/2014, 2:43 PM Carita Pian. Sanjuana Kava, [place] Pager [phone]

## 2014-07-26 NOTE — Progress Notes (Signed)
RT checked stoma.  No problems, and pt on ra

## 2014-07-26 NOTE — Consult Note (Addendum)
[facility]  Reason for Consult:  Right foot gangrenous ulcer Referring Physician:  Dr. Harrington Challenger MRN #:  [id number]  History of Present Illness: This is a 81 y.o. male who we've been consulted for regarding a right foot wound. The patient is an unreliable historian. He is only oriented to self. He states that his wound has been present on his foot for the past three months and has worsened. He denies any trauma and any pain. He was seen by his PCP last week and was started on oral antibiotics. He presented to the ED on 07/24/14 due to worsening of his wound and was admitted for IV antibiotics. The patient lives at home alone. He states that he does walk around his house regularly with a walker.  He has two sons.   He was last an arteriogram with left leg runoff in 10/2011 by Dr. Bridgett Larsson due to a left foot ulcer. His potential revascularization option at that time was femoral to posterior tibial bypass, but his left ulcer eventually healed. Before that in 08/2011, he had an aortogram with bilateral runoff revealing extensive destruction of the femoropopliteal system and extensive tibial disease bilaterally. Dr. Bridgett Larsson did not believe his right leg had a good revascularization target.   He has a PMH of CAD s/p CABG 1996 and PCI 2007, aortic stenosis, paroxysmal afib (on eliquis),  hyperlipidemia, diabetes mellitus and hypertension, all of which are stable.   Past Medical History  Diagnosis Date  . Coronary artery disease     s/p CABG 1996 then PCI of SVG to OM 2007  . Diverticulosis   . Dyslipidemia   . Renal artery stenosis     s/p bilateral stents  . Hiatal hernia   . Colon polyps   . Carotid artery stenosis     50% BILATERAL 2014  . Peripheral neuropathy   . Anemia   . Hypothyroidism   . Nasal polyps   . Hypertension   . Diabetes mellitus   . Throat cancer S/P TRACHECTOMY   . Myocardial infarction   . Peripheral vascular disease     LOWER EXT  . Atrial  fibrillation/flutter Aug 2015  . GERD (gastroesophageal reflux disease)   . Shortness of breath     Hx: of  . HOH (hard of hearing) VERY   . Left bundle branch block (LBBB) on electrocardiogram 03/17/2014  . Ischemic cardiomyopathy Aug 2015    EF 25-30%   . History of throat cancer     s/p trach  . Aortic stenosis Aug 2015    mild to moderate  . Chronic anticoagulation Aug 2015    Eliquis  . CHF (congestive heart failure)    Past Surgical History  Procedure Laterality Date  . Coronary artery bypass graft      1996  . Cardiac catheterization      PCI of SVG to OM 2007  . Renal artery stent    . Tracheostomy    . Appendectomy    . Tonsillectomy    . Sp pta peripheral  March 2013    Lt SFA (Dr Saundra Shelling)  . Sp pta peripheral  May 2013    Rt SFA (Dr Bridgett Larsson)  . Lower extremity angiogram [name] 08/23/2011    Procedure: LOWER EXTREMITY ANGIOGRAM;  Surgeon: [name], MD;  Location: [facility] CATH LAB;  Service: Cardiovascular;  Laterality: [name];  . Percutaneous stent intervention Left 08/23/2011    Procedure: PERCUTANEOUS STENT INTERVENTION;  Surgeon: Charlann Lange  Irish Lack, MD;  Location: [facility] CATH LAB;  Service: Cardiovascular;  Laterality: Left;  . Lower extremity angiogram [name] 10/25/2011    Procedure: LOWER EXTREMITY ANGIOGRAM;  Surgeon: Conrad Lula, MD;  Location: [facility] CATH LAB;  Service: Cardiovascular;  Laterality: [name];  . Left heart catheterization with coronary/graft angiogram [name] 11/07/2012    Procedure: LEFT HEART CATHETERIZATION WITH Beatrix Fetters;  Surgeon: [name], MD;  Location: [facility] CATH LAB;  Service: Cardiovascular;  Laterality: [name];  . Cardioversion [name] 03/18/2014    Procedure: CARDIOVERSION;  Surgeon: Evans Lance, MD;  Location: [facility] CATH LAB;  Service: Cardiovascular;  Laterality: [name];    No Known Allergies  Prior to Admission medications   Medication Sig Start Date End Date Taking? Authorizing Provider  acarbose (PRECOSE) 100 MG tablet Take 100 mg by mouth 3  (three) times daily. 02/20/14  Yes Historical Provider, MD  amiodarone (PACERONE) 400 MG tablet Take 200 mg by mouth daily.   Yes Historical Provider, MD  apixaban (ELIQUIS) 2.5 MG TABS tablet Take 2.5 mg by mouth 2 (two) times daily.   Yes Historical Provider, MD  aspirin EC 81 MG tablet Take 81 mg by mouth 2 (two) times a week. Mondays and thursdays   Yes Historical Provider, MD  atorvastatin (LIPITOR) 10 MG tablet Take 10 mg by mouth daily.   Yes Historical Provider, MD  carvedilol (COREG) 6.25 MG tablet Take 6.25 mg by mouth 2 (two) times daily with a meal.   Yes Historical Provider, MD  ferrous sulfate 325 (65 FE) MG tablet Take 325 mg by mouth daily with breakfast.   Yes Historical Provider, MD  finasteride (PROSCAR) 5 MG tablet Take 5 mg by mouth daily.    Yes Historical Provider, MD  fluticasone (FLONASE) 50 MCG/ACT nasal spray Place 2 sprays into both nostrils daily. 06/13/14  Yes Historical Provider, MD  furosemide (LASIX) 20 MG tablet Take 20 mg by mouth daily.   Yes Historical Provider, MD  glipiZIDE (GLUCOTROL) 10 MG tablet Take 20 mg by mouth 2 (two) times daily before a meal.    Yes Historical Provider, MD  levothyroxine (SYNTHROID, LEVOTHROID) 100 MCG tablet Take 100 mcg by mouth daily before breakfast.   Yes Historical Provider, MD  lisinopril (PRINIVIL,ZESTRIL) 5 MG tablet Take 5 mg by mouth daily.   Yes Historical Provider, MD  metFORMIN (GLUCOPHAGE) 500 MG tablet Take 500 mg by mouth daily. 03/12/14  Yes Historical Provider, MD  methylPREDNISolone (MEDROL DOSEPAK) 4 MG tablet follow package directions 07/17/14  Yes Thurnell Lose, MD  potassium chloride SA (K-DUR,KLOR-CON) 20 MEQ tablet Take 20 mEq by mouth 2 (two) times daily.   Yes Historical Provider, MD  senna-docusate (SENOKOT-S) 8.6-50 MG per tablet Take 1 tablet by mouth at bedtime as needed for mild constipation. 03/29/14  Yes Nishant Dhungel, MD  traMADol (ULTRAM) 50 MG tablet Take 1 tablet (50 mg total) by mouth every 6  (six) hours as needed. 07/13/14  Yes Julianne Rice, MD    History   Social History  . Marital Status: Married    Spouse Name: [name]    Number of Children: [name]  . Years of Education: [name]   Occupational History  . Not on file.   Social History Main Topics  . Smoking status: Former Smoker -- 0.30 packs/day    Types: Cigarettes    Quit date: 06/18/1946  . Smokeless tobacco: Never Used  . Alcohol Use: No  . Drug Use: No  . Sexual Activity: Not  on file   Other Topics Concern  . Not on file   Social History Narrative   ** Merged History Encounter **         Family History  Problem Relation Age of Onset  . Hypertension Mother   . Heart attack Brother   . Diabetes Son     ROS: [x]  Positive   [ ]  Negative   [ ]  All sytems reviewed and are negative  Cardiovascular: []  chest pain/pressure []  palpitations []  SOB lying flat []  DOE []  pain in legs while walking []  pain in legs at rest []  pain in legs at night []  non-healing ulcers []  hx of DVT []  swelling in legs  Pulmonary: []  productive cough []  asthma/wheezing []  home O2  Neurologic: []  weakness in []  arms []  legs []  numbness in []  arms []  legs []  hx of CVA []  mini stroke [] difficulty speaking or slurred speech []  temporary loss of vision in one eye []  dizziness  Hematologic: []  hx of cancer []  bleeding problems []  problems with blood clotting easily  Endocrine:   []  diabetes []  thyroid disease  GI []  vomiting blood []  blood in stool  GU: []  CKD/renal failure []  HD--[]  M/W/F or []  T/T/S []  burning with urination []  blood in urine  Psychiatric: []  anxiety []  depression  Musculoskeletal: []  arthritis []  joint pain  Integumentary: []  rashes []  ulcers  Constitutional: []  fever []  chills   Physical Examination  Filed Vitals:   07/26/14 1341  BP: 135/54  Pulse: 67  Temp: 98.1 F (36.7 C)  Resp: 18   Body mass index is 20.08 kg/(m^2).  General:  WD thin male in NAD, oriented  only to self Gait: Not observed HENT: normocephalic, tracheostomy Pulmonary: trach, clear to ausculation Cardiac: regular, rate and rhythm, systolic murmur,  no carotid bruits.  Abdomen: soft, NT/ND, no masses Skin: 4x 5 cm dry gangrenous wound to medial plantar of right foot around 1st metatarsal joint. Erythema to distal half of foot.  Vascular Exam/Pulses: 1+ radial pulses bilaterally. 2+ femoral pulses bilaterally. Non palpable pedal pulses bilaterally.  Musculoskeletal: muscle atrophy of lower extremities bilaterally  Neurologic: oriented to self only, Appropriate Affect ; SENSATION: normal; MOTOR FUNCTION:  moving all extremities equally. Speech is fluent.    CBC    Component Value Date/Time   WBC 12.7* 07/26/2014 0540   RBC 3.70* 07/26/2014 0540   RBC 3.79* 03/17/2014 1655   HGB 11.5* 07/26/2014 0540   HCT 34.9* 07/26/2014 0540   PLT 369 07/26/2014 0540   MCV 94.3 07/26/2014 0540   MCH 31.1 07/26/2014 0540   MCHC 33.0 07/26/2014 0540   RDW 13.8 07/26/2014 0540   LYMPHSABS 0.9 07/25/2014 0440   MONOABS 1.1* 07/25/2014 0440   EOSABS 0.0 07/25/2014 0440   BASOSABS 0.0 07/25/2014 0440    BMET    Component Value Date/Time   NA 135 07/26/2014 0540   K 4.4 07/26/2014 0540   CL 98 07/26/2014 0540   CO2 32 07/26/2014 0540   GLUCOSE 203* 07/26/2014 0540   BUN 26* 07/26/2014 0540   CREATININE 1.22 07/26/2014 0540   CALCIUM 8.4 07/26/2014 0540   GFRNONAA 51* 07/26/2014 0540   GFRAA 59* 07/26/2014 0540    COAGS: Lab Results  Component Value Date   INR 0.85 06/23/2013   INR 1.03 11/06/2012   INR 0.91 04/26/2011    Statin:  Yes.   Beta Blocker:  Yes.   Aspirin:  Yes.   ACEI:  Yes.  ARB:  No. Other antiplatelets/anticoagulants:  Yes.  Eliquis.    ASSESSMENT/PLAN: This is a 81 y.o. male with dry gangrene of right foot. His ABIs are 0.36 right and 0.89 left. Negative for osteomyelitis. Continue antibiotics.  He was last evaluated by Dr Bridgett Larsson back in May 2013 for  bilateral leg pain. He had a prior arteriogram with bilateral runoff by Dr. Irish Lack in March 2013 which revealed extensive tibial disease bilaterally. Dr. Bridgett Larsson only performed an arteriogram with left leg runoff due to presence of left foot ulcer which subsequently healed. At that time, Dr. Bridgett Larsson did not believe his right extremity had any revascularization targets from arteriogram results in March 2013.   The patient will likely need a below knee amputation at the very minimum. The patient states that he is active at home so attempt at revascularization may be warranted but is unlikely due to history of severe tibial disease on prior studies. Will need to discuss with patient's family for consent as patient is not oriented enough to make decisions. Dr. Oneida Alar to evaluate.   Virgina Jock, PA-C [facility] Office: [phone] Pager: [phone]  History and exam details as above.  Pt has dementia, oriented only to person.  Thinks it is 1978.  Knew he was in [place] but did not know he was in a hospital.  No real insight into her current situation.   On Exam: gangrenous changes right forefoot first metatarsal 1-2 + right femoral pulse with calcified vessel, 2+ left femoral pulse, no pop or pedal pulses  I reviewed the images from his two arteriograms in 2013. One by Dr Bridgett Larsson and one by Dr Irish Lack.  In the right leg his profuda was patent. His right SFA occluded after about 5 cm then he had a long occlusion of the rest of his SFA his entire popliteal artery and the anterior tibial and peroneal arteries.  There was 1 vessel runoff via a very diseased PT artery.  In the left leg, the left SFA had a flush occlusion the profunda was patent, there was a long occlusion of the entire SFA and popliteal artery with 1 vessel PT runoff.  He was not a candidate for percutaneous revascularization at that time due to the extent of the occlusions.    A: Gangrene right foot.  Pt not a candidate  for percutaneous revascularization due to extensive disease in 2013 which I am sure has progressed over the last 3 years and certainly not improved.  Pt is not a candidate for bypass due to multiple co-morbidities especially his dementia.  He also has no available vein conduit for tibial bypass and PTFE for tibial bypass has extremely limited durability.  P: discussed possibility of amputation right leg with pt but he really does not understand the situation.  Spoke with Inez Catalina and Tilmon Wisehart this evening his family members are going to discuss options and get back to [place].  No reason to repeat arteriogram based on prior study.  If family wishes to proceed with amputation it could be done this hospital admission.  Will check Albumin and prealbumin in am to assess nutritional status.  Ruta Hinds, MD [facility] [place] Office: [phone] Pager: [phone]

## 2014-07-26 NOTE — Progress Notes (Signed)
Inpatient Diabetes Program Recommendations  AACE/ADA: New Consensus Statement on Inpatient Glycemic Control (2013)  Target Ranges:  Prepandial:   less than 140 mg/dL      Peak postprandial:   less than 180 mg/dL (1-2 hours)      Critically ill patients:  140 - 180 mg/dL   Reason for Assessment:  Results for [name], [name] (MRN [id number]) as of 07/26/2014 12:51  Ref. Range 07/25/2014 07:38 07/25/2014 12:26 07/25/2014 17:49 07/25/2014 22:03 07/26/2014 07:41  Glucose-Capillary Latest Range: 70-99 mg/dL 143 (H) 223 (H) 198 (H) 183 (H) 204 (H)   Diabetes history: Diabetes Mellitus Outpatient Diabetes medications: Precose 100 mg tid with meals, Metformin 500 mg daily, Glucotrol 20 mg bid Current orders for Inpatient glycemic control:  Novolog sensitive tid with meals, Precose 100 mg tid with meals  May consider adding low dose basal insulin while patient is in the hospital.  Consider Levemir 8 units daily.   Thanks, Adah Perl, RN, BC-ADM Inpatient Diabetes Coordinator Pager [phone]

## 2014-07-26 NOTE — Progress Notes (Signed)
Stoma appears slightly reddened, no swelling, or secretions noted. Pt on room air no distress noted.

## 2014-07-26 NOTE — Care Management Note (Signed)
  Page 1 of 1   08/02/2014     11:49:06 AM CARE MANAGEMENT NOTE 08/02/2014  Patient:  [name], [name]   Account Number:  [id number]  Date Initiated:  07/26/2014  Documentation initiated by:  Magdalen Spatz  Subjective/Objective Assessment:   Right foot cellulitis with gangrenous ulcer of the plantar aspect of first metatarsal head:     Action/Plan:   IV ABX    Possible amputation   Anticipated DC Date:  08/02/2014   Anticipated DC Plan:  SKILLED NURSING FACILITY  In-house referral  Clinical Social Worker         Choice offered to / List presented to:             Status of service:   Medicare Important Message given?  YES (If response is "NO", the following Medicare IM given date fields will be blank) Date Medicare IM given:  08/02/2014 Medicare IM given by:  Magdalen Spatz Date Additional Medicare IM given:  07/30/2014 Additional Medicare IM given by:  Magdalen Spatz  Discharge Disposition:  [place]  Per UR Regulation:  Reviewed for med. necessity/level of care/duration of stay  If discussed at [place] of Stay Meetings, dates discussed:   07/29/2014    Comments:

## 2014-07-26 NOTE — Consult Note (Addendum)
Primary Physician:  Felipa Eth   Primary Cardiologist:  Irish Lack     HPI: Patient is an 81 yo who we are asked to see for preop risk stratification   H was admitted for cellulits of R foot.  Hix of afib, ICM (s/p CABG 1996; s/p PCI of SVG to OM 2007LVEF 25 to 30%, NSTEMI in AUg 2014;  Cath at that time showed:  LAD 100%  LCx 100%  Fills via SVG  80% mid; RCA 99% prox; SVG to OM 99%; SVG to diag and LAD 40% ; SVG to PDA/OM patent  Patient underent PCI with DES to SVG to OM);  Also a hsitory of  PVOD, DM,  Patient was last seen in cardiology clinic in October  He denies CP  Breathing is OK  No dizziness  His activity is limited  He uses a walker        Past Medical History  Diagnosis Date  . Coronary artery disease     s/p CABG 1996 then PCI of SVG to OM 2007  . Diverticulosis   . Dyslipidemia   . Renal artery stenosis     s/p bilateral stents  . Hiatal hernia   . Colon polyps   . Carotid artery stenosis     50% BILATERAL 2014  . Peripheral neuropathy   . Anemia   . Hypothyroidism   . Nasal polyps   . Hypertension   . Diabetes mellitus   . Throat cancer S/P TRACHECTOMY   . Myocardial infarction   . Peripheral vascular disease     LOWER EXT  . Atrial fibrillation/flutter Aug 2015  . GERD (gastroesophageal reflux disease)   . Shortness of breath     Hx: of  . HOH (hard of hearing) VERY   . Left bundle branch block (LBBB) on electrocardiogram 03/17/2014  . Ischemic cardiomyopathy Aug 2015    EF 25-30%   . History of throat cancer     s/p trach  . Aortic stenosis Aug 2015    mild to moderate  . Chronic anticoagulation Aug 2015    Eliquis  . CHF (congestive heart failure)     Medications Prior to Admission  Medication Sig Dispense Refill  . acarbose (PRECOSE) 100 MG tablet Take 100 mg by mouth 3 (three) times daily.    Marland Kitchen amiodarone (PACERONE) 400 MG tablet Take 200 mg by mouth daily.    Marland Kitchen apixaban (ELIQUIS) 2.5 MG TABS tablet Take 2.5 mg by mouth 2 (two) times  daily.    Marland Kitchen aspirin EC 81 MG tablet Take 81 mg by mouth 2 (two) times a week. Mondays and thursdays    . atorvastatin (LIPITOR) 10 MG tablet Take 10 mg by mouth daily.    . carvedilol (COREG) 6.25 MG tablet Take 6.25 mg by mouth 2 (two) times daily with a meal.    . ferrous sulfate 325 (65 FE) MG tablet Take 325 mg by mouth daily with breakfast.    . finasteride (PROSCAR) 5 MG tablet Take 5 mg by mouth daily.     . fluticasone (FLONASE) 50 MCG/ACT nasal spray Place 2 sprays into both nostrils daily.  11  . furosemide (LASIX) 20 MG tablet Take 20 mg by mouth daily.    Marland Kitchen glipiZIDE (GLUCOTROL) 10 MG tablet Take 20 mg by mouth 2 (two) times daily before a meal.     . levothyroxine (SYNTHROID, LEVOTHROID) 100 MCG tablet Take 100 mcg by mouth daily before breakfast.    .  lisinopril (PRINIVIL,ZESTRIL) 5 MG tablet Take 5 mg by mouth daily.    . metFORMIN (GLUCOPHAGE) 500 MG tablet Take 500 mg by mouth daily.    . methylPREDNISolone (MEDROL DOSEPAK) 4 MG tablet follow package directions 21 tablet 0  . potassium chloride SA (K-DUR,KLOR-CON) 20 MEQ tablet Take 20 mEq by mouth 2 (two) times daily.    Marland Kitchen senna-docusate (SENOKOT-S) 8.6-50 MG per tablet Take 1 tablet by mouth at bedtime as needed for mild constipation. 20 tablet 0  . traMADol (ULTRAM) 50 MG tablet Take 1 tablet (50 mg total) by mouth every 6 (six) hours as needed. 15 tablet 0     . acarbose  100 mg Oral TID WC  . amiodarone  200 mg Oral Daily  . apixaban  2.5 mg Oral BID  . aspirin EC  81 mg Oral Once per day on Mon Thu  . atorvastatin  10 mg Oral Daily  . carvedilol  6.25 mg Oral BID WC  . ferrous sulfate  325 mg Oral Q breakfast  . finasteride  5 mg Oral Daily  . fluticasone  2 spray Each Nare Daily  . furosemide  20 mg Oral Daily  . insulin aspart  0-5 Units Subcutaneous QHS  . insulin aspart  0-9 Units Subcutaneous TID WC  . levothyroxine  100 mcg Oral QAC breakfast  . lisinopril  5 mg Oral Daily  . piperacillin-tazobactam  (ZOSYN)  IV  3.375 g Intravenous 3 times per day  . potassium chloride SA  20 mEq Oral BID  . sodium chloride  3 mL Intravenous Q12H  . sodium chloride  3 mL Intravenous Q12H  . vancomycin  750 mg Intravenous Q24H    Infusions:    No Known Allergies  History   Social History  . Marital Status: Married    Spouse Name: [name]    Number of Children: [name]  . Years of Education: [name]   Occupational History  . Not on file.   Social History Main Topics  . Smoking status: Former Smoker -- 0.30 packs/day    Types: Cigarettes    Quit date: 06/18/1946  . Smokeless tobacco: Never Used  . Alcohol Use: No  . Drug Use: No  . Sexual Activity: Not on file   Other Topics Concern  . Not on file   Social History Narrative   ** Merged History Encounter **        Family History  Problem Relation Age of Onset  . Hypertension Mother   . Heart attack Brother   . Diabetes Son     REVIEW OF SYSTEMS:  All systems reviewed  Negative to the above problem except as noted above.    PHYSICAL EXAM: Filed Vitals:   07/26/14 0556  BP: 159/61  Pulse: 62  Temp: 97.5 F (36.4 C)  Resp: 16     Intake/Output Summary (Last 24 hours) at 07/26/14 0843 Last data filed at 07/26/14 0559  Gross per 24 hour  Intake    240 ml  Output   1745 ml  Net  -1505 ml    General:  Thin 81 yo in NAD   HEENT: normal Neck: supple. no JVD. Carotids 2+ bilat; no bruits. S/p trach.   Cor: PMI nondisplaced. Regular rate & rhythm. No rubs, gallops or murmurs. Lungs: clear Abdomen: soft, nontender, nondistended. No hepatosplenomegaly. No bruits or masses. Good bowel sounds. Extremities: No edema  R metatarsal with gangrenous changes and surrounding erythema  0-Tr DP bilaterally  Neuro: alert & oriented x 3, cranial nerves grossly intact. moves all 4 extremities w/o difficulty. Affect pleasant.  ECG:  SB 58  LBBB    Results for orders placed or performed during the hospital encounter of 07/24/14 (from the past  24 hour(s))  Glucose, capillary     Status: Abnormal   Collection Time: 07/25/14 12:26 PM  Result Value Ref Range   Glucose-Capillary 223 (H) 70 - 99 mg/dL   Comment 1 Notify RN   TSH     Status: None   Collection Time: 07/25/14  1:30 PM  Result Value Ref Range   TSH 3.181 0.350 - 4.500 uIU/mL  Vitamin B12     Status: None   Collection Time: 07/25/14  1:50 PM  Result Value Ref Range   Vitamin B-12 276 211 - 911 pg/mL  Glucose, capillary     Status: Abnormal   Collection Time: 07/25/14  5:49 PM  Result Value Ref Range   Glucose-Capillary 198 (H) 70 - 99 mg/dL   Comment 1 Notify RN   Glucose, capillary     Status: Abnormal   Collection Time: 07/25/14 10:03 PM  Result Value Ref Range   Glucose-Capillary 183 (H) 70 - 99 mg/dL   Comment 1 Notify RN   CBC     Status: Abnormal   Collection Time: 07/26/14  5:40 AM  Result Value Ref Range   WBC 12.7 (H) 4.0 - 10.5 K/uL   RBC 3.70 (L) 4.22 - 5.81 MIL/uL   Hemoglobin 11.5 (L) 13.0 - 17.0 g/dL   HCT 34.9 (L) 39.0 - 52.0 %   MCV 94.3 78.0 - 100.0 fL   MCH 31.1 26.0 - 34.0 pg   MCHC 33.0 30.0 - 36.0 g/dL   RDW 13.8 11.5 - 15.5 %   Platelets 369 150 - 400 K/uL  Basic metabolic panel     Status: Abnormal   Collection Time: 07/26/14  5:40 AM  Result Value Ref Range   Sodium 135 135 - 145 mmol/L   Potassium 4.4 3.5 - 5.1 mmol/L   Chloride 98 96 - 112 mmol/L   CO2 32 19 - 32 mmol/L   Glucose, Bld 203 (H) 70 - 99 mg/dL   BUN 26 (H) 6 - 23 mg/dL   Creatinine, Ser 1.22 0.50 - 1.35 mg/dL   Calcium 8.4 8.4 - 10.5 mg/dL   GFR calc non Af Amer 51 (L) >90 mL/min   GFR calc Af Amer 59 (L) >90 mL/min   Anion gap 5 5 - 15  Glucose, capillary     Status: Abnormal   Collection Time: 07/26/14  7:41 AM  Result Value Ref Range   Glucose-Capillary 204 (H) 70 - 99 mg/dL   Dg Foot Complete Right  07/24/2014   CLINICAL DATA:  Right foot pain, plantar surface ulcer beneath the great toe  EXAM: RIGHT FOOT COMPLETE - 3+ VIEW  COMPARISON:  07/16/2014   FINDINGS: There has been interval development of soft tissue gas and swelling in the plantar soft tissues at the level of the first metatarsal-phalangeal joint. Vascular calcifications are noted. No osseous destruction, sclerosis, fracture, or dislocation. No new radiopaque foreign body.  IMPRESSION: New soft tissue gas and swelling within the plantar surface of the foot subjacent to the first metatarsal-phalangeal joint. This predisposes the patient to risk for osteomyelitis.   Electronically Signed   By: Conchita Paris M.D.   On: 07/24/2014 19:31     ASSESSMENT: 81 yo with CAD, chronic systolic  CHF, PAF, PVOD, HTN, aortic stenosis  Admitted with gangrenous changes to foot He denies CP though he is not that active Cardiac exam without evid of volume overload EKG with SR  LBBB(old)  Impression He is at low risk for planned angiogram of LE He would be at least moderate risk for cardiac complications for either revascularization or amputation.  I do not think cardiac testing would lower this and I think it is ok to proceed.  Would continue telemetry.  Will need close f/u in periprocedure period     Discussed with patient  He understands  He is in significant pain and wants to have something done to improve things.    Would keep on ASA  Will need to d/c eliquis   2.  PVOD  As above   3  Chronic systolic CHF  Volume  Status good    4  Atrial fib  Continue amiodarone  Continue tele.    5.  HTN  Follow   6  HL  Continue statin.   6  Aortic stenosis Will review echo.  Dorris Carnes

## 2014-07-27 ENCOUNTER — Encounter (HOSPITAL_COMMUNITY)
Admission: EM | Disposition: A | Discharge status: Hospice | Payer: Self-pay | Source: Home / Self Care | Attending: Internal Medicine

## 2014-07-27 DIAGNOSIS — I5022 Chronic systolic (congestive) heart failure: Secondary | ICD-10-CM

## 2014-07-27 DIAGNOSIS — I739 Peripheral vascular disease, unspecified: Secondary | ICD-10-CM

## 2014-07-27 DIAGNOSIS — L03115 Cellulitis of right lower limb: Secondary | ICD-10-CM

## 2014-07-27 DIAGNOSIS — I350 Nonrheumatic aortic (valve) stenosis: Secondary | ICD-10-CM

## 2014-07-27 LAB — BASIC METABOLIC PANEL
ANION GAP: 11 (ref 5–15)
Anion gap: 8 (ref 5–15)
BUN: 29 mg/dL — ABNORMAL HIGH (ref 6–23)
BUN: 28 mg/dL — ABNORMAL HIGH (ref 6–23)
CALCIUM: 7.9 mg/dL — AB (ref 8.4–10.5)
CO2: 27 mmol/L (ref 19–32)
CO2: 27 mmol/L (ref 19–32)
CREATININE: 1.26 mg/dL (ref 0.50–1.35)
Calcium: 8.2 mg/dL — ABNORMAL LOW (ref 8.4–10.5)
Chloride: 98 mmol/L (ref 96–112)
Chloride: 97 mmol/L (ref 96–112)
Creatinine, Ser: 1.37 mg/dL — ABNORMAL HIGH (ref 0.50–1.35)
GFR calc Af Amer: 51 mL/min — ABNORMAL LOW (ref >90)
GFR calc Af Amer: 57 mL/min — ABNORMAL LOW (ref >90)
GFR calc non Af Amer: 44 mL/min — ABNORMAL LOW (ref >90)
GFR calc non Af Amer: 49 mL/min — ABNORMAL LOW (ref >90)
Glucose, Bld: 278 mg/dL — ABNORMAL HIGH (ref 70–99)
Glucose, Bld: 150 mg/dL — ABNORMAL HIGH (ref 70–99)
POTASSIUM: 4 mmol/L (ref 3.5–5.1)
Potassium: 4.2 mmol/L (ref 3.5–5.1)
SODIUM: 133 mmol/L — AB (ref 135–145)
Sodium: 135 mmol/L (ref 135–145)

## 2014-07-27 LAB — GLUCOSE, CAPILLARY
Glucose-Capillary: 396 mg/dL — ABNORMAL HIGH (ref 70–99)
Glucose-Capillary: 105 mg/dL — ABNORMAL HIGH (ref 70–99)
Glucose-Capillary: 189 mg/dL — ABNORMAL HIGH (ref 70–99)
Glucose-Capillary: 315 mg/dL — ABNORMAL HIGH (ref 70–99)
Glucose-Capillary: 204 mg/dL — ABNORMAL HIGH (ref 70–99)
Glucose-Capillary: 118 mg/dL — ABNORMAL HIGH (ref 70–99)

## 2014-07-27 LAB — ALBUMIN: Albumin: 2.6 g/dL — ABNORMAL LOW (ref 3.5–5.2)

## 2014-07-27 LAB — CBC
HEMATOCRIT: 32.8 % — AB (ref 39.0–52.0)
HEMOGLOBIN: 11 g/dL — AB (ref 13.0–17.0)
MCH: 30.8 pg (ref 26.0–34.0)
MCHC: 33.5 g/dL (ref 30.0–36.0)
MCV: 91.9 fL (ref 78.0–100.0)
Platelets: 345 10*3/uL (ref 150–400)
RBC: 3.57 MIL/uL — AB (ref 4.22–5.81)
RDW: 13.8 % (ref 11.5–15.5)
WBC: 12.6 10*3/uL — AB (ref 4.0–10.5)

## 2014-07-27 LAB — WOUND CULTURE: Gram Stain: NONE SEEN

## 2014-07-27 LAB — PREALBUMIN: PREALBUMIN: 15.1 mg/dL — AB (ref 17.0–34.0)

## 2014-07-27 SURGERY — ABDOMINAL AORTAGRAM
Anesthesia: LOCAL

## 2014-07-27 MED ORDER — CEFAZOLIN SODIUM 1-5 GM-% IV SOLN
1.0000 g | Freq: Three times a day (TID) | INTRAVENOUS | Status: DC
  Administered 2014-07-27 – 2014-07-28 (×4): 1 g via INTRAVENOUS
  Filled 2014-07-27 (×6): qty 50

## 2014-07-27 MED ORDER — INSULIN DETEMIR 100 UNIT/ML ~~LOC~~ SOLN
10.0000 [IU] | Freq: Every day | SUBCUTANEOUS | Status: DC
  Administered 2014-07-27: 10 [IU] via SUBCUTANEOUS
  Filled 2014-07-27 (×2): qty 0.1

## 2014-07-27 NOTE — Progress Notes (Signed)
[facility] PROGRESS NOTE  [name] YTK:[id number] DOB: 01-25-1923 DOA: 07/24/2014 PCP: Mathews Argyle, MD  Assessment/Plan: 1-Right foot cellulitis with gangrenous ulcer of the plantar aspect of first metatarsal head:  -foot ulcer secondary to Diabetes with PVD and gangrene.  -Ortho consulted. Dr Sharol Given recommend cardiology consultation for arteriogram. If patient is not candidate for revascularization patient might require a transtibial amputation.  -no need for repeating arteriogram per Dr field.  -Continue with IV antibiotics. Day 3. Change to Ancef, culture growing MSSA.  -MRI ordered.  -WBC trending down. 18---12.  -Dr Oneida Alar is awaiting decision from family regarding proceeding with Above Knee amputation.   2-A fib; continue with amiodarone, carvedilol. Hold eliquis in anticipation to surgery/   3-Diabetes: hold glipizide. Continue with SSI. Will add low dose levemir while inpatient.   4-HLD; continue with Lipitor.   5-Hypothyroidism; continue with synthroid.   6-Systolic HF, Ef 25 to 30 %; by ECHO 2015. Appears compensated.  Continue with lasix. Hold lisinopril due to raising Cr.   7-Throat Cancer, S/P trach; Patient need to have TEP exchange. I spoke with Dr Erik Obey, ([phone] phone). He agree to see patient when the Prothesis TEP is here. He asked also that the family get the insertion tools also. I spoke with  Patient son [name]. He will try to get the TEP and insertion tools. Please call Dr Erik Obey when family brings the prothesis.     Code Status: Full Code.  Family Communication: Care discussed with Daughter in [place].  Disposition Plan: might need SNF   Consultants:  Ortho, Dr Sharol Given.   Procedures:  ABI; ordered.   Antibiotics:  Vancomycin 2-6  Zosyn 2-6  HPI/Subjective: No complaints. No dyspnea.   Objective: Filed Vitals:   07/27/14 1422  BP: 130/57  Pulse: 72  Temp: 97.8 F (36.6 C)  Resp: 18    Intake/Output Summary (Last 24  hours) at 07/27/14 1424 Last data filed at 07/27/14 0809  Gross per 24 hour  Intake    690 ml  Output   1300 ml  Net   -610 ml   Filed Weights   07/24/14 1527 07/26/14 0556 07/27/14 0500  Weight: 65.772 kg (145 lb) 59.9 kg (132 lb 0.9 oz) 60.2 kg (132 lb 11.5 oz)    Exam:   General:  Alert, cachetic, in no distress. trach  Cardiovascular: S 1, S 2 RRR  Respiratory: CTA  Abdomen: BS, present, nt  Musculoskeletal: no edema.   Extremity: right gangrenous ulcer plantar aspect of first metatarsal. Redness right foot has decreased.   Data Reviewed: Basic Metabolic Panel:  Recent Labs Lab 07/24/14 1552 07/25/14 0440 07/26/14 0540 07/27/14 0451  NA 138 137 135 133*  K 4.3 4.1 4.4 4.0  CL 98 99 98 98  CO2 31 31 32 27  GLUCOSE 192* 172* 203* 278*  BUN 33* 35* 26* 29*  CREATININE 1.25 1.25 1.22 1.37*  CALCIUM 9.0 8.2* 8.4 7.9*   Liver Function Tests:  Recent Labs Lab 07/24/14 1552 07/25/14 0440 07/27/14 0451  AST 15 13  --   ALT 19 16  --   ALKPHOS 95 78  --   BILITOT 0.5 0.3  --   PROT 6.8 5.7*  --   ALBUMIN 3.2* 2.6* 2.6*   No results for input(s): LIPASE, AMYLASE in the last 168 hours. No results for input(s): AMMONIA in the last 168 hours. CBC:  Recent Labs Lab 07/24/14 1552 07/25/14 0440 07/26/14 0540 07/27/14 0451  WBC 18.4* 16.5* 12.7*  12.6*  NEUTROABS 17.0* 14.6*  --   --   HGB 12.3* 10.5* 11.5* 11.0*  HCT 36.4* 31.7* 34.9* 32.8*  MCV 92.9 93.2 94.3 91.9  PLT 408* 362 369 345   Cardiac Enzymes: No results for input(s): CKTOTAL, CKMB, CKMBINDEX, TROPONINI in the last 168 hours. BNP (last 3 results)  Recent Labs  07/12/14 2121  BNP 315.7*    ProBNP (last 3 results)  Recent Labs  02/12/14 0728 03/17/14 1030 03/18/14 0320  PROBNP 9440.0* 5030.0* 7135.0*    CBG:  Recent Labs Lab 07/25/14 1226 07/25/14 1749 07/25/14 2203 07/26/14 0741 07/27/14 1204  GLUCAP 223* 198* 183* 204* 315*    Recent Results (from the past 240  hour(s))  Culture, blood (routine x 2)     Status: None (Preliminary result)   Collection Time: 07/24/14  6:30 PM  Result Value Ref Range Status   Specimen Description BLOOD RIGHT ARM  Final   Special Requests   Final    BOTTLES DRAWN AEROBIC AND ANAEROBIC 4CC BLUE [place] RED   Culture   Final           BLOOD CULTURE RECEIVED NO GROWTH TO DATE CULTURE WILL BE HELD FOR 5 DAYS BEFORE ISSUING A FINAL NEGATIVE REPORT Note: Culture results may be compromised due to an excessive volume of blood received in culture bottles. Performed at [facility]    Report Status PENDING  Incomplete  Culture, blood (routine x 2)     Status: None (Preliminary result)   Collection Time: 07/24/14  6:34 PM  Result Value Ref Range Status   Specimen Description BLOOD LEFT ARM  Final   Special Requests BOTTLES DRAWN AEROBIC AND ANAEROBIC 10CC  Final   Culture   Final           BLOOD CULTURE RECEIVED NO GROWTH TO DATE CULTURE WILL BE HELD FOR 5 DAYS BEFORE ISSUING A FINAL NEGATIVE REPORT Note: Culture results may be compromised due to an inadequate volume of blood received in culture bottles. Performed at [facility]    Report Status PENDING  Incomplete  Wound culture     Status: None   Collection Time: 07/24/14  6:40 PM  Result Value Ref Range Status   Specimen Description WOUND RIGHT FOOT  Final   Special Requests NONE  Final   Gram Stain   Final    NO WBC SEEN NO SQUAMOUS EPITHELIAL CELLS SEEN FEW GRAM POSITIVE COCCI IN PAIRS Performed at [facility]    Culture   Final    ABUNDANT STAPHYLOCOCCUS AUREUS Note: RIFAMPIN AND GENTAMICIN SHOULD NOT BE USED AS SINGLE DRUGS FOR TREATMENT OF STAPH INFECTIONS. Performed at [facility]    Report Status 07/27/2014 FINAL  Final   Organism ID, Bacteria STAPHYLOCOCCUS AUREUS  Final      Susceptibility   Staphylococcus aureus - MIC*    CLINDAMYCIN <=0.25 SENSITIVE Sensitive     ERYTHROMYCIN <=0.25 SENSITIVE Sensitive      GENTAMICIN <=0.5 SENSITIVE Sensitive     LEVOFLOXACIN 0.25 SENSITIVE Sensitive     OXACILLIN 0.5 SENSITIVE Sensitive     PENICILLIN >=0.5 RESISTANT Resistant     RIFAMPIN <=0.5 SENSITIVE Sensitive     TRIMETH/SULFA <=10 SENSITIVE Sensitive     VANCOMYCIN 1 SENSITIVE Sensitive     TETRACYCLINE >=16 RESISTANT Resistant     MOXIFLOXACIN <=0.25 SENSITIVE Sensitive     * ABUNDANT STAPHYLOCOCCUS AUREUS     Studies: No results found.  Scheduled Meds: .  acarbose  100 mg Oral TID WC  . amiodarone  200 mg Oral Daily  . aspirin EC  81 mg Oral Once per day on Mon Thu  . atorvastatin  10 mg Oral Daily  . carvedilol  6.25 mg Oral BID WC  .  ceFAZolin (ANCEF) IV  1 g Intravenous 3 times per day  . ferrous sulfate  325 mg Oral Q breakfast  . finasteride  5 mg Oral Daily  . fluticasone  2 spray Each Nare Daily  . furosemide  20 mg Oral Daily  . insulin aspart  0-5 Units Subcutaneous QHS  . insulin aspart  0-9 Units Subcutaneous TID WC  . levothyroxine  100 mcg Oral QAC breakfast  . sodium chloride  3 mL Intravenous Q12H  . sodium chloride  3 mL Intravenous Q12H   Continuous Infusions:   Active Problems:   Peripheral vascular disease   DM (diabetes mellitus)   Cardiomyopathy- EF 25-30%   History of throat cancer   Cellulitis   Hypomagnesemia   Anemia   Diabetic ulcer of right foot    Time spent: 35 minutes.     Niel Hummer A  [facility] Pager [phone]. If 7PM-7AM, please contact night-coverage at [email or web address], password [facility] 07/27/2014, 2:24 PM  LOS: 3 days

## 2014-07-27 NOTE — Consult Note (Signed)
Discussed options of medical management with antibiotics versus right above knee amputation with patient and several family members at bedside.  They are going to think about options.  Pt also requesting possible ENT eval to replace his tracheostomy.  He was scheduled to have a new tracheostomy placed in [place] but missed appt while here.  Pt son is going to try to pick up appliance in [place].  Pt has requested Dr Erik Obey to eval if they can get the device.  Ruta Hinds, MD [facility] Office: [phone] Pager: [phone]

## 2014-07-27 NOTE — Clinical Social Work Placement (Addendum)
Clinical Social Work Department CLINICAL SOCIAL WORK PLACEMENT NOTE 07/27/2014  Patient:  [name], [name]  Account Number:  [id number] Admit date:  07/24/2014  Clinical Social Worker:  Deanna Boehlke, LCSWA  Date/time:  07/27/2014 12:53 PM  Clinical Social Work is seeking post-discharge placement for this patient at the following level of care:   [place]   (*CSW will update this form in Epic as items are completed)   07/26/2014  Patient/family provided with [place] Department of Clinical Social Work's list of facilities offering this level of care within the geographic area requested by the patient (or if unable, by the patient's family).  07/26/2014  Patient/family informed of their freedom to choose among providers that offer the needed level of care, that participate in Medicare, Medicaid or managed care program needed by the patient, have an available bed and are willing to accept the patient.  07/26/2014  Patient/family informed of [facility]' ownership interest in [facility], as well as of the fact that they are under no obligation to receive care at this facility.  PASARR submitted to EDS on 07/27/2014 PASARR number received on 07/27/2014  FL2 transmitted to all facilities in geographic area requested by pt/family on  07/27/2014 FL2 transmitted to all facilities within larger geographic area on 07/27/2014  Patient informed that his/her managed care company has contracts with or will negotiate with  certain facilities, including the following:     Patient/family informed of bed offers received:  07/28/14  Patient chooses bed at [facility] Physician recommends and patient chooses bed at    Patient to be transferred to [facility] on 08/02/14   Patient to be transferred to facility by [facility] Patient and family notified of transfer on 08/02/14 Name of family member notified: [name]   The following physician request were entered in  Epic:  Additional Comments:  Jones Broom. [place], MSW, [place] 08/02/2014 10:55 AM

## 2014-07-27 NOTE — Care Management (Signed)
Medicare IM given . Magdalen Spatz RN BSN

## 2014-07-27 NOTE — Clinical Social Work Note (Signed)
Contacted patient's daughter in law Inez Catalina to inform her of bed offers.  Patient's family would like patient to go to [facility] once he is medically ready and discharge orders have been received.  [name]. [place], MSW, [place] 07/27/2014 5:11 PM

## 2014-07-27 NOTE — Evaluation (Signed)
Physical Therapy Evaluation Patient Details Name: [name] MRN: [id number] DOB: Jun 12, 1923 Today's Date: 07/27/2014   History of Present Illness  Adm 07/24/14 with Rt foot cellulitis and ?osteomyelitis (pt refused MRI and angiogram). Has not yet agreed to BKA (as recommended by Vascular)  PMHx- throat Ca with trach, PVD, CAD/CABG, afib, CHF with EF 25%, DM, HOH    Clinical Impression  Pt admitted with above diagnosis. Pt currently with functional limitations due to the deficits listed below (see PT Problem List). Pt will benefit from skilled PT to increase their independence and safety with mobility to allow discharge to the venue listed below.       Follow Up Recommendations Home health PT;Supervision - Intermittent If pt decides to have BKA, will need reassessment of d/c plan    Equipment Recommendations  None recommended by PT    Recommendations for Other Services OT consult     Precautions / Restrictions Precautions Precautions: Fall      Mobility  Bed Mobility Overal bed mobility: Modified Independent                Transfers Overall transfer level: Needs assistance Equipment used: Rolling walker (2 wheeled) Transfers: Sit to/from Stand Sit to Stand: Min assist         General transfer comment: vc and visual demo of safe use of RW (pt with improper hand placement)  Ambulation/Gait Ambulation/Gait assistance: Min guard Ambulation Distance (Feet): 65 Feet Assistive device: Rolling walker (2 wheeled) Gait Pattern/deviations: Step-to pattern;Trunk flexed     General Gait Details: educated on step-to pattern with RLE lead and weight bear thru Rt heel only (holding toes up)  Stairs            Wheelchair Mobility    Modified Rankin (Stroke Patients Only)       Balance Overall balance assessment: Needs assistance Sitting-balance support: No upper extremity supported;Feet unsupported Sitting balance-Leahy Scale: Good Sitting balance - Comments:  donned socks without UE or LE support   Standing balance support: No upper extremity supported Standing balance-Leahy Scale: Fair                               Pertinent Vitals/Pain Pain Assessment: Faces Faces Pain Scale: Hurts little more Pain Location: Rt foot (with walking; no pain at rest) Pain Intervention(s): Limited activity within patient's tolerance;Monitored during session;Repositioned    Home Living Family/patient expects to be discharged to:: Private residence Living Arrangements: Alone Available Help at Discharge: Family;Available PRN/intermittently Type of Home: House Home Access: Stairs to enter;Ramped entrance     Home Layout: One level Home Equipment: Cane - single point;Walker - 4 wheels;Bedside commode;Shower seat (BSC over toilet) Additional Comments: pt prepares own breakfast, goes to daughter's for supper    Prior Function Level of Independence: Independent         Comments: driving until 2 weeks ago; states he uses cane "sometimes"     Hand Dominance   Dominant Hand: Right    Extremity/Trunk Assessment   Upper Extremity Assessment: Generalized weakness           Lower Extremity Assessment: Generalized weakness      Cervical / Trunk Assessment: Kyphotic  Communication   Communication: HOH  Cognition Arousal/Alertness: Awake/alert Behavior During Therapy: WFL for tasks assessed/performed Overall Cognitive Status: History of cognitive impairments - at baseline (oriented to self only; answered home setup correct per son)       Memory:  Decreased short-term memory              General Comments General comments (skin integrity, edema, etc.): son arrived at end of session. Plans to speak to pt with MD re: amputation    Exercises        Assessment/Plan    PT Assessment Patient needs continued PT services  PT Diagnosis Difficulty walking;Acute pain   PT Problem List Decreased strength;Decreased activity  tolerance;Decreased balance;Decreased mobility;Decreased cognition;Decreased knowledge of use of DME;Decreased safety awareness;Decreased knowledge of precautions;Pain  PT Treatment Interventions DME instruction;Gait training;Functional mobility training;Therapeutic activities;Therapeutic exercise;Balance training;Cognitive remediation;Patient/family education   PT Goals (Current goals can be found in the Care Plan section) Acute Rehab PT Goals Patient Stated Goal: return home PT Goal Formulation: With patient Time For Goal Achievement: 08/03/14 Potential to Achieve Goals: Good    Frequency Min 3X/week   Barriers to discharge Decreased caregiver support      Co-evaluation               End of Session Equipment Utilized During Treatment: Gait belt Activity Tolerance: Patient tolerated treatment well Patient left: in chair;with call bell/phone within reach;with family/visitor present Nurse Communication: Mobility status         Time: 6073-7106 PT Time Calculation (min) (ACUTE ONLY): 23 min   Charges:   PT Evaluation $Initial PT Evaluation Tier I: 1 Procedure PT Treatments $Gait Training: 8-22 mins   PT G Codes:        Myia Bergh Aug 03, 2014, 11:20 AM Pager [phone]

## 2014-07-27 NOTE — Clinical Documentation Improvement (Signed)
Presents with Diabetic right foot ulcer. Has numerous conditions such as neuropathy, cellulitis, gangrene, pvd documented in chart.  Please clarify if any of the above listed diagnoses are likely related to, linked to the patient's diabetes and document findings in next progress note and continue into discharge summary if applicable.  Eg. Diabetic foot ulcer likely secondary to DM2 with neuropathy                    DM2 with PVD         DM2 with Gangrene         DM2 with Cellulitis         DM2 with Other Condition  Thank You, Zoila Shutter ,RN Clinical Documentation Specialist:  [facility] [employer] [facility]

## 2014-07-27 NOTE — Clinical Social Work Psychosocial (Signed)
Clinical Social Work Department BRIEF PSYCHOSOCIAL ASSESSMENT 07/27/2014  Patient:  [name], [name]     Account Number:  [id number]     Admit date:  07/24/2014  Clinical Social Worker:  Dian Queen  Date/Time:  07/26/2014 11:54 AM  Referred by:  Physician  Date Referred:  07/26/2014 Referred for  SNF Placement   Other Referral:   Interview type:  Family Other interview type:    PSYCHOSOCIAL DATA Living Status:  ALONE Admitted from facility:   Level of care:   Primary support name:  Lucus Lambertson Primary support relationship to patient:  FAMILY Degree of support available:   Patient lives by himself in his own home, but does not have family that can provide support for him 24 hours. Patient's family all work they would like him to go to a SNF for short term rehab    CURRENT CONCERNS Current Concerns  Post-Acute Placement   Other Concerns:    SOCIAL WORK ASSESSMENT / PLAN Patien is a 81 year old male who lives alone.  Patient is alert x1, did not know where he was or the year.  Patient's daughter in law Inez Catalina is the health care POA, and his two sons are the finanacial POA.  Patient's daughter expressed how worried she was that patient can not go back home alone due to him falling and also has an ulcer on his foot and toe.  Patient's daughter in law expressed how patient does not have anyone who can stay with him because his one of his sons still work and the other is retired but is still busy.  Inez Catalina expressed that she has her own health needs and she can not stay with patient to help take care of him. Patient's wife is in a memory care unit at [facility] and patient is having some signs of dementia as well according to daughter in law.  Patient's family would like him to go to SNF for short term rehab.  Patient does not want to go to SNF for rehab, but if he has his surgery he will need to have rehab.  Patient may have to go to short term rehab once he is medically ready and  discharge orders have been received.   Assessment/plan status:  Psychosocial Support/Ongoing Assessment of Needs Other assessment/ plan:   Information/referral to community resources:    PATIENT'S/FAMILY'S RESPONSE TO PLAN OF CARE: Patient's family in agreement to going to SNF for short term rehab, and patient is if it is necessary for him to get his strength up.    Jones Broom. [place], MSW, [place] 07/27/2014 12:13 PM

## 2014-07-27 NOTE — Progress Notes (Signed)
Inpatient Diabetes Program Recommendations  AACE/ADA: New Consensus Statement on Inpatient Glycemic Control (2013)  Target Ranges:  Prepandial:   less than 140 mg/dL      Peak postprandial:   less than 180 mg/dL (1-2 hours)      Critically ill patients:  140 - 180 mg/dL     Diabetes history: DM2 Outpatient Diabetes medications: Precose 100 mg tid with meals, Metformin 500 mg daily, Glucotrol 20 mg bid Current orders for Inpatient glycemic control: Novolog 0-9 units TID with meals, Novolog 0-5 units QHS, Precose 100 mg TID with meals  Inpatient Diabetes Program Recommendations Insulin - Basal: Please consider ordering low dose basal insulin. Recommend starting with at least Levemir 8 units Q24H starting now.  Thanks, Barnie Alderman, RN, MSN, CCRN, CDE Diabetes Coordinator Inpatient Diabetes Program [phone] (Team Pager) [phone] ([facility] office) [phone] [facility] office)

## 2014-07-27 NOTE — Progress Notes (Signed)
OT Cancellation Note  Patient Details Name: [name] MRN: [id number] DOB: [birth year]   Cancelled Treatment:    Reason Eval/Treat Not Completed: Fatigue/lethargy limiting ability to participate - pt just back to bed.  Will reattempt.   Darlina Rumpf [place], OTR/L [phone] 07/27/2014, 12:39 PM

## 2014-07-27 NOTE — Progress Notes (Signed)
TELEMETRY: Reviewed telemetry pt in NSR: Filed Vitals:   07/26/14 2003 07/26/14 2115 07/27/14 0500 07/27/14 0608  BP:  126/51  137/57  Pulse: 62 74  75  Temp:  98.6 F (37 C)  97.8 F (36.6 C)  TempSrc:  Oral  Oral  Resp: 16 18  16   Height:   5\' 8"  (1.727 m)   Weight:   132 lb 11.5 oz (60.2 kg)   SpO2: 99% 100%  100%    Intake/Output Summary (Last 24 hours) at 07/27/14 0938 Last data filed at 07/27/14 0809  Gross per 24 hour  Intake    690 ml  Output   1500 ml  Net   -810 ml   Filed Weights   07/24/14 1527 07/26/14 0556 07/27/14 0500  Weight: 145 lb (65.772 kg) 132 lb 0.9 oz (59.9 kg) 132 lb 11.5 oz (60.2 kg)    Subjective Denies chest pain or SOB. Still has pain in right foot.  Marland Kitchen acarbose  100 mg Oral TID WC  . amiodarone  200 mg Oral Daily  . apixaban  2.5 mg Oral BID  . aspirin EC  81 mg Oral Once per day on Mon Thu  . atorvastatin  10 mg Oral Daily  . carvedilol  6.25 mg Oral BID WC  . ferrous sulfate  325 mg Oral Q breakfast  . finasteride  5 mg Oral Daily  . fluticasone  2 spray Each Nare Daily  . furosemide  20 mg Oral Daily  . insulin aspart  0-5 Units Subcutaneous QHS  . insulin aspart  0-9 Units Subcutaneous TID WC  . levothyroxine  100 mcg Oral QAC breakfast  . lisinopril  5 mg Oral Daily  . piperacillin-tazobactam (ZOSYN)  IV  3.375 g Intravenous 3 times per day  . potassium chloride SA  20 mEq Oral BID  . sodium chloride  3 mL Intravenous Q12H  . sodium chloride  3 mL Intravenous Q12H  . vancomycin  750 mg Intravenous Q24H      LABS: Basic Metabolic Panel:  Recent Labs  07/26/14 0540 07/27/14 0451  NA 135 133*  K 4.4 4.0  CL 98 98  CO2 32 27  GLUCOSE 203* 278*  BUN 26* 29*  CREATININE 1.22 1.37*  CALCIUM 8.4 7.9*   Liver Function Tests:  Recent Labs  07/24/14 1552 07/25/14 0440 07/27/14 0451  AST 15 13  --   ALT 19 16  --   ALKPHOS 95 78  --   BILITOT 0.5 0.3  --   PROT 6.8 5.7*  --   ALBUMIN 3.2* 2.6* 2.6*   No  results for input(s): LIPASE, AMYLASE in the last 72 hours. CBC:  Recent Labs  07/24/14 1552 07/25/14 0440 07/26/14 0540 07/27/14 0451  WBC 18.4* 16.5* 12.7* 12.6*  NEUTROABS 17.0* 14.6*  --   --   HGB 12.3* 10.5* 11.5* 11.0*  HCT 36.4* 31.7* 34.9* 32.8*  MCV 92.9 93.2 94.3 91.9  PLT 408* 362 369 345   Cardiac Enzymes: No results for input(s): CKTOTAL, CKMB, CKMBINDEX, TROPONINI in the last 72 hours. BNP: No results for input(s): PROBNP in the last 72 hours. D-Dimer: No results for input(s): DDIMER in the last 72 hours. Hemoglobin A1C:  Recent Labs  07/24/14 2300  HGBA1C 7.7*   Fasting Lipid Panel: No results for input(s): CHOL, HDL, LDLCALC, TRIG, CHOLHDL, LDLDIRECT in the last 72 hours. Thyroid Function Tests:  Recent Labs  07/25/14 1330  TSH 3.181  Radiology/Studies:  No results found.  PHYSICAL EXAM General: Well developed, thin, in no acute distress. Head: Normocephalic, atraumatic, sclera non-icteric, oropharynx is clear Neck: Negative for carotid bruits. JVD not elevated. No adenopathy. Trach in place Lungs: Clear bilaterally to auscultation without wheezes, rales, or rhonchi. Breathing is unlabored. Heart: RRR S1 S2 without murmurs, rubs, or gallops.  Abdomen: Soft, non-tender, non-distended with normoactive bowel sounds. No hepatomegaly. No rebound/guarding. No obvious abdominal masses. Msk:  Strength and tone appears normal for age. Extremities: Gangrenous right foot with edema and erythema. Neuro: Alert and oriented X 3. Moves all extremities spontaneously. Psych:  Responds to questions appropriately with a normal affect.  ASSESSMENT AND PLAN: 1. Gangrene right foot. Not a candidate for revascularization. Patient still has not decided about amputation.  2. PAD 3. Mild to moderate aortic stenosis 4. Atrial fibrillation. Maintaining NSR on amiodarone. 5. Chronic systolic CHF. Well compensated. 6. HTN controlled.  Patient is at moderate risk  for amputation from a cardiac standpoint but is currently stable from our standpoint and further testing or delay will offer no benefit. We will sign off for now. Please call with cardiac questions/problems.  Present on Admission:  . Cellulitis . Diabetic ulcer of right foot . History of throat cancer . Peripheral vascular disease . Hypomagnesemia  Signed, Juanluis Guastella [place], [place] 07/27/2014 9:38 AM

## 2014-07-28 DIAGNOSIS — E1140 Type 2 diabetes mellitus with diabetic neuropathy, unspecified: Secondary | ICD-10-CM

## 2014-07-28 LAB — BASIC METABOLIC PANEL
ANION GAP: 12 (ref 5–15)
BUN: 26 mg/dL — AB (ref 6–23)
CHLORIDE: 103 mmol/L (ref 96–112)
CO2: 21 mmol/L (ref 19–32)
CREATININE: 1.16 mg/dL (ref 0.50–1.35)
Calcium: 8.2 mg/dL — ABNORMAL LOW (ref 8.4–10.5)
GFR calc Af Amer: 62 mL/min — ABNORMAL LOW (ref >90)
GFR calc non Af Amer: 54 mL/min — ABNORMAL LOW (ref >90)
Glucose, Bld: 63 mg/dL — ABNORMAL LOW (ref 70–99)
Potassium: 3.7 mmol/L (ref 3.5–5.1)
Sodium: 136 mmol/L (ref 135–145)

## 2014-07-28 LAB — GLUCOSE, CAPILLARY
Glucose-Capillary: 231 mg/dL — ABNORMAL HIGH (ref 70–99)
Glucose-Capillary: 234 mg/dL — ABNORMAL HIGH (ref 70–99)
Glucose-Capillary: 159 mg/dL — ABNORMAL HIGH (ref 70–99)
Glucose-Capillary: 258 mg/dL — ABNORMAL HIGH (ref 70–99)
Glucose-Capillary: 289 mg/dL — ABNORMAL HIGH (ref 70–99)

## 2014-07-28 LAB — CBC
HCT: 36.7 % — ABNORMAL LOW (ref 39.0–52.0)
Hemoglobin: 11.2 g/dL — ABNORMAL LOW (ref 13.0–17.0)
MCH: 28 pg (ref 26.0–34.0)
MCHC: 30.5 g/dL (ref 30.0–36.0)
MCV: 91.8 fL (ref 78.0–100.0)
PLATELETS: 405 10*3/uL — AB (ref 150–400)
RBC: 4 MIL/uL — ABNORMAL LOW (ref 4.22–5.81)
RDW: 13.8 % (ref 11.5–15.5)
WBC: 15.6 10*3/uL — AB (ref 4.0–10.5)

## 2014-07-28 LAB — APTT: aPTT: 37 seconds (ref 24–37)

## 2014-07-28 LAB — HEPARIN LEVEL (UNFRACTIONATED): Heparin Unfractionated: 0.8 IU/mL — ABNORMAL HIGH (ref 0.30–0.70)

## 2014-07-28 MED ORDER — HEPARIN (PORCINE) IN NACL 100-0.45 UNIT/ML-% IJ SOLN
1250.0000 [IU]/h | INTRAMUSCULAR | Status: DC
  Administered 2014-07-28: 850 [IU]/h via INTRAVENOUS
  Administered 2014-07-29: 1100 [IU]/h via INTRAVENOUS
  Administered 2014-07-30: 1250 [IU]/h via INTRAVENOUS
  Filled 2014-07-28 (×3): qty 250

## 2014-07-28 MED ORDER — VANCOMYCIN HCL IN DEXTROSE 1-5 GM/200ML-% IV SOLN
1000.0000 mg | INTRAVENOUS | Status: DC
  Administered 2014-07-28 – 2014-07-30 (×3): 1000 mg via INTRAVENOUS
  Filled 2014-07-28 (×4): qty 200

## 2014-07-28 MED ORDER — PIPERACILLIN-TAZOBACTAM 3.375 G IVPB
3.3750 g | Freq: Three times a day (TID) | INTRAVENOUS | Status: DC
  Administered 2014-07-28 – 2014-08-01 (×11): 3.375 g via INTRAVENOUS
  Filled 2014-07-28 (×13): qty 50

## 2014-07-28 MED ORDER — HEPARIN BOLUS VIA INFUSION
3000.0000 [IU] | Freq: Once | INTRAVENOUS | Status: AC
  Administered 2014-07-28: 3000 [IU] via INTRAVENOUS
  Filled 2014-07-28: qty 3000

## 2014-07-28 NOTE — Progress Notes (Signed)
[facility] PROGRESS NOTE  [name] NLG:[id number] DOB: 1923/05/17 DOA: 07/24/2014 PCP: Mathews Argyle, MD  Assessment/Plan: 1-Right foot cellulitis with gangrenous ulcer of the plantar aspect of first metatarsal head:  - Foot ulcer secondary to Diabetes with PVD and gangrene.  - Ortho consulted. Dr Sharol Given recommend cardiology consultation for arteriogram. If patient is not candidate for revascularization patient might require a transtibial amputation.  - No need for repeating arteriogram per Dr fields.  He discussed proceeding with Above Knee amputation  - Continue with IV antibiotics. Day 3. Changed to Ancef, culture growing MSSA, however, WBC and temperature are trending up.  May be multiple bacteria causing infection.   -  Stop ancef and resume vanc and zosyn -  Start florastor -  Family leaning towards amputation  2-A fib; continue with amiodarone, carvedilol.  Hold eliquis in anticipation to surgery.   -  Start heparin gtt   3-Diabetes: hold glipizide.  -  Continue with SSI.  -  Hypoglycemic this am -  Ordered hypoglycemia protocol and d/c'd levemir.   4-HLD; continue with Lipitor.   5-Hypothyroidism; continue with synthroid.   6-Systolic HF, Ef 25 to 30 %; by ECHO 2015. Appears compensated.  Continue with lasix. Hold lisinopril due to raising Cr.   7-Throat Cancer, S/P trach; Patient need to have TEP exchange. I spoke with Dr Erik Obey, ([phone] phone), Candice from speech therapy at [facility]:  [phone].  He does have a small central leak. -  Supplies cannot be given by [facility] to be delivered here or to family -  Recommend thickened diet -  Will increase to honey thick if needed -  Can temporize with catheter if needed and will notify Dr. Erik Obey prn. -  Not urgent and can wait until decision is made regarding amputation  Code Status: Full Code.  Family Communication: Care discussed with son Morris Disposition Plan: likely SNF   Consultants:  Ortho,  Dr Sharol Given.   Procedures:  ABI; ordered.   Antibiotics:  Vancomycin 2-6  Zosyn 2-6  HPI/Subjective: No complaints. No dyspnea.  Foot is mildly bothersome  Objective: Filed Vitals:   07/28/14 1345  BP: 96/45  Pulse: 72  Temp: 98.4 F (36.9 C)  Resp: 18    Intake/Output Summary (Last 24 hours) at 07/28/14 1658 Last data filed at 07/28/14 1452  Gross per 24 hour  Intake    580 ml  Output    700 ml  Net   -120 ml   Filed Weights   07/26/14 0556 07/27/14 0500 07/28/14 0500  Weight: 59.9 kg (132 lb 0.9 oz) 60.2 kg (132 lb 11.5 oz) 60.2 kg (132 lb 11.5 oz)    Exam:   General:  Alert, cachetic, in no distress. trach  Cardiovascular: S 1, S 2 RRR  Respiratory: CTAB  Abdomen: BS, present, nt  Musculoskeletal: no edema.   Extremity: right gangrenous ulcer plantar aspect of first metatarsal.  Erythema to the mid-foot.    Data Reviewed: Basic Metabolic Panel:  Recent Labs Lab 07/25/14 0440 07/26/14 0540 07/27/14 0451 07/27/14 1606 07/28/14 0556  NA 137 135 133* 135 136  K 4.1 4.4 4.0 4.2 3.7  CL 99 98 98 97 103  CO2 31 32 27 27 21   GLUCOSE 172* 203* 278* 150* 63*  BUN 35* 26* 29* 28* 26*  CREATININE 1.25 1.22 1.37* 1.26 1.16  CALCIUM 8.2* 8.4 7.9* 8.2* 8.2*   Liver Function Tests:  Recent Labs Lab 07/24/14 1552 07/25/14 0440 07/27/14 0451  AST 15  13  --   ALT 19 16  --   ALKPHOS 95 78  --   BILITOT 0.5 0.3  --   PROT 6.8 5.7*  --   ALBUMIN 3.2* 2.6* 2.6*   No results for input(s): LIPASE, AMYLASE in the last 168 hours. No results for input(s): AMMONIA in the last 168 hours. CBC:  Recent Labs Lab 07/24/14 1552 07/25/14 0440 07/26/14 0540 07/27/14 0451 07/28/14 0556  WBC 18.4* 16.5* 12.7* 12.6* 15.6*  NEUTROABS 17.0* 14.6*  --   --   --   HGB 12.3* 10.5* 11.5* 11.0* 11.2*  HCT 36.4* 31.7* 34.9* 32.8* 36.7*  MCV 92.9 93.2 94.3 91.9 91.8  PLT 408* 362 369 345 405*   Cardiac Enzymes: No results for input(s): CKTOTAL, CKMB, CKMBINDEX,  TROPONINI in the last 168 hours. BNP (last 3 results)  Recent Labs  07/12/14 2121  BNP 315.7*    ProBNP (last 3 results)  Recent Labs  02/12/14 0728 03/17/14 1030 03/18/14 0320  PROBNP 9440.0* 5030.0* 7135.0*    CBG:  Recent Labs Lab 07/27/14 1204 07/27/14 1731 07/27/14 2143 07/28/14 0737 07/28/14 1154  GLUCAP 315* 204* 118* 234* 159*    Recent Results (from the past 240 hour(s))  Culture, blood (routine x 2)     Status: None (Preliminary result)   Collection Time: 07/24/14  6:30 PM  Result Value Ref Range Status   Specimen Description BLOOD RIGHT ARM  Final   Special Requests   Final    BOTTLES DRAWN AEROBIC AND ANAEROBIC 4CC BLUE [place] RED   Culture   Final           BLOOD CULTURE RECEIVED NO GROWTH TO DATE CULTURE WILL BE HELD FOR 5 DAYS BEFORE ISSUING A FINAL NEGATIVE REPORT Note: Culture results may be compromised due to an excessive volume of blood received in culture bottles. Performed at [facility]    Report Status PENDING  Incomplete  Culture, blood (routine x 2)     Status: None (Preliminary result)   Collection Time: 07/24/14  6:34 PM  Result Value Ref Range Status   Specimen Description BLOOD LEFT ARM  Final   Special Requests BOTTLES DRAWN AEROBIC AND ANAEROBIC 10CC  Final   Culture   Final           BLOOD CULTURE RECEIVED NO GROWTH TO DATE CULTURE WILL BE HELD FOR 5 DAYS BEFORE ISSUING A FINAL NEGATIVE REPORT Note: Culture results may be compromised due to an inadequate volume of blood received in culture bottles. Performed at [facility]    Report Status PENDING  Incomplete  Wound culture     Status: None   Collection Time: 07/24/14  6:40 PM  Result Value Ref Range Status   Specimen Description WOUND RIGHT FOOT  Final   Special Requests NONE  Final   Gram Stain   Final    NO WBC SEEN NO SQUAMOUS EPITHELIAL CELLS SEEN FEW GRAM POSITIVE COCCI IN PAIRS Performed at [facility]    Culture   Final    ABUNDANT  STAPHYLOCOCCUS AUREUS Note: RIFAMPIN AND GENTAMICIN SHOULD NOT BE USED AS SINGLE DRUGS FOR TREATMENT OF STAPH INFECTIONS. Performed at [facility]    Report Status 07/27/2014 FINAL  Final   Organism ID, Bacteria STAPHYLOCOCCUS AUREUS  Final      Susceptibility   Staphylococcus aureus - MIC*    CLINDAMYCIN <=0.25 SENSITIVE Sensitive     ERYTHROMYCIN <=0.25 SENSITIVE Sensitive  GENTAMICIN <=0.5 SENSITIVE Sensitive     LEVOFLOXACIN 0.25 SENSITIVE Sensitive     OXACILLIN 0.5 SENSITIVE Sensitive     PENICILLIN >=0.5 RESISTANT Resistant     RIFAMPIN <=0.5 SENSITIVE Sensitive     TRIMETH/SULFA <=10 SENSITIVE Sensitive     VANCOMYCIN 1 SENSITIVE Sensitive     TETRACYCLINE >=16 RESISTANT Resistant     MOXIFLOXACIN <=0.25 SENSITIVE Sensitive     * ABUNDANT STAPHYLOCOCCUS AUREUS     Studies: No results found.  Scheduled Meds: . amiodarone  200 mg Oral Daily  . aspirin EC  81 mg Oral Once per day on Mon Thu  . atorvastatin  10 mg Oral Daily  . carvedilol  6.25 mg Oral BID WC  . ferrous sulfate  325 mg Oral Q breakfast  . finasteride  5 mg Oral Daily  . fluticasone  2 spray Each Nare Daily  . furosemide  20 mg Oral Daily  . insulin aspart  0-9 Units Subcutaneous TID WC  . levothyroxine  100 mcg Oral QAC breakfast  . piperacillin-tazobactam (ZOSYN)  IV  3.375 g Intravenous Q8H  . sodium chloride  3 mL Intravenous Q12H  . sodium chloride  3 mL Intravenous Q12H  . vancomycin  1,000 mg Intravenous Q24H   Continuous Infusions:   Active Problems:   Peripheral vascular disease   DM (diabetes mellitus)   Cardiomyopathy- EF 25-30%   History of throat cancer   Cellulitis   Hypomagnesemia   Anemia   Diabetic ulcer of right foot    Time spent: 35 minutes.     Janece Canterbury  [facility] Pager [phone]. If 7PM-7AM, please contact night-coverage at [email or web address], password [facility] 07/28/2014, 4:58 PM  LOS: 4 days

## 2014-07-28 NOTE — Progress Notes (Signed)
ANTIBIOTIC CONSULT NOTE - INITIAL  Pharmacy Consult for Vancomycin and Zosyn Indication: wound infection  No Known Allergies  Patient Measurements: Height: 5\' 8"  (172.7 cm) Weight: 132 lb 11.5 oz (60.2 kg) IBW/kg (Calculated) : 68.4 Adjusted Body Weight: 65.8 kg  Vital Signs: Temp: 98.4 F (36.9 C) (02/10 1345) Temp Source: Oral (02/10 1345) BP: 96/45 mmHg (02/10 1345) Pulse Rate: 72 (02/10 1345) Intake/Output from previous day: 02/09 0701 - 02/10 0700 In: 100 [IV Piggyback:100] Out: 600 [Urine:600] Intake/Output from this shift: Total I/O In: 480 [P.O.:480] Out: 400 [Urine:400]  Labs:  Recent Labs  07/26/14 0540 07/27/14 0451 07/27/14 1606 07/28/14 0556  WBC 12.7* 12.6*  --  15.6*  HGB 11.5* 11.0*  --  11.2*  PLT 369 345  --  405*  CREATININE 1.22 1.37* 1.26 1.16   Estimated Creatinine Clearance: 36.8 mL/min (by C-G formula based on Cr of 1.16). No results for input(s): VANCOTROUGH, VANCOPEAK, VANCORANDOM, GENTTROUGH, GENTPEAK, GENTRANDOM, TOBRATROUGH, TOBRAPEAK, TOBRARND, AMIKACINPEAK, AMIKACINTROU, AMIKACIN in the last 72 hours.   Microbiology: Recent Results (from the past 720 hour(s))  Urine culture     Status: None   Collection Time: 07/15/14 12:21 AM  Result Value Ref Range Status   Specimen Description URINE, CLEAN CATCH  Final   Special Requests NONE  Final   Colony Count NO GROWTH Performed at [facility]   Final   Culture NO GROWTH Performed at [facility]   Final   Report Status 07/16/2014 FINAL  Final  Culture, blood (routine x 2)     Status: None (Preliminary result)   Collection Time: 07/24/14  6:30 PM  Result Value Ref Range Status   Specimen Description BLOOD RIGHT ARM  Final   Special Requests   Final    BOTTLES DRAWN AEROBIC AND ANAEROBIC 4CC BLUE [place] RED   Culture   Final           BLOOD CULTURE RECEIVED NO GROWTH TO DATE CULTURE WILL BE HELD FOR 5 DAYS BEFORE ISSUING A FINAL NEGATIVE REPORT Note: Culture results  may be compromised due to an excessive volume of blood received in culture bottles. Performed at [facility]    Report Status PENDING  Incomplete  Culture, blood (routine x 2)     Status: None (Preliminary result)   Collection Time: 07/24/14  6:34 PM  Result Value Ref Range Status   Specimen Description BLOOD LEFT ARM  Final   Special Requests BOTTLES DRAWN AEROBIC AND ANAEROBIC 10CC  Final   Culture   Final           BLOOD CULTURE RECEIVED NO GROWTH TO DATE CULTURE WILL BE HELD FOR 5 DAYS BEFORE ISSUING A FINAL NEGATIVE REPORT Note: Culture results may be compromised due to an inadequate volume of blood received in culture bottles. Performed at [facility]    Report Status PENDING  Incomplete  Wound culture     Status: None   Collection Time: 07/24/14  6:40 PM  Result Value Ref Range Status   Specimen Description WOUND RIGHT FOOT  Final   Special Requests NONE  Final   Gram Stain   Final    NO WBC SEEN NO SQUAMOUS EPITHELIAL CELLS SEEN FEW GRAM POSITIVE COCCI IN PAIRS Performed at [facility]    Culture   Final    ABUNDANT STAPHYLOCOCCUS AUREUS Note: RIFAMPIN AND GENTAMICIN SHOULD NOT BE USED AS SINGLE DRUGS FOR TREATMENT OF STAPH INFECTIONS. Performed at [facility]  Report Status 07/27/2014 FINAL  Final   Organism ID, Bacteria STAPHYLOCOCCUS AUREUS  Final      Susceptibility   Staphylococcus aureus - MIC*    CLINDAMYCIN <=0.25 SENSITIVE Sensitive     ERYTHROMYCIN <=0.25 SENSITIVE Sensitive     GENTAMICIN <=0.5 SENSITIVE Sensitive     LEVOFLOXACIN 0.25 SENSITIVE Sensitive     OXACILLIN 0.5 SENSITIVE Sensitive     PENICILLIN >=0.5 RESISTANT Resistant     RIFAMPIN <=0.5 SENSITIVE Sensitive     TRIMETH/SULFA <=10 SENSITIVE Sensitive     VANCOMYCIN 1 SENSITIVE Sensitive     TETRACYCLINE >=16 RESISTANT Resistant     MOXIFLOXACIN <=0.25 SENSITIVE Sensitive     * ABUNDANT STAPHYLOCOCCUS AUREUS    Medical History: Past Medical History   Diagnosis Date  . Coronary artery disease     s/p CABG 1996 then PCI of SVG to OM 2007  . Diverticulosis   . Dyslipidemia   . Renal artery stenosis     s/p bilateral stents  . Hiatal hernia   . Colon polyps   . Carotid artery stenosis     50% BILATERAL 2014  . Peripheral neuropathy   . Anemia   . Hypothyroidism   . Nasal polyps   . Hypertension   . Diabetes mellitus   . Throat cancer S/P TRACHECTOMY   . Myocardial infarction   . Peripheral vascular disease     LOWER EXT  . Atrial fibrillation/flutter Aug 2015  . GERD (gastroesophageal reflux disease)   . Shortness of breath     Hx: of  . HOH (hard of hearing) VERY   . Left bundle branch block (LBBB) on electrocardiogram 03/17/2014  . Ischemic cardiomyopathy Aug 2015    EF 25-30%   . History of throat cancer     s/p trach  . Aortic stenosis Aug 2015    mild to moderate  . Chronic anticoagulation Aug 2015    Eliquis  . CHF (congestive heart failure)     Assessment: [name] admitted with cellulitis of R foot.  Pharmacy asked to begin empiric vancomycin and Zosyn.  Scr  1.25, est CrCl ~ 37 ml/min.   Patient's antibiotic regimen had been narrowed to Ancef. Now, pharmacy is consulted to broaden antibiotics to vancomycin and zosyn for wound infection. Pt is afebrile, WBC trending back up to 15.6, sCr 1.16.  Goal of Therapy:  Vancomycin trough level 10-15 mcg/ml  Plan:   Zosyn 3.375g IV q8h  Vancomycin 1000 mg IV q24h  F/u vancomycin trough level at steady state as needed.  F/u renal function and cultures.  [name]. Diona Foley, PharmD Clinical Pharmacist Pager [phone]  07/28/2014 4:23 PM

## 2014-07-28 NOTE — Progress Notes (Signed)
RN received call from Vascular PA about patient going to surgery in the a.m. Attempted to get consent, patient refused. "I do not want surgery, let me check out and go home to die." Patient is Alert, Oriented to person, place and situation. RN called patient's son Xzander Gilham. Son stated, "We do not want surgery at this time, Dr. Oneida Alar told me yesterday that we have until next Wednesday to make a decision." RN will page physician.

## 2014-07-28 NOTE — Evaluation (Signed)
Occupational Therapy Evaluation Patient Details Name: [name] MRN: [id number] DOB: [birth year] Today's Date: 07/28/2014    History of Present Illness Adm 07/24/14 with Rt foot cellulitis and ?osteomyelitis (pt refused MRI and angiogram). Has not yet agreed to BKA (as recommended by Vascular)  PMHx- throat Ca with trach, PVD, CAD/CABG, afib, CHF with EF 25%, DM, HOH   Clinical Impression   Pt admitted with above. He demonstrates the below listed deficits and will benefit from continued OT to maximize safety and independence with BADLs.  Pt presents to OT with generalized weakness and impaired balance as well as long standing cognitive deficits.  Pt fearful of standing or mobilizing today so would only agree to EOB activity.  He states "I'[name] about done"; "I'[name] almost in the ground"; "my foot is about done" to therapist today.  Agree with SNF at discharge.  Goals may change pending pt decision re: amputation.  Will follow.       Follow Up Recommendations  SNF    Equipment Recommendations  None recommended by OT    Recommendations for Other Services       Precautions / Restrictions Precautions Precautions: Fall      Mobility Bed Mobility Overal bed mobility: Modified Independent                Transfers                 General transfer comment: Pt refused     Balance                                            ADL Overall ADL's : Needs assistance/impaired Eating/Feeding: Independent;Sitting   Grooming: Wash/dry hands;Wash/dry face;Oral care;Brushing hair;Set up;Sitting   Upper Body Bathing: Set up;Sitting   Lower Body Bathing: Minimal assistance;Bed level;Sit to/from stand   Upper Body Dressing : Set up;Sitting   Lower Body Dressing: Minimal assistance;Sit to/from stand;Bed level   Toilet Transfer: Minimal assistance;Stand-pivot;BSC;RW   Toileting- Clothing Manipulation and Hygiene: Minimal assistance;Sit to/from stand;Bed level          General ADL Comments: Pt fearful of ambulating or transfering on Rt foot, and refused OOB activities.  He did complete grooming including shaving EOB     Vision     Perception     Praxis      Pertinent Vitals/Pain Pain Assessment: Faces Faces Pain Scale: Hurts a little bit Pain Location: rt foot Pain Descriptors / Indicators: Sore Pain Intervention(s): Limited activity within patient's tolerance     Hand Dominance Right   Extremity/Trunk Assessment Upper Extremity Assessment Upper Extremity Assessment: Generalized weakness   Lower Extremity Assessment Lower Extremity Assessment: Defer to PT evaluation   Cervical / Trunk Assessment Cervical / Trunk Assessment: Kyphotic   Communication Communication Communication: HOH;Tracheostomy   Cognition Arousal/Alertness: Awake/alert Behavior During Therapy: WFL for tasks assessed/performed Overall Cognitive Status: History of cognitive impairments - at baseline       Memory: Decreased short-term memory             General Comments       Exercises       Shoulder Instructions      Home Living Family/patient expects to be discharged to:: Skilled nursing facility  Prior Functioning/Environment Level of Independence: Independent        Comments: driving until 2 weeks ago; states he uses cane "sometimes"    OT Diagnosis: Generalized weakness;Cognitive deficits;Acute pain   OT Problem List: Decreased strength;Decreased activity tolerance;Impaired balance (sitting and/or standing);Decreased safety awareness;Decreased knowledge of use of DME or AE;Pain   OT Treatment/Interventions:      OT Goals(Current goals can be found in the care plan section) Acute Rehab OT Goals Patient Stated Goal: Pt did not state OT Goal Formulation: With patient Time For Goal Achievement: 08/11/14 Potential to Achieve Goals: Fair ADL Goals Pt Will Perform Grooming:  with min guard assist;standing Pt Will Perform Lower Body Bathing: with min guard assist;sit to/from stand Pt Will Perform Lower Body Dressing: with min guard assist;sit to/from stand Pt Will Transfer to Toilet: with min guard assist;ambulating;stand pivot transfer;regular height toilet;bedside commode;grab bars Pt Will Perform Toileting - Clothing Manipulation and hygiene: with min guard assist;sit to/from stand  OT Frequency:     Barriers to D/C: Decreased caregiver support          Co-evaluation              End of Session Equipment Utilized During Treatment: Surveyor, mining Communication: Mobility status  Activity Tolerance: Other (comment) (fear of standing due to Rt foot wound) Patient left: in bed;with bed alarm set   Time: 5697-9480 OT Time Calculation (min): 30 min Charges:  OT General Charges $OT Visit: 1 Procedure OT Evaluation $Initial OT Evaluation Tier I: 1 Procedure OT Treatments $Self Care/Home Management : 8-22 mins G-Codes:    [name] 08/27/2014, 12:21 PM

## 2014-07-28 NOTE — Progress Notes (Signed)
ANTICOAGULATION CONSULT NOTE - Initial Consult  Pharmacy Consult for Heparin Indication: atrial fibrillation  No Known Allergies  Patient Measurements: Height: 5\' 8"  (172.7 cm) Weight: 132 lb 11.5 oz (60.2 kg) IBW/kg (Calculated) : 68.4 Heparin Dosing Weight: 60 kg  Vital Signs: Temp: 98.4 F (36.9 C) (02/10 1345) Temp Source: Oral (02/10 1345) BP: 96/45 mmHg (02/10 1345) Pulse Rate: 72 (02/10 1345)  Labs:  Recent Labs  07/26/14 0540 07/27/14 0451 07/27/14 1606 07/28/14 0556  HGB 11.5* 11.0*  --  11.2*  HCT 34.9* 32.8*  --  36.7*  PLT 369 345  --  405*  CREATININE 1.22 1.37* 1.26 1.16    Estimated Creatinine Clearance: 36.8 mL/min (by C-G formula based on Cr of 1.16).   Medical History: Past Medical History  Diagnosis Date  . Coronary artery disease     s/p CABG 1996 then PCI of SVG to OM 2007  . Diverticulosis   . Dyslipidemia   . Renal artery stenosis     s/p bilateral stents  . Hiatal hernia   . Colon polyps   . Carotid artery stenosis     50% BILATERAL 2014  . Peripheral neuropathy   . Anemia   . Hypothyroidism   . Nasal polyps   . Hypertension   . Diabetes mellitus   . Throat cancer S/P TRACHECTOMY   . Myocardial infarction   . Peripheral vascular disease     LOWER EXT  . Atrial fibrillation/flutter Aug 2015  . GERD (gastroesophageal reflux disease)   . Shortness of breath     Hx: of  . HOH (hard of hearing) VERY   . Left bundle branch block (LBBB) on electrocardiogram 03/17/2014  . Ischemic cardiomyopathy Aug 2015    EF 25-30%   . History of throat cancer     s/p trach  . Aortic stenosis Aug 2015    mild to moderate  . Chronic anticoagulation Aug 2015    Eliquis  . CHF (congestive heart failure)     Medications:  Prescriptions prior to admission  Medication Sig Dispense Refill Last Dose  . acarbose (PRECOSE) 100 MG tablet Take 100 mg by mouth 3 (three) times daily.   unknown  . amiodarone (PACERONE) 400 MG tablet Take 200 mg by  mouth daily.   unknown  . apixaban (ELIQUIS) 2.5 MG TABS tablet Take 2.5 mg by mouth 2 (two) times daily.   unknown  . aspirin EC 81 MG tablet Take 81 mg by mouth 2 (two) times a week. Mondays and thursdays   unknown  . atorvastatin (LIPITOR) 10 MG tablet Take 10 mg by mouth daily.   unknown  . carvedilol (COREG) 6.25 MG tablet Take 6.25 mg by mouth 2 (two) times daily with a meal.   unknown  . ferrous sulfate 325 (65 FE) MG tablet Take 325 mg by mouth daily with breakfast.   unknown  . finasteride (PROSCAR) 5 MG tablet Take 5 mg by mouth daily.    unknown  . fluticasone (FLONASE) 50 MCG/ACT nasal spray Place 2 sprays into both nostrils daily.  11 unknown  . furosemide (LASIX) 20 MG tablet Take 20 mg by mouth daily.   unknown  . glipiZIDE (GLUCOTROL) 10 MG tablet Take 20 mg by mouth 2 (two) times daily before a meal.    unknown  . levothyroxine (SYNTHROID, LEVOTHROID) 100 MCG tablet Take 100 mcg by mouth daily before breakfast.   unknown  . lisinopril (PRINIVIL,ZESTRIL) 5 MG tablet Take 5 mg by  mouth daily.   unknown  . metFORMIN (GLUCOPHAGE) 500 MG tablet Take 500 mg by mouth daily.   unknown  . methylPREDNISolone (MEDROL DOSEPAK) 4 MG tablet follow package directions 21 tablet 0 unknown  . potassium chloride SA (K-DUR,KLOR-CON) 20 MEQ tablet Take 20 mEq by mouth 2 (two) times daily.   unknown  . senna-docusate (SENOKOT-S) 8.6-50 MG per tablet Take 1 tablet by mouth at bedtime as needed for mild constipation. 20 tablet 0 unknown  . traMADol (ULTRAM) 50 MG tablet Take 1 tablet (50 mg total) by mouth every 6 (six) hours as needed. 15 tablet 0 unknown   Scheduled:  . amiodarone  200 mg Oral Daily  . aspirin EC  81 mg Oral Once per day on Mon Thu  . atorvastatin  10 mg Oral Daily  . carvedilol  6.25 mg Oral BID WC  . ferrous sulfate  325 mg Oral Q breakfast  . finasteride  5 mg Oral Daily  . fluticasone  2 spray Each Nare Daily  . furosemide  20 mg Oral Daily  . insulin aspart  0-9 Units  Subcutaneous TID WC  . levothyroxine  100 mcg Oral QAC breakfast  . piperacillin-tazobactam (ZOSYN)  IV  3.375 g Intravenous Q8H  . sodium chloride  3 mL Intravenous Q12H  . sodium chloride  3 mL Intravenous Q12H  . vancomycin  1,000 mg Intravenous Q24H   Infusions:    Assessment: 81 yo male admitted with cellulitis of R foot. Pharmacy is consulted to dose heparin for atrial fibrillation. Pt was on apixaban PTA and last dose was 2/9 at 1015. Will monitor aPTT and HL initially. Hgb 11.2, Plt 405. Baseline aPTT and HL are pending.  Goal of Therapy:  Heparin level 0.3-0.7 units/ml aPTT 66-102 seconds Monitor platelets by anticoagulation protocol: Yes   Plan:  Give 3000 units bolus x 1 Start heparin infusion at 850 units/hr Check anti-Xa level and aPTT in 8 hours and daily while on heparin Continue to monitor H&H and platelets  Monitor s/sx of bleeding  [name]. Diona Foley, PharmD Clinical Pharmacist Pager [phone] 07/28/2014,4:55 PM

## 2014-07-29 ENCOUNTER — Encounter (HOSPITAL_COMMUNITY)
Admission: EM | Disposition: A | Discharge status: Hospice | Payer: Self-pay | Source: Home / Self Care | Attending: Internal Medicine

## 2014-07-29 LAB — CBC
HCT: 32.4 % — ABNORMAL LOW (ref 39.0–52.0)
Hemoglobin: 10.9 g/dL — ABNORMAL LOW (ref 13.0–17.0)
MCH: 31.1 pg (ref 26.0–34.0)
MCHC: 33.6 g/dL (ref 30.0–36.0)
MCV: 92.3 fL (ref 78.0–100.0)
PLATELETS: 384 10*3/uL (ref 150–400)
RBC: 3.51 MIL/uL — AB (ref 4.22–5.81)
RDW: 13.6 % (ref 11.5–15.5)
WBC: 13.3 10*3/uL — AB (ref 4.0–10.5)

## 2014-07-29 LAB — HEPARIN LEVEL (UNFRACTIONATED): Heparin Unfractionated: 0.75 IU/mL — ABNORMAL HIGH (ref 0.30–0.70)

## 2014-07-29 LAB — GLUCOSE, CAPILLARY
GLUCOSE-CAPILLARY: 114 mg/dL — AB (ref 70–99)
Glucose-Capillary: 382 mg/dL — ABNORMAL HIGH (ref 70–99)
Glucose-Capillary: 306 mg/dL — ABNORMAL HIGH (ref 70–99)
Glucose-Capillary: 160 mg/dL — ABNORMAL HIGH (ref 70–99)

## 2014-07-29 LAB — BASIC METABOLIC PANEL
Anion gap: 9 (ref 5–15)
BUN: 28 mg/dL — ABNORMAL HIGH (ref 6–23)
CO2: 25 mmol/L (ref 19–32)
CREATININE: 1.13 mg/dL (ref 0.50–1.35)
Calcium: 7.4 mg/dL — ABNORMAL LOW (ref 8.4–10.5)
Chloride: 97 mmol/L (ref 96–112)
GFR calc Af Amer: 64 mL/min — ABNORMAL LOW (ref >90)
GFR calc non Af Amer: 56 mL/min — ABNORMAL LOW (ref >90)
Glucose, Bld: 429 mg/dL — ABNORMAL HIGH (ref 70–99)
Potassium: 4.1 mmol/L (ref 3.5–5.1)
Sodium: 131 mmol/L — ABNORMAL LOW (ref 135–145)

## 2014-07-29 LAB — APTT
aPTT: 48 seconds — ABNORMAL HIGH (ref 24–37)
aPTT: 58 seconds — ABNORMAL HIGH (ref 24–37)

## 2014-07-29 SURGERY — AMPUTATION, ABOVE KNEE
Anesthesia: General | Laterality: Right

## 2014-07-29 MED ORDER — LORAZEPAM 2 MG/ML IJ SOLN
1.0000 mg | Freq: Once | INTRAMUSCULAR | Status: AC
Start: 2014-07-29 — End: 2014-07-30
  Administered 2014-07-30: 1 mg via INTRAVENOUS
  Filled 2014-07-29: qty 1

## 2014-07-29 MED ORDER — INSULIN ASPART 100 UNIT/ML ~~LOC~~ SOLN
2.0000 [IU] | Freq: Three times a day (TID) | SUBCUTANEOUS | Status: DC
  Administered 2014-07-29 – 2014-07-31 (×7): 2 [IU] via SUBCUTANEOUS

## 2014-07-29 MED ORDER — INSULIN DETEMIR 100 UNIT/ML ~~LOC~~ SOLN
5.0000 [IU] | Freq: Every day | SUBCUTANEOUS | Status: DC
  Administered 2014-07-29 – 2014-07-30 (×2): 5 [IU] via SUBCUTANEOUS
  Filled 2014-07-29 (×2): qty 0.05

## 2014-07-29 NOTE — Progress Notes (Signed)
dtr n law/POA [name] wants Dr Sharol Given to call her concerning pt. She states that Dr Sharol Given had stated to her, to call anytime she had concern or question.  Dr Jess Barters office called and information given to recieptionist, she will give to Dr Sharol Given , [name] [phone].

## 2014-07-29 NOTE — Progress Notes (Signed)
Patient ID: [name], male   DOB: 06-Nov-1922, 81 y.o.   MRN: [id number] Dr. Sheran Fava notified our office yesterday afternoon the patient was ready to proceed with right above-knee dictation and all or in agreement After we attempted obtaining operative consent family members and the patient said he was not ready to proceed with amputation of right leg as recommended by Dr. Juanda Crum[place] fields  Please notify us when patient is ready to proceed with surgery and we will be happy to schedule him at that time

## 2014-07-29 NOTE — Progress Notes (Signed)
ANTICOAGULATION CONSULT NOTE  Pharmacy Consult for Heparin Indication: atrial fibrillation  No Known Allergies  Patient Measurements: Height: 5\' 8"  (172.7 cm) Weight: 137 lb 2 oz (62.2 kg) IBW/kg (Calculated) : 68.4 Heparin Dosing Weight: 60 kg  Vital Signs: Temp: 98.2 F (36.8 C) (02/11 0651) Temp Source: Oral (02/11 0651) BP: 112/60 mmHg (02/11 0651) Pulse Rate: 74 (02/11 0651)  Labs:  Recent Labs  07/27/14 0451 07/27/14 1606 07/28/14 0556 07/28/14 1910 07/29/14 0615  HGB 11.0*  --  11.2*  --  10.9*  HCT 32.8*  --  36.7*  --  32.4*  PLT 345  --  405*  --  384  APTT  --   --   --  37 48*  HEPARINUNFRC  --   --   --  0.80* 0.75*  CREATININE 1.37* 1.26 1.16  --   --     Estimated Creatinine Clearance: 38 mL/min (by C-G formula based on Cr of 1.16).  Assessment: 81 yo male with h/o Afib Eliquis on hold, for heparin  Goal of Therapy:  Heparin level 0.3-0.7 units/ml aPTT 66-102 seconds Monitor platelets by anticoagulation protocol: Yes   Plan:  Increase Heparin 1000 units/hr Check heparin level in 8 hours.  Phillis Knack, PharmD, BCPS 07/29/2014,7:42 AM

## 2014-07-29 NOTE — Progress Notes (Signed)
ANTICOAGULATION CONSULT NOTE - Follow Up Consult  Pharmacy Consult for heparin Indication: atrial fibrillation  No Known Allergies  Patient Measurements: Height: 5\' 8"  (100.7 cm) Weight: 137 lb 2 oz (62.2 kg) IBW/kg (Calculated) : 68.4 Heparin Dosing Weight: 60 kg  Vital Signs: Temp: 98.2 F (36.8 C) (02/11 1500) Temp Source: Oral (02/11 1500) BP: 105/48 mmHg (02/11 1500) Pulse Rate: 116 (02/11 1500)  Labs:  Recent Labs  07/27/14 0451 07/27/14 1606 07/28/14 0556 07/28/14 1910 07/29/14 0615 07/29/14 1600  HGB 11.0*  --  11.2*  --  10.9*  --   HCT 32.8*  --  36.7*  --  32.4*  --   PLT 345  --  405*  --  384  --   APTT  --   --   --  37 48* 58*  HEPARINUNFRC  --   --   --  0.80* 0.75*  --   CREATININE 1.37* 1.26 1.16  --  1.13  --     Estimated Creatinine Clearance: 39 mL/min (by C-G formula based on Cr of 1.13).   Medications:  Scheduled:  . amiodarone  200 mg Oral Daily  . aspirin EC  81 mg Oral Once per day on Mon Thu  . atorvastatin  10 mg Oral Daily  . ferrous sulfate  325 mg Oral Q breakfast  . finasteride  5 mg Oral Daily  . fluticasone  2 spray Each Nare Daily  . furosemide  20 mg Oral Daily  . insulin aspart  0-9 Units Subcutaneous TID WC  . insulin aspart  2 Units Subcutaneous TID WC  . insulin detemir  5 Units Subcutaneous Daily  . levothyroxine  100 mcg Oral QAC breakfast  . piperacillin-tazobactam (ZOSYN)  IV  3.375 g Intravenous Q8H  . sodium chloride  3 mL Intravenous Q12H  . sodium chloride  3 mL Intravenous Q12H  . vancomycin  1,000 mg Intravenous Q24H   Infusions:  . heparin 1,000 Units/hr (07/29/14 0957)    Assessment: 81 yo male with afib is currently on subtherapeutic heparin.  His aPTT level was 58.  Patient was on apixaban; last dose was 02/09 @ 1015  Goal of Therapy:  aPTT 66-102 seconds; heparin level 0.3-0.7 units/ml Monitor platelets by anticoagulation protocol: Yes   Plan:  - increase heparin to 1100 units/hr - 8hr  heparin level and aPTT  Marios Gaiser, Tsz-Yin 07/29/2014,4:49 PM

## 2014-07-29 NOTE — Progress Notes (Signed)
PT Cancellation Note  Patient Details Name: [name] MRN: [id number] DOB: 22-Oct-1922   Cancelled Treatment:    Reason Eval/Treat Not Completed: Patient reports he is afraid and does not want to get up and put pressure on his foot. (RN reports pt with fall in bathroom with nursing staff this morning).    Felix Pratt 07/29/2014, 2:10 PM Pager [phone]

## 2014-07-29 NOTE — Progress Notes (Addendum)
Reported by nursing students on unit, that pt got up out of his chair headed to bathroom, they went inside to help pt. Pt was assisted to toilet, nursing students at doorway of bathroom, pt stood up and started to fall, nursing students able to  assist pt down to floor.  Nursing students report that pt did not hit his head, pt alert, assisted by staff up onto the toilet, then pt ambulated to the chair, then back to bed. VSS 149/55, 86,20, 100% sat RA, 97.3  Pt denies any pain or discomfort. Bed alarm placed on.  Dr Sheran Fava informed.

## 2014-07-29 NOTE — Progress Notes (Addendum)
[facility] PROGRESS NOTE  [name] YKD:[id number] DOB: October 10, 1922 DOA: 07/24/2014 PCP: Mathews Argyle, MD  Brief assessment  81 year old male with history of CAD status post CABG, aortic stenosis, paroxysmal atrial fibrillation, congestive heart failure with ejection fraction of 25-30%, peripheral vascular disease, who presented with a skin ulcer on the right foot the was noticed approximately 4 days prior to admission. He had been seen by his primary care doctor and started on oral antibiotics, however because he was getting progressively worse he was told to come to the emergency department for evaluation. In the emergency department, the physician thought the patient had cellulitis unresponsive to oral antibiotics. An x-ray was negative for osteomyelitis. His white blood cell count was 18.4. He was found to have gangrene of the right foot, vascular surgery was consulted as was orthopedic surgery. Most likely, the patient will need to undergo amputation of the right foot or decide on palliative care. Despite antibiotics, he has had persistent leukocytosis and worsening appearance of his foot.  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 He is followed at [facility] where he gets his prosthetic exchanged every few months. He has been delirious during his hospitalization and has perseverated on needing to get his prosthetic exchanged. I have personally spoken with his speech therapist who exchanges his prosthetic at [facility].  It appears to have a small leak in the middle of the valve but otherwise appears to be intact. We have been investigating ways in which the patient can get his prosthesis exchanged during this hospitalization, however, because the prosthetic cannot be released from the [facility] clinic, nor do we have his prosthesis in stock, nor does Dr. Erik Obey have any in his office, we may have to temporize until he is able to see Candace  in follow-up.  The family seems to think that the speaking valve is what is causing Mister Pavlock to be confused and to not be able to make a decision regarding amputation. I have told them repeatedly that he has delirium secondary to infection from gangrene. He is unlikely to be able to make a coherent decision about the possibility of amputation, and he will need to rely on his family to make this decision for him.  Even if the speaking valve were to be exchanged, I doubt that he would have improvement in his delirium enough to make a decision.  The family is concerned that there has not been an inability to replace the prosthetic speaking valve and that there may be problem with the valve.  I will ask Dr. Erik Obey to please formally assess the valve in consultation tomorrow.  Assessment/Plan:  1-Right foot cellulitis with gangrenous ulcer of the plantar aspect of first metatarsal head:  - Foot ulcer secondary to Diabetes with PVD and gangrene.  - Ortho consulted -  Dr. Oneida Alar consulted and recommended amputation.  I did NOT say that the family had decided on amputation.  I called the clinic on 2/10 to say the family was leaning towards amputation but that they had not decided yet.   - Continue with IV antibiotics. Day 4.  -  continue vanc and zosyn -  Continue florastor -  Still awaiting decision from family regarding amputation.  If he does not have amputation, could recommend suppressive antibiotics as middle-ground or full palliation.    Delirium secondary to limb ischemia -  Continue antibiotics -  Frequent reorientation  2-A fib; continue with amiodarone, carvedilol.  Hold eliquis in  anticipation of surgery.   -  Continue heparin gtt until family has made final decision about amputation  3-Diabetes: hold glipizide.  Labile CBG.  Hypoglycemic yesterday AM so held levemir, now very hyperglycemic.  Acting more similar to a type 1 diabetic -  Continue with SSI.  -  Resume levemir at 5  units -  Add standing aspart with meals  4-HLD; continue with Lipitor.   5-Hypothyroidism; continue with synthroid.   6-Systolic HF, Ef 25 to 30 %; by ECHO 2015. Appears compensated.  - Continue with lasix.  - Hold lisinopril due to low blood pressures  7-Throat Cancer, S/P trach; Patient need to have TEP exchange. I spoke with Dr Erik Obey, ([phone] phone), [name] from speech therapy at [facility]:  [phone].  He does have a small central leak. -  Supplies cannot be given by [facility] to be delivered here or to family -  Recommend thickened diet -  Will increase to honey thick if needed -  Will have Dr. Erik Obey formally consult tomorrow  Fall despite presence of staff.  Inability to follow instructions due to delirium -  PT/OT following -  Fall precautions  Code Status: Full Code.  Family Communication: Care discussed with daughter [name] Disposition Plan: likely SNF   Consultants:  Ortho, Dr Sharol Given.   Vasc, Dr. Oneida Alar  Procedures:  ABI  Antibiotics:  Vancomycin 2-6  Zosyn 2-6  HPI/Subjective:  Patient confused.  States he feels well.  He states there is something wrong with his toe but cannot be more specific.    Objective: Filed Vitals:   07/29/14 0748  BP:   Pulse: 80  Temp:   Resp: 16    Intake/Output Summary (Last 24 hours) at 07/29/14 1117 Last data filed at 07/29/14 1022  Gross per 24 hour  Intake    530 ml  Output    920 ml  Net   -390 ml   Filed Weights   07/27/14 0500 07/28/14 0500 07/29/14 0500  Weight: 60.2 kg (132 lb 11.5 oz) 60.2 kg (132 lb 11.5 oz) 62.2 kg (137 lb 2 oz)    Exam:   General:  Alert, cachetic, in no distress. Trach.  Clear plastic valve stem protruding.  No apparent surrounding leak, but did leak some in the middle.    Cardiovascular: S 1, S 2 RRR  Respiratory: CTAB  Abdomen: BS, present, nt  Musculoskeletal: no edema.   Extremity: right gangrenous ulcer plantar aspect of first metatarsal.  Erythema to  the mid-foot.  Foul odor today and more wet-appearing.  Psych:  Oriented to person, but not place or time.  Despite repeated discussions, he still does not appear to understand the amputation procedure risks and benefits.    Data Reviewed: Basic Metabolic Panel:  Recent Labs Lab 07/26/14 0540 07/27/14 0451 07/27/14 1606 07/28/14 0556 07/29/14 0615  NA 135 133* 135 136 131*  K 4.4 4.0 4.2 3.7 4.1  CL 98 98 97 103 97  CO2 32 27 27 21 25   GLUCOSE 203* 278* 150* 63* 429*  BUN 26* 29* 28* 26* 28*  CREATININE 1.22 1.37* 1.26 1.16 1.13  CALCIUM 8.4 7.9* 8.2* 8.2* 7.4*   Liver Function Tests:  Recent Labs Lab 07/24/14 1552 07/25/14 0440 07/27/14 0451  AST 15 13  --   ALT 19 16  --   ALKPHOS 95 78  --   BILITOT 0.5 0.3  --   PROT 6.8 5.7*  --   ALBUMIN 3.2* 2.6* 2.6*  No results for input(s): LIPASE, AMYLASE in the last 168 hours. No results for input(s): AMMONIA in the last 168 hours. CBC:  Recent Labs Lab 07/24/14 1552 07/25/14 0440 07/26/14 0540 07/27/14 0451 07/28/14 0556 07/29/14 0615  WBC 18.4* 16.5* 12.7* 12.6* 15.6* 13.3*  NEUTROABS 17.0* 14.6*  --   --   --   --   HGB 12.3* 10.5* 11.5* 11.0* 11.2* 10.9*  HCT 36.4* 31.7* 34.9* 32.8* 36.7* 32.4*  MCV 92.9 93.2 94.3 91.9 91.8 92.3  PLT 408* 362 369 345 405* 384   Cardiac Enzymes: No results for input(s): CKTOTAL, CKMB, CKMBINDEX, TROPONINI in the last 168 hours. BNP (last 3 results)  Recent Labs  07/12/14 2121  BNP 315.7*    ProBNP (last 3 results)  Recent Labs  02/12/14 0728 03/17/14 1030 03/18/14 0320  PROBNP 9440.0* 5030.0* 7135.0*    CBG:  Recent Labs Lab 07/28/14 0737 07/28/14 1154 07/28/14 1646 07/28/14 2133 07/29/14 0752  GLUCAP 234* 159* 258* 289* 382*    Recent Results (from the past 240 hour(s))  Culture, blood (routine x 2)     Status: None (Preliminary result)   Collection Time: 07/24/14  6:30 PM  Result Value Ref Range Status   Specimen Description BLOOD RIGHT ARM   Final   Special Requests   Final    BOTTLES DRAWN AEROBIC AND ANAEROBIC 4CC BLUE [place] RED   Culture   Final           BLOOD CULTURE RECEIVED NO GROWTH TO DATE CULTURE WILL BE HELD FOR 5 DAYS BEFORE ISSUING A FINAL NEGATIVE REPORT Note: Culture results may be compromised due to an excessive volume of blood received in culture bottles. Performed at [facility]    Report Status PENDING  Incomplete  Culture, blood (routine x 2)     Status: None (Preliminary result)   Collection Time: 07/24/14  6:34 PM  Result Value Ref Range Status   Specimen Description BLOOD LEFT ARM  Final   Special Requests BOTTLES DRAWN AEROBIC AND ANAEROBIC 10CC  Final   Culture   Final           BLOOD CULTURE RECEIVED NO GROWTH TO DATE CULTURE WILL BE HELD FOR 5 DAYS BEFORE ISSUING A FINAL NEGATIVE REPORT Note: Culture results may be compromised due to an inadequate volume of blood received in culture bottles. Performed at [facility]    Report Status PENDING  Incomplete  Wound culture     Status: None   Collection Time: 07/24/14  6:40 PM  Result Value Ref Range Status   Specimen Description WOUND RIGHT FOOT  Final   Special Requests NONE  Final   Gram Stain   Final    NO WBC SEEN NO SQUAMOUS EPITHELIAL CELLS SEEN FEW GRAM POSITIVE COCCI IN PAIRS Performed at [facility]    Culture   Final    ABUNDANT STAPHYLOCOCCUS AUREUS Note: RIFAMPIN AND GENTAMICIN SHOULD NOT BE USED AS SINGLE DRUGS FOR TREATMENT OF STAPH INFECTIONS. Performed at [facility]    Report Status 07/27/2014 FINAL  Final   Organism ID, Bacteria STAPHYLOCOCCUS AUREUS  Final      Susceptibility   Staphylococcus aureus - MIC*    CLINDAMYCIN <=0.25 SENSITIVE Sensitive     ERYTHROMYCIN <=0.25 SENSITIVE Sensitive     GENTAMICIN <=0.5 SENSITIVE Sensitive     LEVOFLOXACIN 0.25 SENSITIVE Sensitive     OXACILLIN 0.5 SENSITIVE Sensitive     PENICILLIN >=0.5 RESISTANT Resistant  RIFAMPIN <=0.5 SENSITIVE  Sensitive     TRIMETH/SULFA <=10 SENSITIVE Sensitive     VANCOMYCIN 1 SENSITIVE Sensitive     TETRACYCLINE >=16 RESISTANT Resistant     MOXIFLOXACIN <=0.25 SENSITIVE Sensitive     * ABUNDANT STAPHYLOCOCCUS AUREUS     Studies: No results found.  Scheduled Meds: . amiodarone  200 mg Oral Daily  . aspirin EC  81 mg Oral Once per day on Mon Thu  . atorvastatin  10 mg Oral Daily  . ferrous sulfate  325 mg Oral Q breakfast  . finasteride  5 mg Oral Daily  . fluticasone  2 spray Each Nare Daily  . furosemide  20 mg Oral Daily  . insulin aspart  0-9 Units Subcutaneous TID WC  . insulin aspart  2 Units Subcutaneous TID WC  . insulin detemir  5 Units Subcutaneous Daily  . levothyroxine  100 mcg Oral QAC breakfast  . piperacillin-tazobactam (ZOSYN)  IV  3.375 g Intravenous Q8H  . sodium chloride  3 mL Intravenous Q12H  . sodium chloride  3 mL Intravenous Q12H  . vancomycin  1,000 mg Intravenous Q24H   Continuous Infusions: . heparin 1,000 Units/hr (07/29/14 0957)    Active Problems:   Peripheral vascular disease   DM (diabetes mellitus)   Cardiomyopathy- EF 25-30%   History of throat cancer   Cellulitis   Hypomagnesemia   Anemia   Diabetic ulcer of right foot    Time spent: 35 minutes.     Marks Scalera, [place] [phone]. If 7PM-7AM, please contact night-coverage at [email or web address], password [facility] 07/29/2014, 11:17 AM  LOS: 5 days

## 2014-07-30 LAB — CBC
HCT: 30.5 % — ABNORMAL LOW (ref 39.0–52.0)
Hemoglobin: 10.3 g/dL — ABNORMAL LOW (ref 13.0–17.0)
MCH: 30.7 pg (ref 26.0–34.0)
MCHC: 33.8 g/dL (ref 30.0–36.0)
MCV: 91 fL (ref 78.0–100.0)
PLATELETS: 406 10*3/uL — AB (ref 150–400)
RBC: 3.35 MIL/uL — ABNORMAL LOW (ref 4.22–5.81)
RDW: 13.6 % (ref 11.5–15.5)
WBC: 15.2 10*3/uL — ABNORMAL HIGH (ref 4.0–10.5)

## 2014-07-30 LAB — GLUCOSE, CAPILLARY
GLUCOSE-CAPILLARY: 101 mg/dL — AB (ref 70–99)
GLUCOSE-CAPILLARY: 225 mg/dL — AB (ref 70–99)
Glucose-Capillary: 237 mg/dL — ABNORMAL HIGH (ref 70–99)
Glucose-Capillary: 200 mg/dL — ABNORMAL HIGH (ref 70–99)

## 2014-07-30 LAB — APTT
APTT: 68 s — AB (ref 24–37)
APTT: 56 s — AB (ref 24–37)
APTT: 56 s — AB (ref 24–37)

## 2014-07-30 LAB — HEPARIN LEVEL (UNFRACTIONATED)
HEPARIN UNFRACTIONATED: 0.68 [IU]/mL (ref 0.30–0.70)
Heparin Unfractionated: 0.79 IU/mL — ABNORMAL HIGH (ref 0.30–0.70)

## 2014-07-30 MED ORDER — FUROSEMIDE 20 MG PO TABS
20.0000 mg | ORAL_TABLET | Freq: Every day | ORAL | Status: DC
  Administered 2014-07-31: 20 mg via ORAL
  Filled 2014-07-30: qty 1

## 2014-07-30 MED ORDER — HEPARIN (PORCINE) IN NACL 100-0.45 UNIT/ML-% IJ SOLN
1300.0000 [IU]/h | INTRAMUSCULAR | Status: DC
  Administered 2014-07-30: 1400 [IU]/h via INTRAVENOUS
  Administered 2014-07-31: 1300 [IU]/h via INTRAVENOUS
  Filled 2014-07-30 (×2): qty 250

## 2014-07-30 MED ORDER — INSULIN DETEMIR 100 UNIT/ML ~~LOC~~ SOLN
7.0000 [IU] | Freq: Every day | SUBCUTANEOUS | Status: DC
  Administered 2014-07-31: 7 [IU] via SUBCUTANEOUS
  Filled 2014-07-30: qty 0.07

## 2014-07-30 NOTE — Progress Notes (Signed)
Pt. became agitated and was trying to get out of the bed despite falling earlier yesterday after going to the bathroom alone. Pt. stated that he needed to go pick up his truck so he could go home and chop fire wood for the night. The charge nurse, security, daughter and myself attempted to calm the patient numerous times without successes. MD notified and a OTO of 1 mg of ativan was placed and given. Pt. still continued to try and climb out of the bed. MD notified once again. MD placed belt and wrist restraint order. Belt restraint placed on pt and restraint flow sheet initiated. Daughter notified of the incident by phone. Will continue to monitor pt.

## 2014-07-30 NOTE — Progress Notes (Signed)
ANTICOAGULATION CONSULT NOTE - Follow Up Consult  Pharmacy Consult for heparin Indication: atrial fibrillation  No Known Allergies  Patient Measurements: Height: 5\' 8"  (172.7 cm) Weight: 137 lb 2 oz (62.2 kg) IBW/kg (Calculated) : 68.4 Heparin Dosing Weight: 60 kg  Vital Signs: Temp: 97.7 F (36.5 C) (02/11 2122) Temp Source: Oral (02/11 2122) BP: 131/54 mmHg (02/11 2122) Pulse Rate: 80 (02/11 2122)  Labs:  Recent Labs  07/27/14 1606 07/28/14 0556  07/28/14 1910 07/29/14 0615 07/29/14 1600 07/30/14 0148  HGB  --  11.2*  --   --  10.9*  --  10.3*  HCT  --  36.7*  --   --  32.4*  --  30.5*  PLT  --  405*  --   --  384  --  406*  APTT  --   --   < > 37 48* 58* 68*  HEPARINUNFRC  --   --   --  0.80* 0.75*  --  0.79*  CREATININE 1.26 1.16  --   --  1.13  --   --   < > = values in this interval not displayed.  Estimated Creatinine Clearance: 39 mL/min (by C-G formula based on Cr of 1.13).   Medications:  Scheduled:  . amiodarone  200 mg Oral Daily  . aspirin EC  81 mg Oral Once per day on Mon Thu  . atorvastatin  10 mg Oral Daily  . ferrous sulfate  325 mg Oral Q breakfast  . finasteride  5 mg Oral Daily  . fluticasone  2 spray Each Nare Daily  . furosemide  20 mg Oral Daily  . insulin aspart  0-9 Units Subcutaneous TID WC  . insulin aspart  2 Units Subcutaneous TID WC  . insulin detemir  5 Units Subcutaneous Daily  . levothyroxine  100 mcg Oral QAC breakfast  . piperacillin-tazobactam (ZOSYN)  IV  3.375 g Intravenous Q8H  . sodium chloride  3 mL Intravenous Q12H  . sodium chloride  3 mL Intravenous Q12H  . vancomycin  1,000 mg Intravenous Q24H   Infusions:  . heparin 1,100 Units/hr (07/29/14 1948)    Assessment: 81 yo male with afib is currently on therapeutic heparin.  His aPTT level was 68 which is on lower end of goal.  Patient was on apixaban, last dose was 02/09 @ 1015,  HL seems to still be falsely elevated at 0.79. RN reports no s/s of bleeding    Goal of Therapy:  aPTT 66-102 seconds; heparin level 0.3-0.7 units/ml Monitor platelets by anticoagulation protocol: Yes   Plan:  - increase heparin to 1150 units/hr - 8hr heparin level and aPTT  Albertina Parr, PharmD., BCPS Clinical Pharmacist Pager [phone]

## 2014-07-30 NOTE — Progress Notes (Addendum)
ANTICOAGULATION CONSULT NOTE - Follow Up Consult  Pharmacy Consult for heparin Indication: atrial fibrillation  No Known Allergies  Patient Measurements: Height: 5\' 8"  (172.7 cm) Weight: 138 lb 10.7 oz (62.9 kg) IBW/kg (Calculated) : 68.4 Heparin Dosing Weight: 60 kg  Vital Signs: Temp: 98.6 F (37 C) (02/12 1325) Temp Source: Oral (02/12 1325) BP: 127/67 mmHg (02/12 1325) Pulse Rate: 76 (02/12 1325)  Labs:  Recent Labs  07/27/14 1606  07/28/14 0556  07/29/14 0615 07/29/14 1600 07/30/14 0148 07/30/14 1150  HGB  --   < > 11.2*  --  10.9*  --  10.3*  --   HCT  --   --  36.7*  --  32.4*  --  30.5*  --   PLT  --   --  405*  --  384  --  406*  --   APTT  --   --   --   < > 48* 58* 68* 56*  HEPARINUNFRC  --   --   --   < > 0.75*  --  0.79* 0.68  CREATININE 1.26  --  1.16  --  1.13  --   --   --   < > = values in this interval not displayed.  Estimated Creatinine Clearance: 39.4 mL/min (by C-G formula based on Cr of 1.13).   Infusions:  . heparin 1,150 Units/hr (07/30/14 0310)    Assessment: 81 yo male with afib is currently on IV heparin.  His aPTT level was 56 which is on subtherapeutic.  Patient was on apixaban, last dose was 02/09 @ 1015,  HL seems to still be falsely elevated at 0.68.   Goal of Therapy:  aPTT 66-102 seconds; heparin level 0.3-0.7 units/ml Monitor platelets by anticoagulation protocol: Yes   Plan:  - increase heparin to 1250units/hr - 8hr  aPTT  Heide Guile, PharmD, BCPS Clinical Pharmacist Pager [phone]    Addendum: APTT is still below goal at 56s on 1250 units/hr. Will increase heparin to 1400 units/hr and follow up AM labs.  Nena [name], PharmD, BCPS 07/30/2014, 11:12 PM

## 2014-07-30 NOTE — Clinical Social Work Note (Signed)
CSW contacted [facility] to make sure patient can still be admitted to SNF once he is medically ready, SNF said yes whenever he is ready they still accept him.  Jones Broom. [place], MSW, [place] 07/30/2014 2:01 PM

## 2014-07-30 NOTE — Progress Notes (Addendum)
[facility] PROGRESS NOTE  [name] ZSW:[id number] DOB: 23-Nov-1922 DOA: 07/24/2014 PCP: Mathews Argyle, MD  Brief assessment  81 year old male with history of CAD status post CABG, aortic stenosis, paroxysmal atrial fibrillation, congestive heart failure with ejection fraction of 25-30%, peripheral vascular disease, who presented with a skin ulcer on the right foot the was noticed approximately 4 days prior to admission. He had been seen by his primary care doctor and started on oral antibiotics, however because he was getting progressively worse he was told to come to the emergency department for evaluation. In the emergency department, the physician thought the patient had cellulitis unresponsive to oral antibiotics. An x-ray was negative for osteomyelitis. His white blood cell count was 18.4. He was found to have gangrene of the right foot, vascular surgery was consulted as was orthopedic surgery. Most likely, the patient will need to undergo amputation of the right foot or decide on palliative care. Despite antibiotics, he has had persistent leukocytosis and worsening appearance of his foot.  He has related delirium requiring a sitter.    Assessment/Plan:  Right foot cellulitis with gangrenous ulcer of the plantar aspect of first metatarsal head, progressive foul odor and erythema despite antibiotics.  - Foot ulcer secondary to Diabetes with PVD and gangrene.  -  Dr. Sharol Given and Dr. Oneida Alar consulted -  continue vanc and zosyn, day 5 -  Continue florastor -  Still awaiting decision from family regarding amputation.  Needs amputation or hospice care.  Sons are coming in this evening to assess his foot again.    Delirium secondary to limb ischemia -  Continue antibiotics -  Frequent reorientation -  Sitter at bedside  A fib; continue with amiodarone, carvedilol.  Hold eliquis in anticipation of surgery.   -  Continue heparin gtt   Diabetes: hold glipizide.  Labile CBG.   -   Continue with SSI.  -  Increase levemir to 7 units -  Continue standing aspart with meals  HLD; continue with Lipitor.   Hypothyroidism; continue with synthroid.   Systolic HF, Ef 25 to 32%; by ECHO 2015. Appears compensated.  - Continue with lasix.  - Hold lisinopril due to low blood pressures  Throat Cancer, S/P trach; Patient need to have TEP exchange. I spoke with Dr Erik Obey, ([phone] phone), Candice from speech therapy at [facility]:  [phone].  He does have a small central leak. -  Supplies cannot be given by [facility] to be delivered here or to family -  Recommend thickened diet -  Dr. Erik Obey states he will come to assess valve nonurgently   Fall despite presence of staff.  Inability to follow instructions due to delirium -  PT/OT following -  Fall precautions  Code Status:  DNR Family Communication: Care discussed with daughter Zalan Shidler HPOA, and sons Merry Proud and [place] Disposition Plan: likely SNF for rehab vs. Hospice depending on family's decision   Consultants:  Ortho, Dr Sharol Given.   Vasc, Dr. Oneida Alar  Procedures:  ABI  Antibiotics:  Vancomycin 2-6  Zosyn 2-6  HPI/Subjective:  Patient confused.  States he feels comfortable except his right foot "doesn't work"    Objective: Filed Vitals:   07/30/14 1325  BP: 127/67  Pulse: 76  Temp: 98.6 F (37 C)  Resp: 20    Intake/Output Summary (Last 24 hours) at 07/30/14 1520 Last data filed at 07/30/14 1500  Gross per 24 hour  Intake    460 ml  Output    400 ml  Net  60 ml   Filed Weights   07/29/14 0500 07/30/14 0530 07/30/14 0743  Weight: 62.2 kg (137 lb 2 oz) 60.9 kg (134 lb 4.2 oz) 62.9 kg (138 lb 10.7 oz)    Exam:   General:  Alert, cachetic, in no distress.  Cardiovascular: S 1, S 2 RRR  Respiratory: CTAB  Abdomen: BS, present, nt  Musculoskeletal: no edema.   Extremity: right gangrenous ulcer plantar aspect of first metatarsal.  Erythema to the mid-foot.  Foul odor and more  wet-appearing. Psych:  Oriented to person, but not place or time. Very confused  Data Reviewed: Basic Metabolic Panel:  Recent Labs Lab 07/26/14 0540 07/27/14 0451 07/27/14 1606 07/28/14 0556 07/29/14 0615  NA 135 133* 135 136 131*  K 4.4 4.0 4.2 3.7 4.1  CL 98 98 97 103 97  CO2 32 27 27 21 25   GLUCOSE 203* 278* 150* 63* 429*  BUN 26* 29* 28* 26* 28*  CREATININE 1.22 1.37* 1.26 1.16 1.13  CALCIUM 8.4 7.9* 8.2* 8.2* 7.4*   Liver Function Tests:  Recent Labs Lab 07/24/14 1552 07/25/14 0440 07/27/14 0451  AST 15 13  --   ALT 19 16  --   ALKPHOS 95 78  --   BILITOT 0.5 0.3  --   PROT 6.8 5.7*  --   ALBUMIN 3.2* 2.6* 2.6*   No results for input(s): LIPASE, AMYLASE in the last 168 hours. No results for input(s): AMMONIA in the last 168 hours. CBC:  Recent Labs Lab 07/24/14 1552 07/25/14 0440 07/26/14 0540 07/27/14 0451 07/28/14 0556 07/29/14 0615 07/30/14 0148  WBC 18.4* 16.5* 12.7* 12.6* 15.6* 13.3* 15.2*  NEUTROABS 17.0* 14.6*  --   --   --   --   --   HGB 12.3* 10.5* 11.5* 11.0* 11.2* 10.9* 10.3*  HCT 36.4* 31.7* 34.9* 32.8* 36.7* 32.4* 30.5*  MCV 92.9 93.2 94.3 91.9 91.8 92.3 91.0  PLT 408* 362 369 345 405* 384 406*   Cardiac Enzymes: No results for input(s): CKTOTAL, CKMB, CKMBINDEX, TROPONINI in the last 168 hours. BNP (last 3 results)  Recent Labs  07/12/14 2121  BNP 315.7*    ProBNP (last 3 results)  Recent Labs  02/12/14 0728 03/17/14 1030 03/18/14 0320  PROBNP 9440.0* 5030.0* 7135.0*    CBG:  Recent Labs Lab 07/29/14 1252 07/29/14 1803 07/29/14 2125 07/30/14 0749 07/30/14 1204  GLUCAP 306* 160* 114* 237* 200*    Recent Results (from the past 240 hour(s))  Culture, blood (routine x 2)     Status: None (Preliminary result)   Collection Time: 07/24/14  6:30 PM  Result Value Ref Range Status   Specimen Description BLOOD RIGHT ARM  Final   Special Requests   Final    BOTTLES DRAWN AEROBIC AND ANAEROBIC 4CC BLUE [place] RED    Culture   Final           BLOOD CULTURE RECEIVED NO GROWTH TO DATE CULTURE WILL BE HELD FOR 5 DAYS BEFORE ISSUING A FINAL NEGATIVE REPORT Note: Culture results may be compromised due to an excessive volume of blood received in culture bottles. Performed at [facility]    Report Status PENDING  Incomplete  Culture, blood (routine x 2)     Status: None (Preliminary result)   Collection Time: 07/24/14  6:34 PM  Result Value Ref Range Status   Specimen Description BLOOD LEFT ARM  Final   Special Requests BOTTLES DRAWN AEROBIC AND ANAEROBIC 10CC  Final   Culture  Final           BLOOD CULTURE RECEIVED NO GROWTH TO DATE CULTURE WILL BE HELD FOR 5 DAYS BEFORE ISSUING A FINAL NEGATIVE REPORT Note: Culture results may be compromised due to an inadequate volume of blood received in culture bottles. Performed at [facility]    Report Status PENDING  Incomplete  Wound culture     Status: None   Collection Time: 07/24/14  6:40 PM  Result Value Ref Range Status   Specimen Description WOUND RIGHT FOOT  Final   Special Requests NONE  Final   Gram Stain   Final    NO WBC SEEN NO SQUAMOUS EPITHELIAL CELLS SEEN FEW GRAM POSITIVE COCCI IN PAIRS Performed at [facility]    Culture   Final    ABUNDANT STAPHYLOCOCCUS AUREUS Note: RIFAMPIN AND GENTAMICIN SHOULD NOT BE USED AS SINGLE DRUGS FOR TREATMENT OF STAPH INFECTIONS. Performed at [facility]    Report Status 07/27/2014 FINAL  Final   Organism ID, Bacteria STAPHYLOCOCCUS AUREUS  Final      Susceptibility   Staphylococcus aureus - MIC*    CLINDAMYCIN <=0.25 SENSITIVE Sensitive     ERYTHROMYCIN <=0.25 SENSITIVE Sensitive     GENTAMICIN <=0.5 SENSITIVE Sensitive     LEVOFLOXACIN 0.25 SENSITIVE Sensitive     OXACILLIN 0.5 SENSITIVE Sensitive     PENICILLIN >=0.5 RESISTANT Resistant     RIFAMPIN <=0.5 SENSITIVE Sensitive     TRIMETH/SULFA <=10 SENSITIVE Sensitive     VANCOMYCIN 1 SENSITIVE Sensitive      TETRACYCLINE >=16 RESISTANT Resistant     MOXIFLOXACIN <=0.25 SENSITIVE Sensitive     * ABUNDANT STAPHYLOCOCCUS AUREUS     Studies: No results found.  Scheduled Meds: . amiodarone  200 mg Oral Daily  . aspirin EC  81 mg Oral Once per day on Mon Thu  . atorvastatin  10 mg Oral Daily  . ferrous sulfate  325 mg Oral Q breakfast  . finasteride  5 mg Oral Daily  . fluticasone  2 spray Each Nare Daily  . furosemide  20 mg Oral Daily  . insulin aspart  0-9 Units Subcutaneous TID WC  . insulin aspart  2 Units Subcutaneous TID WC  . [START ON 07/31/2014] insulin detemir  7 Units Subcutaneous Daily  . levothyroxine  100 mcg Oral QAC breakfast  . piperacillin-tazobactam (ZOSYN)  IV  3.375 g Intravenous Q8H  . sodium chloride  3 mL Intravenous Q12H  . sodium chloride  3 mL Intravenous Q12H  . vancomycin  1,000 mg Intravenous Q24H   Continuous Infusions: . heparin 1,250 Units/hr (07/30/14 1453)    Active Problems:   Peripheral vascular disease   DM (diabetes mellitus)   Cardiomyopathy- EF 25-30%   History of throat cancer   Cellulitis   Hypomagnesemia   Anemia   Diabetic ulcer of right foot    Time spent: 35 minutes.     Deiondra Denley, [place] [phone]. If 7PM-7AM, please contact night-coverage at [email or web address], password [facility] 07/30/2014, 3:20 PM  LOS: 6 days

## 2014-07-30 NOTE — Progress Notes (Signed)
Wrist restraints also placed at this time.

## 2014-07-31 DIAGNOSIS — E1152 Type 2 diabetes mellitus with diabetic peripheral angiopathy with gangrene: Secondary | ICD-10-CM

## 2014-07-31 LAB — CBC
HEMATOCRIT: 29.3 % — AB (ref 39.0–52.0)
HEMOGLOBIN: 9.9 g/dL — AB (ref 13.0–17.0)
MCH: 31.2 pg (ref 26.0–34.0)
MCHC: 33.8 g/dL (ref 30.0–36.0)
MCV: 92.4 fL (ref 78.0–100.0)
Platelets: 378 10*3/uL (ref 150–400)
RBC: 3.17 MIL/uL — AB (ref 4.22–5.81)
RDW: 13.8 % (ref 11.5–15.5)
WBC: 12.2 10*3/uL — AB (ref 4.0–10.5)

## 2014-07-31 LAB — GLUCOSE, CAPILLARY
GLUCOSE-CAPILLARY: 259 mg/dL — AB (ref 70–99)
GLUCOSE-CAPILLARY: 102 mg/dL — AB (ref 70–99)
Glucose-Capillary: 204 mg/dL — ABNORMAL HIGH (ref 70–99)

## 2014-07-31 LAB — APTT: APTT: 116 s — AB (ref 24–37)

## 2014-07-31 LAB — HEPARIN LEVEL (UNFRACTIONATED): HEPARIN UNFRACTIONATED: 0.86 [IU]/mL — AB (ref 0.30–0.70)

## 2014-07-31 MED ORDER — FLUTICASONE PROPIONATE 50 MCG/ACT NA SUSP: 2.0000 | Freq: Every day | NASAL | Status: DC | PRN

## 2014-07-31 MED ORDER — INSULIN DETEMIR 100 UNIT/ML ~~LOC~~ SOLN: 10.0000 [IU] | Freq: Every day | SUBCUTANEOUS | Status: DC

## 2014-07-31 MED ORDER — POLYETHYLENE GLYCOL 3350 17 G PO PACK: 17.0000 g | PACK | Freq: Every day | ORAL | Status: DC | PRN

## 2014-07-31 MED ORDER — BISACODYL 5 MG PO TBEC: 10.0000 mg | DELAYED_RELEASE_TABLET | Freq: Every day | ORAL | Status: DC | PRN

## 2014-07-31 MED ORDER — MORPHINE SULFATE 15 MG PO TABS
15.0000 mg | ORAL_TABLET | ORAL | Status: DC | PRN
  Administered 2014-07-31 – 2014-08-02 (×3): 15 mg via ORAL
  Filled 2014-07-31 (×3): qty 1

## 2014-07-31 MED ORDER — LISINOPRIL 2.5 MG PO TABS: 2.5000 mg | ORAL_TABLET | Freq: Every day | ORAL | Status: DC

## 2014-07-31 MED ORDER — LORAZEPAM 1 MG PO TABS: 1.0000 mg | ORAL_TABLET | ORAL | Status: DC | PRN

## 2014-07-31 NOTE — Progress Notes (Signed)
[facility] PROGRESS NOTE  [name] GEX:[id number] DOB: 06-03-1923 DOA: 07/24/2014 PCP: Mathews Argyle, MD  Brief assessment  81 year old male with history of CAD status post CABG, aortic stenosis, paroxysmal atrial fibrillation, congestive heart failure with ejection fraction of 25-30%, peripheral vascular disease, who presented with a skin ulcer on the right foot the was noticed approximately 4 days prior to admission. He had been seen by his primary care doctor and started on oral antibiotics, however because he was getting progressively worse he was told to come to the emergency department for evaluation. In the emergency department, the physician thought the patient had cellulitis unresponsive to oral antibiotics. An x-ray was negative for osteomyelitis. His white blood cell count was 18.4. He was found to have gangrene of the right foot, vascular surgery was consulted as was orthopedic surgery. Most likely, the patient will need to undergo amputation of the right foot or decide on palliative care. Despite antibiotics, he has had persistent leukocytosis and worsening appearance of his foot.  He has related delirium requiring a sitter.    Assessment/Plan:  Right foot gangrene and cellulitis, plantar aspect of first metatarsal head secondary to Diabetes with PVD  -  Dr. Sharol Given and Dr. Oneida Alar consulted -  continue vanc and zosyn, day 6 -  Continue florastor -  Still awaiting decision from family regarding amputation.  Needs amputation or hospice care.  -  Patient is high risk for surgery.  RCRI 1 in 20 chance of death/cardiac risk  Delirium secondary to limb ischemia, slightly improved today -  Continue antibiotics -  Frequent reorientation  A fib; continue with amiodarone, carvedilol.  Hold eliquis in anticipation of surgery.   -  Continue heparin gtt   Diabetes: hold glipizide.  Labile CBG.   -  Continue with SSI.  -  Increase levemir to 10 units -  Continue standing  aspart with meals  HLD; continue with Lipitor.   Hypothyroidism; continue with synthroid.   Systolic HF, Ef 25 to 01%; by ECHO 2015. Appears compensated.  - Continue with lasix.  - resume lisinopril  Throat Cancer, S/P trach; Patient need to have TEP exchange. I spoke with Dr Erik Obey, ([phone] phone), Candice from speech therapy at [facility]:  [phone].  He does have a small central leak. -  Supplies cannot be given by [facility] to be delivered here or to family -  Recommend thickened diet -  Dr. Erik Obey states he will come to assess valve nonurgently   Fall despite presence of staff.  Inability to follow instructions due to delirium -  PT/OT following -  Fall precautions  Code Status:  DNR Family Communication: Care discussed with daughter [name] HPOA, and son [name] Disposition Plan: likely SNF for rehab vs. Hospice depending on family's decision   Consultants:  Ortho, Dr Sharol Given.   Vasc, Dr. Oneida Alar  Procedures:  ABI  Antibiotics:  Vancomycin 2-6  Zosyn 2-6  HPI/Subjective:  Less confused today. Understands that his choices are poor.    Objective: Filed Vitals:   07/31/14 1339  BP: 136/56  Pulse: 81  Temp: 96.7 F (35.9 C)  Resp: 20    Intake/Output Summary (Last 24 hours) at 07/31/14 1534 Last data filed at 07/31/14 1226  Gross per 24 hour  Intake    780 ml  Output   1395 ml  Net   -615 ml   Filed Weights   07/30/14 0743 07/31/14 0506 07/31/14 0900  Weight: 62.9 kg (138 lb 10.7 oz) 64.8 kg (  142 lb 13.7 oz) 64.4 kg (141 lb 15.6 oz)    Exam:   General:  Alert, cachetic, in no distress.  Cardiovascular: S 1, S 2 RRR  Respiratory: CTAB  Abdomen: BS, present, nt  Musculoskeletal: no edema.   Extremity: right gangrenous ulcer plantar aspect of first metatarsal.  Erythema about a third of the way up the foot.  Foul odor  Data Reviewed: Basic Metabolic Panel:  Recent Labs Lab 07/26/14 0540 07/27/14 0451 07/27/14 1606  07/28/14 0556 07/29/14 0615  NA 135 133* 135 136 131*  K 4.4 4.0 4.2 3.7 4.1  CL 98 98 97 103 97  CO2 32 27 27 21 25   GLUCOSE 203* 278* 150* 63* 429*  BUN 26* 29* 28* 26* 28*  CREATININE 1.22 1.37* 1.26 1.16 1.13  CALCIUM 8.4 7.9* 8.2* 8.2* 7.4*   Liver Function Tests:  Recent Labs Lab 07/24/14 1552 07/25/14 0440 07/27/14 0451  AST 15 13  --   ALT 19 16  --   ALKPHOS 95 78  --   BILITOT 0.5 0.3  --   PROT 6.8 5.7*  --   ALBUMIN 3.2* 2.6* 2.6*   No results for input(s): LIPASE, AMYLASE in the last 168 hours. No results for input(s): AMMONIA in the last 168 hours. CBC:  Recent Labs Lab 07/24/14 1552 07/25/14 0440  07/27/14 0451 07/28/14 0556 07/29/14 0615 07/30/14 0148 07/31/14 0438  WBC 18.4* 16.5*  < > 12.6* 15.6* 13.3* 15.2* 12.2*  NEUTROABS 17.0* 14.6*  --   --   --   --   --   --   HGB 12.3* 10.5*  < > 11.0* 11.2* 10.9* 10.3* 9.9*  HCT 36.4* 31.7*  < > 32.8* 36.7* 32.4* 30.5* 29.3*  MCV 92.9 93.2  < > 91.9 91.8 92.3 91.0 92.4  PLT 408* 362  < > 345 405* 384 406* 378  < > = values in this interval not displayed. Cardiac Enzymes: No results for input(s): CKTOTAL, CKMB, CKMBINDEX, TROPONINI in the last 168 hours. BNP (last 3 results)  Recent Labs  07/12/14 2121  BNP 315.7*    ProBNP (last 3 results)  Recent Labs  02/12/14 0728 03/17/14 1030 03/18/14 0320  PROBNP 9440.0* 5030.0* 7135.0*    CBG:  Recent Labs Lab 07/30/14 1204 07/30/14 1733 07/30/14 2120 07/31/14 0745 07/31/14 1145  GLUCAP 200* 101* 225* 204* 259*    Recent Results (from the past 240 hour(s))  Culture, blood (routine x 2)     Status: None (Preliminary result)   Collection Time: 07/24/14  6:30 PM  Result Value Ref Range Status   Specimen Description BLOOD RIGHT ARM  Final   Special Requests   Final    BOTTLES DRAWN AEROBIC AND ANAEROBIC 4CC BLUE [place] RED   Culture   Final           BLOOD CULTURE RECEIVED NO GROWTH TO DATE CULTURE WILL BE HELD FOR 5 DAYS BEFORE ISSUING A  FINAL NEGATIVE REPORT Note: Culture results may be compromised due to an excessive volume of blood received in culture bottles. Performed at [facility]    Report Status PENDING  Incomplete  Culture, blood (routine x 2)     Status: None (Preliminary result)   Collection Time: 07/24/14  6:34 PM  Result Value Ref Range Status   Specimen Description BLOOD LEFT ARM  Final   Special Requests BOTTLES DRAWN AEROBIC AND ANAEROBIC 10CC  Final   Culture   Final  BLOOD CULTURE RECEIVED NO GROWTH TO DATE CULTURE WILL BE HELD FOR 5 DAYS BEFORE ISSUING A FINAL NEGATIVE REPORT Note: Culture results may be compromised due to an inadequate volume of blood received in culture bottles. Performed at [facility]    Report Status PENDING  Incomplete  Wound culture     Status: None   Collection Time: 07/24/14  6:40 PM  Result Value Ref Range Status   Specimen Description WOUND RIGHT FOOT  Final   Special Requests NONE  Final   Gram Stain   Final    NO WBC SEEN NO SQUAMOUS EPITHELIAL CELLS SEEN FEW GRAM POSITIVE COCCI IN PAIRS Performed at [facility]    Culture   Final    ABUNDANT STAPHYLOCOCCUS AUREUS Note: RIFAMPIN AND GENTAMICIN SHOULD NOT BE USED AS SINGLE DRUGS FOR TREATMENT OF STAPH INFECTIONS. Performed at [facility]    Report Status 07/27/2014 FINAL  Final   Organism ID, Bacteria STAPHYLOCOCCUS AUREUS  Final      Susceptibility   Staphylococcus aureus - MIC*    CLINDAMYCIN <=0.25 SENSITIVE Sensitive     ERYTHROMYCIN <=0.25 SENSITIVE Sensitive     GENTAMICIN <=0.5 SENSITIVE Sensitive     LEVOFLOXACIN 0.25 SENSITIVE Sensitive     OXACILLIN 0.5 SENSITIVE Sensitive     PENICILLIN >=0.5 RESISTANT Resistant     RIFAMPIN <=0.5 SENSITIVE Sensitive     TRIMETH/SULFA <=10 SENSITIVE Sensitive     VANCOMYCIN 1 SENSITIVE Sensitive     TETRACYCLINE >=16 RESISTANT Resistant     MOXIFLOXACIN <=0.25 SENSITIVE Sensitive     * ABUNDANT STAPHYLOCOCCUS AUREUS      Studies: No results found.  Scheduled Meds: . amiodarone  200 mg Oral Daily  . aspirin EC  81 mg Oral Once per day on Mon Thu  . atorvastatin  10 mg Oral Daily  . ferrous sulfate  325 mg Oral Q breakfast  . finasteride  5 mg Oral Daily  . fluticasone  2 spray Each Nare Daily  . furosemide  20 mg Oral Daily  . insulin aspart  0-9 Units Subcutaneous TID WC  . insulin aspart  2 Units Subcutaneous TID WC  . insulin detemir  7 Units Subcutaneous Daily  . levothyroxine  100 mcg Oral QAC breakfast  . piperacillin-tazobactam (ZOSYN)  IV  3.375 g Intravenous Q8H  . sodium chloride  3 mL Intravenous Q12H  . sodium chloride  3 mL Intravenous Q12H  . vancomycin  1,000 mg Intravenous Q24H   Continuous Infusions: . heparin 1,300 Units/hr (07/31/14 1242)    Active Problems:   Peripheral vascular disease   DM (diabetes mellitus)   Cardiomyopathy- EF 25-30%   History of throat cancer   Cellulitis   Hypomagnesemia   Anemia   Diabetic ulcer of right foot    Time spent: 35 minutes.     Inanna Telford, [place] [phone]. If 7PM-7AM, please contact night-coverage at [email or web address], password [facility] 07/31/2014, 3:34 PM  LOS: 7 days

## 2014-07-31 NOTE — Progress Notes (Addendum)
ANTICOAGULATION CONSULT NOTE - Follow Up Consult  Pharmacy Consult for heparin Indication: atrial fibrillation  No Known Allergies  Patient Measurements: Height: 5\' 8"  (172.7 cm) Weight: 141 lb 15.6 oz (64.4 kg) IBW/kg (Calculated) : 68.4 Heparin Dosing Weight: 60 kg  Vital Signs: Temp: 99.3 F (37.4 C) (02/13 0900) Temp Source: Oral (02/13 0900) BP: 99/36 mmHg (02/13 0900) Pulse Rate: 75 (02/13 0900)  Labs:  Recent Labs  07/29/14 0615  07/30/14 0148 07/30/14 1150 07/30/14 2200 07/31/14 0438  HGB 10.9*  --  10.3*  --   --  9.9*  HCT 32.4*  --  30.5*  --   --  29.3*  PLT 384  --  406*  --   --  378  APTT 48*  < > 68* 56* 56* 116*  HEPARINUNFRC 0.75*  --  0.79* 0.68  --  0.86*  CREATININE 1.13  --   --   --   --   --   < > = values in this interval not displayed.  Estimated Creatinine Clearance: 40.4 mL/min (by C-G formula based on Cr of 1.13).   Infusions:  . heparin 1,400 Units/hr (07/30/14 2321)    Assessment: 81 yo male with afib is currently on IV heparin.  His aPTT level was 116 which is on above the goal of 66-102.  Heparin level is 0.86 which seems to be correlating with the PTT value.  Patient was on apixaban, last dose was 02/09 @ 1015.  Platelets are stable.  Also on Vancomycin 1g IV daily - will check a trough this evening with heparin level to confirm dose.  Goal of Therapy:  aPTT 66-102 seconds; heparin level 0.3-0.7 units/ml Monitor platelets by anticoagulation protocol: Yes   Plan:  - Decrease heparin drip to 1300 units/hr - Heparin level in 6 hours - vancomycin trough tonight  Heide Guile, PharmD, BCPS Clinical Pharmacist Pager [phone]

## 2014-07-31 NOTE — Progress Notes (Signed)
Patient's family has decided against surgery and would like to pursue hospice care at [facility].  They are in agreement that he be made as comfortable as possible.  We discussed continuing only medications for comfort and stopping all injections and painful procedures, including CBGs and insulin injections.  Have placed SW consult for SNF and made necessary changes to medications.  Will plan to stop vancomycin so he does not need any more troughs drawn, but will continue zosyn until he is discharged.  After discharge, no further IVF, antibiotics, no artificial feeding.

## 2014-08-01 DIAGNOSIS — R410 Disorientation, unspecified: Secondary | ICD-10-CM

## 2014-08-01 LAB — CULTURE, BLOOD (ROUTINE X 2)
Culture: NO GROWTH
Culture: NO GROWTH

## 2014-08-01 MED ORDER — BISACODYL 5 MG PO TBEC: 10.0000 mg | DELAYED_RELEASE_TABLET | Freq: Every day | ORAL | Status: AC | PRN

## 2014-08-01 MED ORDER — POLYETHYLENE GLYCOL 3350 17 G PO PACK: 17.0000 g | PACK | Freq: Every day | ORAL | Status: AC | PRN

## 2014-08-01 MED ORDER — MORPHINE SULFATE 15 MG PO TABS: 15.0000 mg | ORAL_TABLET | ORAL | Status: AC | PRN

## 2014-08-01 MED ORDER — LORAZEPAM 1 MG PO TABS: 1.0000 mg | ORAL_TABLET | ORAL | Status: AC | PRN

## 2014-08-01 MED ORDER — FLUTICASONE PROPIONATE 50 MCG/ACT NA SUSP: 2.0000 | Freq: Every day | NASAL | Status: AC | PRN

## 2014-08-01 NOTE — Clinical Social Work Note (Signed)
CSW made aware patient ready for d/c to [facility]. CSW contacted [facility] [employer] [facility] and spoke with [employer] Per Larene Beach, facility cannot accept patient on this date as pharmacy and admissions is closed. Larene Beach states facility can receive patient in the am on 2/15, Monday. CSW to make MD (Short) and RN aware. CSW to follow tomorrow.  [place], [place] Weekend Clinical Social Worker [phone]

## 2014-08-01 NOTE — Discharge Summary (Signed)
Physician Discharge Summary  [name] SEG:[id number] DOB: 04-03-1923 DOA: 07/24/2014  PCP: Mathews Argyle, MD  Admit date: 07/24/2014 Discharge date: 08/01/2014  Recommendations for Outpatient Follow-up:  1. Transfer to SNF for hospice care, MOST and DNR filled out  Discharge Diagnoses:  Principal Problem:   Type 2 diabetes mellitus with diabetic peripheral angiopathy with gangrene Active Problems:   Peripheral vascular disease   DM (diabetes mellitus)   Cardiomyopathy- EF 25-30%   History of throat cancer   Cellulitis   Hypomagnesemia   Anemia   Diabetic ulcer of right foot   Discharge Condition: fair  Diet recommendation: regular with thin  Wt Readings from Last 3 Encounters:  08/01/14 62.6 kg (138 lb 0.1 oz)  07/17/14 65.5 kg (144 lb 6.4 oz)  07/12/14 68.04 kg (150 lb)    History of present illness/brief summary of hospital course:  81 year old male with history of CAD status post CABG, aortic stenosis, paroxysmal atrial fibrillation, chronic systolic heart failure with ejection fraction of 25-30%, peripheral vascular disease, tracheostomy secondary to head and neck cancer, who presented with a skin ulcer on his right foot for at least 4 days prior to presentation. He had been seen by his primary care doctor approximately 3 days prior to presentation and started on oral antibiotics, however the redness continued to worsen. In the emergency department, his x-ray was negative for osteomyelitis, his white blood cell count was 18.4, and he was admitted for antibiotics and assessment for worsening peripheral vascular disease.  He was found to have gangrene of the right foot. Vascular surgery and orthopedic surgery were consulted.  He was deemed high risk for surgery. He was started on IV vancomycin and zosyn and had a partial clinical response and some improvement in his leukocytosis and fevers. His course has been complicated by delirium.  His family ultimately decided against  surgery and have chosen hospice care.    Hospital Course:   Right foot gangrene and cellulitis, plantar aspect of first metatarsal head secondary to Diabetes with PVD  - Appreciate Orthopedics Dr. Sharol Given and Vascular Dr. Oneida Alar assistance - no further antibiotics - Patient is high risk for surgery. RCRI 1 in 20 chance of death/cardiac risk  Delirium secondary to limb ischemia, slightly improved over last few days - Frequent reorientation  Paroxysmal A fib -  Stopped all oral medications not directly related to comfort, including Eliquis, amiodarone, and beta blocker  Diabetes  -  Initially managed with sliding scale insulin -  No further CBGs or insulin injections -  Changed to regular diet  HLD; stopped Lipitor.   Hypothyroidism; stopped synthroid.   Systolic HF, Ef 25 to 73%; by ECHO 2015. Appears compensated.  - May continue morphine as needed for shortness of breath  Throat Cancer, S/P trach; -  If there are problems with his prosthesis, you can contact Candice from speech therapy at [facility] at [phone]   -  He has a small central leak of his prosthesis for which we had initially given him a slightly thickened diet, however given the goal of making him as comfortable as possible we have changed him from nectar thick to thin liquids -  ENT Dr Erik Obey, ([phone] phone), is also familiar with his care   Fall despite presence of staff. Inability to follow instructions due to delirium.  PT/OT recommending skilled nursing facility.    Anemia of chronic disease, stable   Consultants:  Ortho, Dr Sharol Given.   Vasc, Dr. Oneida Alar  Procedures:  ABI  Antibiotics:  Vancomycin 2-6 > 2/13  Zosyn 2-6 > 2/14  Discharge Exam: Filed Vitals:   08/01/14 0626  BP: 140/55  Pulse: 77  Temp: 99.1 F (37.3 C)  Resp: 17   Filed Vitals:   07/31/14 0900 07/31/14 1339 07/31/14 2127 08/01/14 0626  BP: 99/36 136/56 134/53 140/55  Pulse: 75 81 70 77  Temp: 99.3 F (37.4 C)  96.7 F (35.9 C) 99.1 F (37.3 C) 99.1 F (37.3 C)  TempSrc: Oral Axillary Oral   Resp: 20 20 18 17   Height:      Weight: 64.4 kg (141 lb 15.6 oz)   62.6 kg (138 lb 0.1 oz)  SpO2: 98% 99% 100% 98%     General: Alert, cachetic, in no distress.  Heent:  Tracheostomy c/d.  Prosthesis in good position, voice is stable  Cardiovascular: S 1, S 2 RRR  Respiratory: CTAB  Abdomen: BS, present, nt  Musculoskeletal: no edema.   Extremity: right gangrenous ulcer plantar aspect of first metatarsal. light pink erythema about a third of the way up the foot. Foul odor  Discharge Instructions      Discharge Instructions    Call MD for:  difficulty breathing, headache or visual disturbances    Complete by:  As directed      Call MD for:  extreme fatigue    Complete by:  As directed      Call MD for:  hives    Complete by:  As directed      Call MD for:  persistant dizziness or light-headedness    Complete by:  As directed      Call MD for:  persistant nausea and vomiting    Complete by:  As directed      Call MD for:  severe uncontrolled pain    Complete by:  As directed      Diet general    Complete by:  As directed      Discharge wound care:    Complete by:  As directed   Dry dressing with tape as needed.     Increase activity slowly    Complete by:  As directed             Medication List    STOP taking these medications        acarbose 100 MG tablet  Commonly known as:  PRECOSE     amiodarone 400 MG tablet  Commonly known as:  PACERONE     aspirin EC 81 MG tablet     atorvastatin 10 MG tablet  Commonly known as:  LIPITOR     carvedilol 6.25 MG tablet  Commonly known as:  COREG     ELIQUIS 2.5 MG Tabs tablet  Generic drug:  apixaban     ferrous sulfate 325 (65 FE) MG tablet     finasteride 5 MG tablet  Commonly known as:  PROSCAR     furosemide 20 MG tablet  Commonly known as:  LASIX     glipiZIDE 10 MG tablet  Commonly known as:  GLUCOTROL      levothyroxine 100 MCG tablet  Commonly known as:  SYNTHROID, LEVOTHROID     lisinopril 5 MG tablet  Commonly known as:  PRINIVIL,ZESTRIL     metFORMIN 500 MG tablet  Commonly known as:  GLUCOPHAGE     methylPREDNISolone 4 MG tablet  Commonly known as:  MEDROL DOSEPAK     potassium chloride SA 20 MEQ tablet  Commonly known as:  K-DUR,KLOR-CON     senna-docusate 8.6-50 MG per tablet  Commonly known as:  Senokot-S     traMADol 50 MG tablet  Commonly known as:  ULTRAM      TAKE these medications        bisacodyl 5 MG EC tablet  Commonly known as:  DULCOLAX  Take 2 tablets (10 mg total) by mouth daily as needed for moderate constipation.     fluticasone 50 MCG/ACT nasal spray  Commonly known as:  FLONASE  Place 2 sprays into both nostrils daily as needed for allergies or rhinitis.     LORazepam 1 MG tablet  Commonly known as:  ATIVAN  Take 1 tablet (1 mg total) by mouth every 4 (four) hours as needed for anxiety or sleep.     morphine 15 MG tablet  Commonly known as:  MSIR  Take 1 tablet (15 mg total) by mouth every 4 (four) hours as needed for moderate pain or severe pain (or shortness of breath).     polyethylene glycol packet  Commonly known as:  MIRALAX / GLYCOLAX  Take 17 g by mouth daily as needed for mild constipation.          The results of significant diagnostics from this hospitalization (including imaging, microbiology, ancillary and laboratory) are listed below for reference.    Significant Diagnostic Studies: Ct Head Wo Contrast  07/14/2014   CLINICAL DATA:  Patient was seen here 2 days ago for a fluid infection that has improved. Tonight the family found lying on the floor at home. No recollection of the fall or striking head. Confusion.  EXAM: CT HEAD WITHOUT CONTRAST  TECHNIQUE: Contiguous axial images were obtained from the base of the skull through the vertex without intravenous contrast.  COMPARISON:  03/17/2014  FINDINGS: Diffuse cerebral  atrophy. Ventricular dilatation consistent with central atrophy. Low-attenuation changes in the deep white matter consistent with small vessel ischemia. Probable old encephalomalacia in the left parietal region. No change since prior study. No mass effect or midline shift. No abnormal extra-axial fluid collections. Gray-white matter junctions are distinct. Basal cisterns are not effaced. No evidence of acute intracranial hemorrhage. No depressed skull fractures. Diffuse opacification of the left frontal sinus and ethmoid air cells with mucosal thickening in the left maxillary antrum. Postoperative changes in the left maxillary antrum. Probable polyp or retention cyst in the left anterior nasal passage. Paranasal sinus changes are improved since prior study.  IMPRESSION: No acute intracranial abnormalities. Chronic atrophy and small vessel ischemic changes. Improving inflammatory changes in the paranasal sinuses.   Electronically Signed   By: Lucienne Capers M.D.   On: 07/14/2014 23:36   Dg Chest Port 1 View  07/15/2014   CLINICAL DATA:  Weakness and shortness of breath.  EXAM: PORTABLE CHEST - 1 VIEW  COMPARISON:  04/06/2014  FINDINGS: Postoperative changes in the mediastinum. Mild cardiac enlargement without vascular congestion. No focal airspace disease or consolidation in the lungs. No blunting of costophrenic angles. No pneumothorax.  IMPRESSION: Mild cardiac enlargement.  No evidence of active pulmonary disease.   Electronically Signed   By: Lucienne Capers M.D.   On: 07/15/2014 00:34   Dg Foot Complete Right  07/24/2014   CLINICAL DATA:  Right foot pain, plantar surface ulcer beneath the great toe  EXAM: RIGHT FOOT COMPLETE - 3+ VIEW  COMPARISON:  07/16/2014  FINDINGS: There has been interval development of soft tissue gas and swelling in the plantar soft tissues at the level of the  first metatarsal-phalangeal joint. Vascular calcifications are noted. No osseous destruction, sclerosis, fracture, or  dislocation. No new radiopaque foreign body.  IMPRESSION: New soft tissue gas and swelling within the plantar surface of the foot subjacent to the first metatarsal-phalangeal joint. This predisposes the patient to risk for osteomyelitis.   Electronically Signed   By: Conchita Paris M.D.   On: 07/24/2014 19:31   Dg Foot Complete Right  07/16/2014   CLINICAL DATA:  Acute right foot pain for 1 day. No known injury. Initial encounter.  EXAM: RIGHT FOOT COMPLETE - 3+ VIEW  COMPARISON:  July 13, 2014.  FINDINGS: There is no evidence of fracture or dislocation. There is no evidence of arthropathy or other focal bone abnormality. Vascular calcifications are noted. No radiopaque foreign body seen.  IMPRESSION: No significant abnormality seen involving the right foot.   Electronically Signed   By: Sabino Dick M.D.   On: 07/16/2014 12:52   Dg Foot Complete Right  07/13/2014   CLINICAL DATA:  81 year old male with a history of wound on the first metatarsophalangeal joint.  EXAM: RIGHT FOOT COMPLETE - 3+ VIEW  COMPARISON:  None.  FINDINGS: No acute fracture line identified.  No bony erosions identified.  No significant soft tissue swelling.  No subcutaneous gas/emphysema.  Extensive calcifications of the anterior tibial artery and posterior tibial artery, as well as the small vessels of the foot.  Degenerative changes of the forefoot, midfoot, and hindfoot.  IMPRESSION: No acute bony abnormality identified.  Extensive vascular calcifications compatible with infrapopliteal and small vessel disease. If the patient has not yet been evaluated for claudication/CLI, noninvasive testing with ABI, segmental duplex, and segmental pulse volume recording may be considered as well as office based evaluation.  Signed,  Dulcy Fanny. Earleen Newport, DO  [facility]  [facility]   Electronically Signed   By: Corrie Mckusick D.O.   On: 07/13/2014 00:30    Microbiology: Recent Results (from the past  240 hour(s))  Culture, blood (routine x 2)     Status: None (Preliminary result)   Collection Time: 07/24/14  6:30 PM  Result Value Ref Range Status   Specimen Description BLOOD RIGHT ARM  Final   Special Requests   Final    BOTTLES DRAWN AEROBIC AND ANAEROBIC 4CC BLUE [place] RED   Culture   Final           BLOOD CULTURE RECEIVED NO GROWTH TO DATE CULTURE WILL BE HELD FOR 5 DAYS BEFORE ISSUING A FINAL NEGATIVE REPORT Note: Culture results may be compromised due to an excessive volume of blood received in culture bottles. Performed at [facility]    Report Status PENDING  Incomplete  Culture, blood (routine x 2)     Status: None (Preliminary result)   Collection Time: 07/24/14  6:34 PM  Result Value Ref Range Status   Specimen Description BLOOD LEFT ARM  Final   Special Requests BOTTLES DRAWN AEROBIC AND ANAEROBIC 10CC  Final   Culture   Final           BLOOD CULTURE RECEIVED NO GROWTH TO DATE CULTURE WILL BE HELD FOR 5 DAYS BEFORE ISSUING A FINAL NEGATIVE REPORT Note: Culture results may be compromised due to an inadequate volume of blood received in culture bottles. Performed at [facility]    Report Status PENDING  Incomplete  Wound culture     Status: None   Collection Time: 07/24/14  6:40 PM  Result Value Ref Range  Status   Specimen Description WOUND RIGHT FOOT  Final   Special Requests NONE  Final   Gram Stain   Final    NO WBC SEEN NO SQUAMOUS EPITHELIAL CELLS SEEN FEW GRAM POSITIVE COCCI IN PAIRS Performed at [facility]    Culture   Final    ABUNDANT STAPHYLOCOCCUS AUREUS Note: RIFAMPIN AND GENTAMICIN SHOULD NOT BE USED AS SINGLE DRUGS FOR TREATMENT OF STAPH INFECTIONS. Performed at [facility]    Report Status 07/27/2014 FINAL  Final   Organism ID, Bacteria STAPHYLOCOCCUS AUREUS  Final      Susceptibility   Staphylococcus aureus - MIC*    CLINDAMYCIN <=0.25 SENSITIVE Sensitive     ERYTHROMYCIN <=0.25 SENSITIVE Sensitive      GENTAMICIN <=0.5 SENSITIVE Sensitive     LEVOFLOXACIN 0.25 SENSITIVE Sensitive     OXACILLIN 0.5 SENSITIVE Sensitive     PENICILLIN >=0.5 RESISTANT Resistant     RIFAMPIN <=0.5 SENSITIVE Sensitive     TRIMETH/SULFA <=10 SENSITIVE Sensitive     VANCOMYCIN 1 SENSITIVE Sensitive     TETRACYCLINE >=16 RESISTANT Resistant     MOXIFLOXACIN <=0.25 SENSITIVE Sensitive     * ABUNDANT STAPHYLOCOCCUS AUREUS     Labs: Basic Metabolic Panel:  Recent Labs Lab 07/26/14 0540 07/27/14 0451 07/27/14 1606 07/28/14 0556 07/29/14 0615  NA 135 133* 135 136 131*  K 4.4 4.0 4.2 3.7 4.1  CL 98 98 97 103 97  CO2 32 27 27 21 25   GLUCOSE 203* 278* 150* 63* 429*  BUN 26* 29* 28* 26* 28*  CREATININE 1.22 1.37* 1.26 1.16 1.13  CALCIUM 8.4 7.9* 8.2* 8.2* 7.4*   Liver Function Tests:  Recent Labs Lab 07/27/14 0451  ALBUMIN 2.6*   No results for input(s): LIPASE, AMYLASE in the last 168 hours. No results for input(s): AMMONIA in the last 168 hours. CBC:  Recent Labs Lab 07/27/14 0451 07/28/14 0556 07/29/14 0615 07/30/14 0148 07/31/14 0438  WBC 12.6* 15.6* 13.3* 15.2* 12.2*  HGB 11.0* 11.2* 10.9* 10.3* 9.9*  HCT 32.8* 36.7* 32.4* 30.5* 29.3*  MCV 91.9 91.8 92.3 91.0 92.4  PLT 345 405* 384 406* 378   Cardiac Enzymes: No results for input(s): CKTOTAL, CKMB, CKMBINDEX, TROPONINI in the last 168 hours. BNP: BNP (last 3 results)  Recent Labs  07/12/14 2121  BNP 315.7*    ProBNP (last 3 results)  Recent Labs  02/12/14 0728 03/17/14 1030 03/18/14 0320  PROBNP 9440.0* 5030.0* 7135.0*    CBG:  Recent Labs Lab 07/30/14 1733 07/30/14 2120 07/31/14 0745 07/31/14 1145 07/31/14 1728  GLUCAP 101* 225* 204* 259* 102*    Time coordinating discharge: 35 minutes  Signed:  Lynzi Meulemans  [facility] 08/01/2014, 8:19 AM

## 2014-08-02 NOTE — Consult Note (Signed)
[name], [name] 81 y.o., male [id number]     Chief Complaint: Tracheo-esophageal prosthesis issues  HPI: 81 yo wm well known to me.  Had a distant total laryngectomy for cancer.  Has used a tracheo-esophageal prosthesis (TEP) for voice production for many years.  Has been going to [facility], Dept of Otolaryngology Speech Pathologist to have the prosthesis changed for years.  Now, hospitalized for RIGHT foot necrotic wound at [facility].  Family interested in having prosthesis changed because it is time.  Not clear that he is having any trouble with it.  Still able to voice with finger occlusion of the stoma.  Family reportedly concerned that the TEP issue may be contributing to some delirium/confusion.  Hospitalists noted some leakage through or around the prosthesis with clear liquids last week.   No discomfort.  PMH: Past Medical History  Diagnosis Date  . Coronary artery disease     s/p CABG 1996 then PCI of SVG to OM 2007  . Diverticulosis   . Dyslipidemia   . Renal artery stenosis     s/p bilateral stents  . Hiatal hernia   . Colon polyps   . Carotid artery stenosis     50% BILATERAL 2014  . Peripheral neuropathy   . Anemia   . Hypothyroidism   . Nasal polyps   . Hypertension   . Diabetes mellitus   . Throat cancer S/P TRACHECTOMY   . Myocardial infarction   . Peripheral vascular disease     LOWER EXT  . Atrial fibrillation/flutter Aug 2015  . GERD (gastroesophageal reflux disease)   . Shortness of breath     Hx: of  . HOH (hard of hearing) VERY   . Left bundle branch block (LBBB) on electrocardiogram 03/17/2014  . Ischemic cardiomyopathy Aug 2015    EF 25-30%   . History of throat cancer     s/p trach  . Aortic stenosis Aug 2015    mild to moderate  . Chronic anticoagulation Aug 2015    Eliquis  . CHF (congestive heart failure)     Surg Hx: Past Surgical History  Procedure Laterality Date  . Coronary artery bypass graft      1996  . Cardiac  catheterization      PCI of SVG to OM 2007  . Renal artery stent    . Tracheostomy    . Appendectomy    . Tonsillectomy    . Sp pta peripheral  March 2013    Lt SFA (Dr Saundra Shelling)  . Sp pta peripheral  May 2013    Rt SFA (Dr Bridgett Larsson)  . Lower extremity angiogram [name] 08/23/2011    Procedure: LOWER EXTREMITY ANGIOGRAM;  Surgeon: [name], MD;  Location: [facility] CATH LAB;  Service: Cardiovascular;  Laterality: [name];  . Percutaneous stent intervention Left 08/23/2011    Procedure: PERCUTANEOUS STENT INTERVENTION;  Surgeon: [name], MD;  Location: [facility] CATH LAB;  Service: Cardiovascular;  Laterality: Left;  . Lower extremity angiogram [name] 10/25/2011    Procedure: LOWER EXTREMITY ANGIOGRAM;  Surgeon: Conrad Meadowood, MD;  Location: [facility] CATH LAB;  Service: Cardiovascular;  Laterality: [name];  . Left heart catheterization with coronary/graft angiogram [name] 11/07/2012    Procedure: LEFT HEART CATHETERIZATION WITH Beatrix Fetters;  Surgeon: [name], MD;  Location: [facility] CATH LAB;  Service: Cardiovascular;  Laterality: [name];  . Cardioversion [name] 03/18/2014    Procedure: CARDIOVERSION;  Surgeon: Evans Lance, MD;  Location: [facility] CATH LAB;  Service: Cardiovascular;  Laterality: [name];    FHx:   Family History  Problem Relation Age of Onset  . Hypertension Mother   . Heart attack Brother   . Diabetes Son    SocHx:  reports that he quit smoking about 68 years ago. His smoking use included Cigarettes. He smoked 0.30 packs per day. He has never used smokeless tobacco. He reports that he does not drink alcohol or use illicit drugs.  ALLERGIES: No Known Allergies  Medications Prior to Admission  Medication Sig Dispense Refill  . acarbose (PRECOSE) 100 MG tablet Take 100 mg by mouth 3 (three) times daily.    Marland Kitchen amiodarone (PACERONE) 400 MG tablet Take 200 mg by mouth daily.    Marland Kitchen apixaban (ELIQUIS) 2.5 MG TABS tablet Take 2.5 mg by mouth 2 (two) times daily.    Marland Kitchen aspirin EC 81 MG tablet Take 81  mg by mouth 2 (two) times a week. Mondays and thursdays    . atorvastatin (LIPITOR) 10 MG tablet Take 10 mg by mouth daily.    . carvedilol (COREG) 6.25 MG tablet Take 6.25 mg by mouth 2 (two) times daily with a meal.    . ferrous sulfate 325 (65 FE) MG tablet Take 325 mg by mouth daily with breakfast.    . finasteride (PROSCAR) 5 MG tablet Take 5 mg by mouth daily.     . furosemide (LASIX) 20 MG tablet Take 20 mg by mouth daily.    Marland Kitchen glipiZIDE (GLUCOTROL) 10 MG tablet Take 20 mg by mouth 2 (two) times daily before a meal.     . levothyroxine (SYNTHROID, LEVOTHROID) 100 MCG tablet Take 100 mcg by mouth daily before breakfast.    . lisinopril (PRINIVIL,ZESTRIL) 5 MG tablet Take 5 mg by mouth daily.    . metFORMIN (GLUCOPHAGE) 500 MG tablet Take 500 mg by mouth daily.    . methylPREDNISolone (MEDROL DOSEPAK) 4 MG tablet follow package directions 21 tablet 0  . potassium chloride SA (K-DUR,KLOR-CON) 20 MEQ tablet Take 20 mEq by mouth 2 (two) times daily.    Marland Kitchen senna-docusate (SENOKOT-S) 8.6-50 MG per tablet Take 1 tablet by mouth at bedtime as needed for mild constipation. 20 tablet 0  . traMADol (ULTRAM) 50 MG tablet Take 1 tablet (50 mg total) by mouth every 6 (six) hours as needed. 15 tablet 0  . [DISCONTINUED] fluticasone (FLONASE) 50 MCG/ACT nasal spray Place 2 sprays into both nostrils daily.  11    Results for orders placed or performed during the hospital encounter of 07/24/14 (from the past 48 hour(s))  Glucose, capillary     Status: Abnormal   Collection Time: 07/31/14 11:45 AM  Result Value Ref Range   Glucose-Capillary 259 (H) 70 - 99 mg/dL  Glucose, capillary     Status: Abnormal   Collection Time: 07/31/14  5:28 PM  Result Value Ref Range   Glucose-Capillary 102 (H) 70 - 99 mg/dL   No results found.    Blood pressure 158/55, pulse 72, temperature 99.9 F (37.7 C), temperature source Oral, resp. rate 20, height 5\' 8"  (1.727 m), weight 63.4 kg (139 lb 12.4 oz), SpO2 98  %.  PHYSICAL EXAM: Overall appearance:  Thin, alert.  Clearly confused.  Able to voice coarsely with finger occlusion of stoma.   Head:  NCAT Ears:  Not examined Nose:  Not examined Oral Cavity:  Not examined Oral Pharynx/Hypopharynx/Larynx: not examined Neck:  Thin.  Clean, well healed laryngectomy stoma.  Mod crusted prosthesis in  good position.  No visible leakage when drinking water.   Neuro:  Grossly intact except for mentation    Assessment/Plan Satisfactory check of indwelling TEP.  No evidence of significant aspiration.  It is still functional.  I do not feel this is contributing to his confusion in any way.  At this point, noting his transition to palliative care, I would allow him a full regular diet as desired.  Changing the prosthesis is appropriate at some point, but not sure at this time it is worth the trouble to transport him to [facility] for this given that he is having minimal problems with it.    Please call for further assistance.  Jodi Marble 8/59/2924, 8:32 AM

## 2014-08-02 NOTE — Progress Notes (Signed)
Addendum to discharge summary from 2/14.  Patient is comfortable.  No complaints today.  Right foot unchanged from yesterday.  Patient stable for transfer to SNF for hospice care once bed available.

## 2014-08-02 NOTE — Clinical Social Work Note (Addendum)
Patient to be d/c'ed today to [facility] for SNF with hospice.  Patient and family agreeable to plans will transport via ems RN to call report.  Patient's family made aware of patient's transfer to SNF.  Patient informed that he will be going to [facility] via ambulance and his family are aware that he will be there.  Patient was confused and asking if his son was coming here to take him to [facility] [place] informed him that his son will see him when he gets there.  [name], MSW, [place]

## 2014-08-02 NOTE — Progress Notes (Signed)
Report called and given to Terese Door, LPN at [facility]. Pt will be ready for discharge when non-emergent transport arrives.

## 2014-09-17 DEATH — deceased

## 2014-09-20 ENCOUNTER — Ambulatory Visit: Payer: Medicare Other

## 2014-09-28 ENCOUNTER — Ambulatory Visit: Payer: Medicare Other | Admitting: Interventional Cardiology

## 2015-09-23 IMAGING — DX DG FOOT COMPLETE 3+V*R*
3 series · 3 of 3 positions shown · non-contrast
Comparison: 07/16/2014

CLINICAL DATA: Right foot pain, plantar surface ulcer beneath the
great toe

EXAM:
RIGHT FOOT COMPLETE - 3+ VIEW

[foot ap]
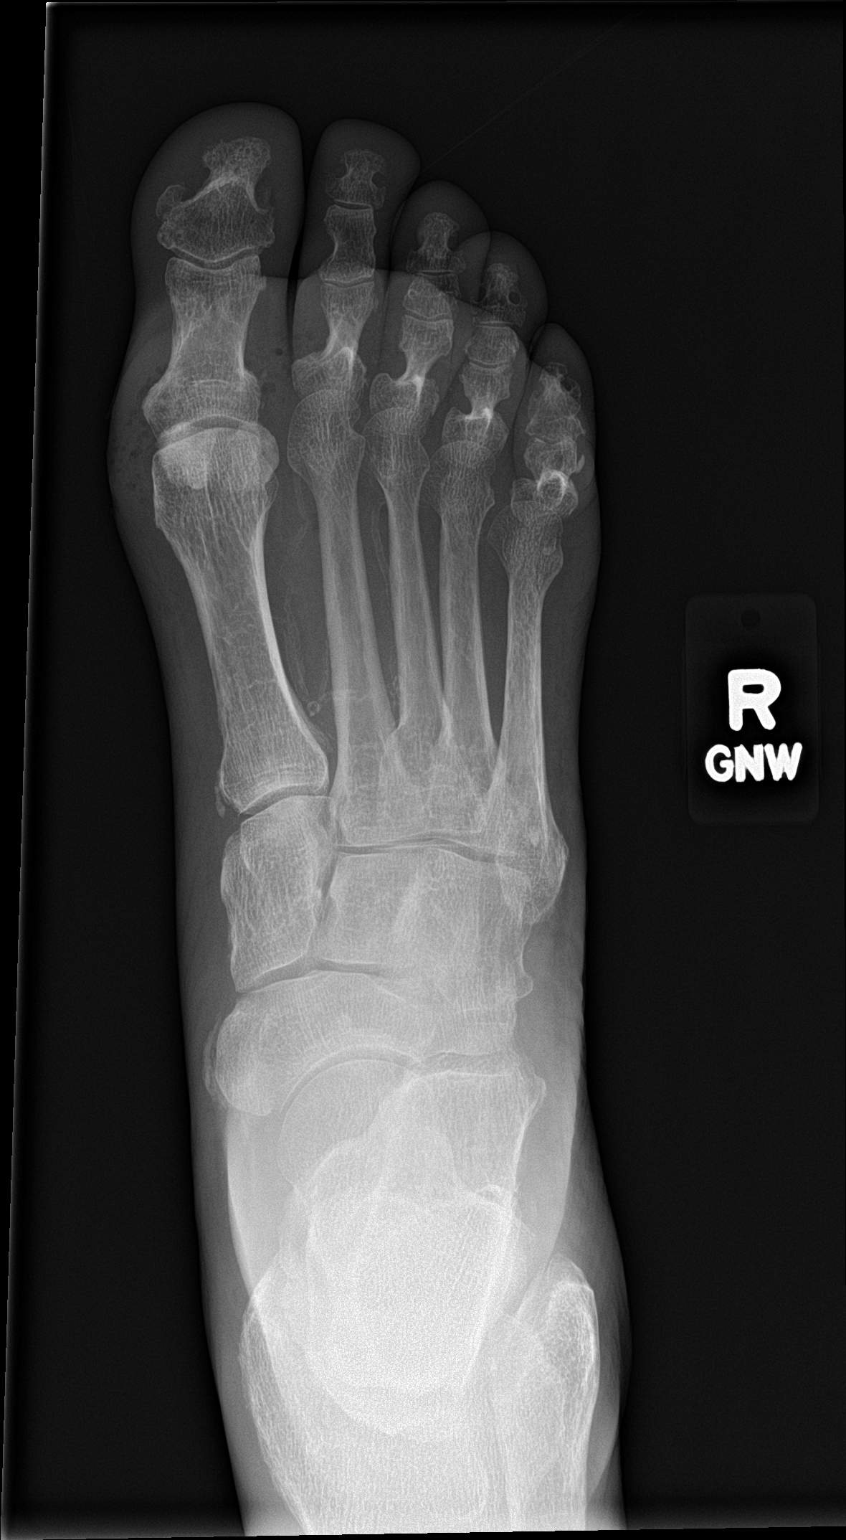

[foot obl]
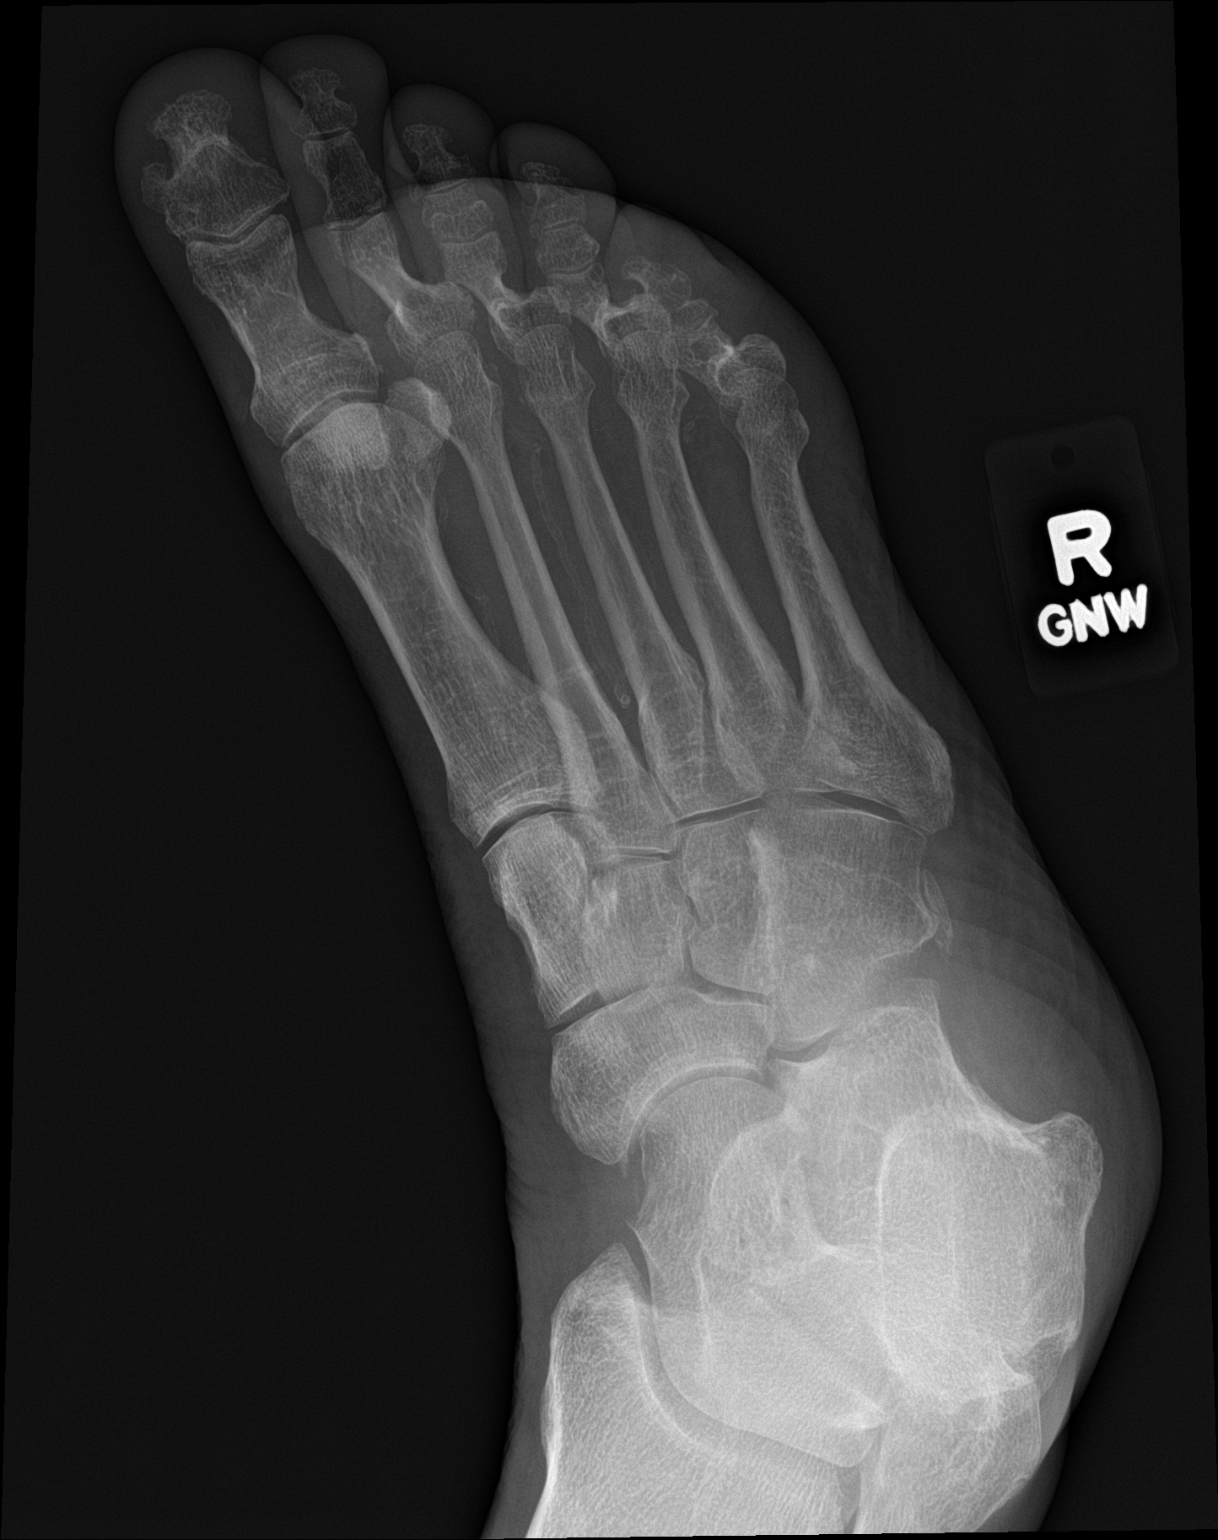

[foot lat]
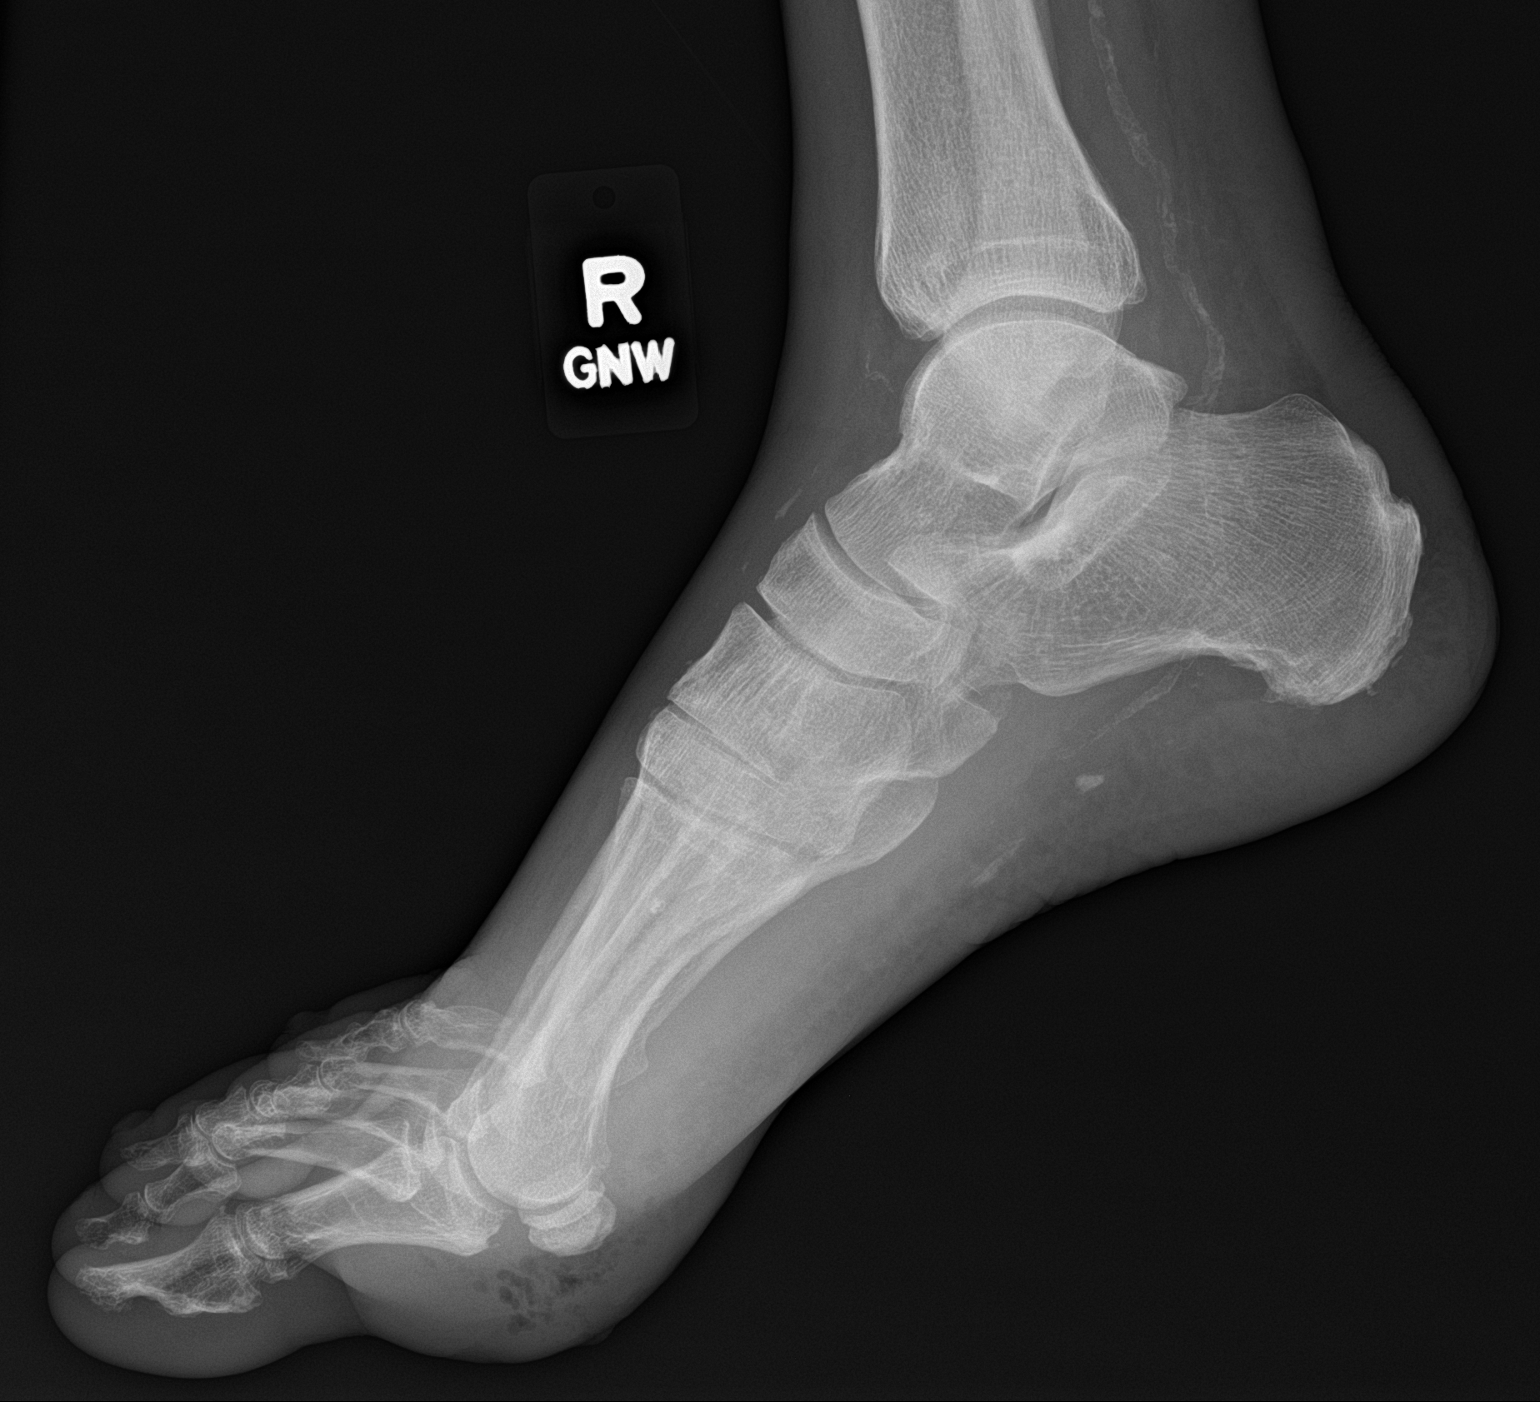

[3 of 3 positions shown; findings below may reference images not displayed]

FINDINGS: There has been interval development of soft tissue gas and swelling
in the plantar soft tissues at the level of the first
metatarsal-phalangeal joint. Vascular calcifications are noted. No
osseous destruction, sclerosis, fracture, or dislocation. No new
radiopaque foreign body.
IMPRESSION: New soft tissue gas and swelling within the plantar surface of the
foot subjacent to the first metatarsal-phalangeal joint. This
predisposes the patient to risk for osteomyelitis.
